# Patient Record
Sex: Female | Born: 1950
Health system: Southern US, Community
[De-identification: ages and names within clinical notes are randomized; demographics above are authoritative.]

## PROBLEM LIST (undated history)

## (undated) DIAGNOSIS — M1990 Unspecified osteoarthritis, unspecified site: Secondary | ICD-10-CM

## (undated) DIAGNOSIS — I10 Essential (primary) hypertension: Secondary | ICD-10-CM

## (undated) DIAGNOSIS — T7840XA Allergy, unspecified, initial encounter: Secondary | ICD-10-CM

## (undated) DIAGNOSIS — B029 Zoster without complications: Secondary | ICD-10-CM

## (undated) DIAGNOSIS — Z923 Personal history of irradiation: Secondary | ICD-10-CM

## (undated) DIAGNOSIS — Z86010 Personal history of colonic polyps: Secondary | ICD-10-CM

## (undated) DIAGNOSIS — M7120 Synovial cyst of popliteal space [Baker], unspecified knee: Secondary | ICD-10-CM

## (undated) DIAGNOSIS — E739 Lactose intolerance, unspecified: Secondary | ICD-10-CM

## (undated) DIAGNOSIS — C50919 Malignant neoplasm of unspecified site of unspecified female breast: Secondary | ICD-10-CM

## (undated) HISTORY — PX: ABDOMINAL HYSTERECTOMY: SHX81

## (undated) HISTORY — DX: Allergy, unspecified, initial encounter: T78.40XA

## (undated) HISTORY — DX: Zoster without complications: B02.9

## (undated) HISTORY — DX: Essential (primary) hypertension: I10

## (undated) HISTORY — DX: Synovial cyst of popliteal space (Baker), unspecified knee: M71.20

## (undated) HISTORY — DX: Unspecified osteoarthritis, unspecified site: M19.90

## (undated) HISTORY — DX: Personal history of colonic polyps: Z86.010

## (undated) HISTORY — DX: Lactose intolerance, unspecified: E73.9

## (undated) HISTORY — PX: OTHER SURGICAL HISTORY: SHX169

## (undated) HISTORY — DX: Malignant neoplasm of unspecified site of unspecified female breast: C50.919

---

## 1998-05-17 ENCOUNTER — Emergency Department (HOSPITAL_COMMUNITY): Admission: EM | Admit: 1998-05-17 | Discharge: 1998-05-17 | Payer: Self-pay | Admitting: Emergency Medicine

## 1999-02-18 ENCOUNTER — Ambulatory Visit (HOSPITAL_COMMUNITY): Admission: RE | Admit: 1999-02-18 | Discharge: 1999-02-18 | Payer: Self-pay | Admitting: Obstetrics

## 1999-02-18 ENCOUNTER — Encounter: Payer: Self-pay | Admitting: Internal Medicine

## 2000-02-23 ENCOUNTER — Encounter: Payer: Self-pay | Admitting: Internal Medicine

## 2000-02-23 ENCOUNTER — Ambulatory Visit (HOSPITAL_COMMUNITY): Admission: RE | Admit: 2000-02-23 | Discharge: 2000-02-23 | Payer: Self-pay | Admitting: Internal Medicine

## 2001-02-26 ENCOUNTER — Encounter: Payer: Self-pay | Admitting: Internal Medicine

## 2001-02-26 ENCOUNTER — Ambulatory Visit (HOSPITAL_COMMUNITY): Admission: RE | Admit: 2001-02-26 | Discharge: 2001-02-26 | Payer: Self-pay | Admitting: Internal Medicine

## 2002-02-27 ENCOUNTER — Encounter: Payer: Self-pay | Admitting: Internal Medicine

## 2002-02-27 ENCOUNTER — Ambulatory Visit (HOSPITAL_COMMUNITY): Admission: RE | Admit: 2002-02-27 | Discharge: 2002-02-27 | Payer: Self-pay | Admitting: Internal Medicine

## 2003-03-03 ENCOUNTER — Encounter: Payer: Self-pay | Admitting: Internal Medicine

## 2003-03-03 ENCOUNTER — Ambulatory Visit (HOSPITAL_COMMUNITY): Admission: RE | Admit: 2003-03-03 | Discharge: 2003-03-03 | Payer: Self-pay | Admitting: Geriatric Medicine

## 2004-03-08 ENCOUNTER — Ambulatory Visit (HOSPITAL_COMMUNITY): Admission: RE | Admit: 2004-03-08 | Discharge: 2004-03-08 | Payer: Self-pay | Admitting: Internal Medicine

## 2004-11-10 ENCOUNTER — Ambulatory Visit: Payer: Self-pay | Admitting: Internal Medicine

## 2005-03-31 ENCOUNTER — Ambulatory Visit (HOSPITAL_COMMUNITY): Admission: RE | Admit: 2005-03-31 | Discharge: 2005-03-31 | Payer: Self-pay | Admitting: Internal Medicine

## 2005-10-20 ENCOUNTER — Ambulatory Visit: Payer: Self-pay | Admitting: Internal Medicine

## 2005-10-26 ENCOUNTER — Ambulatory Visit: Payer: Self-pay | Admitting: Internal Medicine

## 2005-11-11 ENCOUNTER — Emergency Department (HOSPITAL_COMMUNITY): Admission: EM | Admit: 2005-11-11 | Discharge: 2005-11-11 | Payer: Self-pay | Admitting: Family Medicine

## 2005-11-21 ENCOUNTER — Ambulatory Visit: Payer: Self-pay | Admitting: Internal Medicine

## 2005-11-28 ENCOUNTER — Ambulatory Visit: Payer: Self-pay | Admitting: Internal Medicine

## 2005-12-11 ENCOUNTER — Encounter (INDEPENDENT_AMBULATORY_CARE_PROVIDER_SITE_OTHER): Payer: Self-pay | Admitting: Specialist

## 2005-12-11 ENCOUNTER — Ambulatory Visit: Payer: Self-pay | Admitting: Internal Medicine

## 2005-12-11 HISTORY — PX: COLONOSCOPY: SHX174

## 2006-04-02 ENCOUNTER — Ambulatory Visit (HOSPITAL_COMMUNITY): Admission: RE | Admit: 2006-04-02 | Discharge: 2006-04-02 | Payer: Self-pay | Admitting: Internal Medicine

## 2006-08-10 ENCOUNTER — Ambulatory Visit: Payer: Self-pay | Admitting: Internal Medicine

## 2006-10-12 ENCOUNTER — Ambulatory Visit: Payer: Self-pay | Admitting: Internal Medicine

## 2007-04-04 ENCOUNTER — Ambulatory Visit (HOSPITAL_COMMUNITY): Admission: RE | Admit: 2007-04-04 | Discharge: 2007-04-04 | Payer: Self-pay | Admitting: Internal Medicine

## 2007-06-06 ENCOUNTER — Ambulatory Visit: Payer: Self-pay | Admitting: Internal Medicine

## 2007-06-07 ENCOUNTER — Telehealth: Payer: Self-pay | Admitting: Internal Medicine

## 2007-06-07 LAB — CONVERTED CEMR LAB
BUN: 9 mg/dL (ref 6–23)
Creatinine, Ser: 0.8 mg/dL (ref 0.4–1.2)
Potassium: 3.6 meq/L (ref 3.5–5.1)

## 2007-08-19 ENCOUNTER — Emergency Department (HOSPITAL_COMMUNITY): Admission: EM | Admit: 2007-08-19 | Discharge: 2007-08-19 | Payer: Self-pay | Admitting: Family Medicine

## 2008-01-24 ENCOUNTER — Ambulatory Visit: Payer: Self-pay | Admitting: Internal Medicine

## 2008-01-24 DIAGNOSIS — I10 Essential (primary) hypertension: Secondary | ICD-10-CM | POA: Insufficient documentation

## 2008-03-26 ENCOUNTER — Telehealth (INDEPENDENT_AMBULATORY_CARE_PROVIDER_SITE_OTHER): Payer: Self-pay | Admitting: *Deleted

## 2008-04-06 ENCOUNTER — Ambulatory Visit (HOSPITAL_COMMUNITY): Admission: RE | Admit: 2008-04-06 | Discharge: 2008-04-06 | Payer: Self-pay | Admitting: Internal Medicine

## 2008-04-09 ENCOUNTER — Ambulatory Visit: Payer: Self-pay | Admitting: Internal Medicine

## 2008-04-09 LAB — CONVERTED CEMR LAB: OCCULT 1: NEGATIVE

## 2008-05-01 ENCOUNTER — Telehealth (INDEPENDENT_AMBULATORY_CARE_PROVIDER_SITE_OTHER): Payer: Self-pay | Admitting: *Deleted

## 2008-05-04 ENCOUNTER — Telehealth (INDEPENDENT_AMBULATORY_CARE_PROVIDER_SITE_OTHER): Payer: Self-pay | Admitting: *Deleted

## 2008-06-18 ENCOUNTER — Emergency Department (HOSPITAL_COMMUNITY): Admission: EM | Admit: 2008-06-18 | Discharge: 2008-06-18 | Payer: Self-pay | Admitting: Emergency Medicine

## 2008-10-24 DIAGNOSIS — B029 Zoster without complications: Secondary | ICD-10-CM

## 2008-10-24 HISTORY — DX: Zoster without complications: B02.9

## 2008-10-28 ENCOUNTER — Ambulatory Visit: Payer: Self-pay | Admitting: Internal Medicine

## 2008-10-28 DIAGNOSIS — Z8619 Personal history of other infectious and parasitic diseases: Secondary | ICD-10-CM | POA: Insufficient documentation

## 2008-10-29 ENCOUNTER — Telehealth (INDEPENDENT_AMBULATORY_CARE_PROVIDER_SITE_OTHER): Payer: Self-pay | Admitting: *Deleted

## 2008-11-03 ENCOUNTER — Telehealth (INDEPENDENT_AMBULATORY_CARE_PROVIDER_SITE_OTHER): Payer: Self-pay | Admitting: *Deleted

## 2008-11-05 ENCOUNTER — Telehealth (INDEPENDENT_AMBULATORY_CARE_PROVIDER_SITE_OTHER): Payer: Self-pay | Admitting: *Deleted

## 2008-11-10 ENCOUNTER — Telehealth (INDEPENDENT_AMBULATORY_CARE_PROVIDER_SITE_OTHER): Payer: Self-pay | Admitting: *Deleted

## 2009-04-07 ENCOUNTER — Ambulatory Visit (HOSPITAL_COMMUNITY): Admission: RE | Admit: 2009-04-07 | Discharge: 2009-04-07 | Payer: Self-pay | Admitting: Internal Medicine

## 2009-04-14 ENCOUNTER — Encounter: Admission: RE | Admit: 2009-04-14 | Discharge: 2009-04-14 | Payer: Self-pay | Admitting: Internal Medicine

## 2009-10-27 ENCOUNTER — Encounter (INDEPENDENT_AMBULATORY_CARE_PROVIDER_SITE_OTHER): Payer: Self-pay | Admitting: *Deleted

## 2009-11-18 ENCOUNTER — Ambulatory Visit: Payer: Self-pay | Admitting: Internal Medicine

## 2009-11-18 DIAGNOSIS — J309 Allergic rhinitis, unspecified: Secondary | ICD-10-CM | POA: Insufficient documentation

## 2009-11-18 DIAGNOSIS — E739 Lactose intolerance, unspecified: Secondary | ICD-10-CM | POA: Insufficient documentation

## 2010-04-07 ENCOUNTER — Ambulatory Visit: Payer: Self-pay | Admitting: Internal Medicine

## 2010-04-07 DIAGNOSIS — K219 Gastro-esophageal reflux disease without esophagitis: Secondary | ICD-10-CM | POA: Insufficient documentation

## 2010-04-07 DIAGNOSIS — R143 Flatulence: Secondary | ICD-10-CM

## 2010-04-07 DIAGNOSIS — R141 Gas pain: Secondary | ICD-10-CM | POA: Insufficient documentation

## 2010-04-07 DIAGNOSIS — R142 Eructation: Secondary | ICD-10-CM

## 2010-04-22 ENCOUNTER — Ambulatory Visit (HOSPITAL_COMMUNITY): Admission: RE | Admit: 2010-04-22 | Discharge: 2010-04-22 | Payer: Self-pay | Admitting: Internal Medicine

## 2010-04-28 ENCOUNTER — Encounter: Payer: Self-pay | Admitting: Internal Medicine

## 2010-05-05 ENCOUNTER — Encounter: Admission: RE | Admit: 2010-05-05 | Discharge: 2010-05-05 | Payer: Self-pay | Admitting: Internal Medicine

## 2010-07-16 ENCOUNTER — Encounter: Payer: Self-pay | Admitting: Internal Medicine

## 2010-07-17 ENCOUNTER — Encounter: Payer: Self-pay | Admitting: Internal Medicine

## 2010-07-26 NOTE — Assessment & Plan Note (Signed)
Summary: bloated/gas/cbs   Vital Signs:  Patient profile:   60 year old female Weight:      181.8 pounds BMI:     29.23 Temp:     98.1 degrees F oral Pulse rate:   64 / minute Resp:     15 per minute BP sitting:   124 / 80  (left arm) Cuff size:   large  Vitals Entered By: Shonna Chock CMA (April 07, 2010 2:02 PM) CC: Bloating and Gas, Abdominal pain, Heartburn   CC:  Bloating and Gas, Abdominal pain, and Heartburn.  History of Present Illness:      This is a 60 year old woman who presents with bloating  2 weeks ago followed by  residual " gas  & indigestion"  X 2 weeks intermittently.  The patient denies nausea, vomiting, diarrhea, constipation, melena, anorexia, and hematemesis.  The location of the bloating  is in lower quadrants with back radiation. It will also radiate into  epigastrium & SS area. Weight loss has ben purposeful with CVE & W Ws.  The patient denies the following symptoms: fever, dysuria, chest pain, dark urine, and vaginal bleeding.  The bloating is worse with food intake late @ night.  The pain is  no better with antacids or Zantac OTC. PMH of TAH; ovaries remain. Last Gyn appt 2-3 years ago.  The patient denies sour taste in mouth or frank ERD . Gas symptoms  worse with salads & dairy .  Current Medications (verified): 1)  Cartia Xt 120 Mg  Cp24 (Diltiazem Hcl Coated Beads) .Marland Kitchen.. 1 Q D  Allergies: 1)  ! * Septra Ds  Review of Systems ENT:  Denies difficulty swallowing and hoarseness.  Physical Exam  General:  in no acute distress; alert,appropriate and cooperative throughout examination Eyes:  No corneal or conjunctival inflammation noted. No icterus Mouth:  Oral mucosa and oropharynx without lesions or exudates.  Mild pharyngeal erythema.   Lungs:  Normal respiratory effort, chest expands symmetrically. Lungs are clear to auscultation, no crackles or wheezes. Heart:  Normal rate and regular rhythm. S1 and S2 normal without gallop, murmur, click, rub .S 4  with slurring Abdomen:  Bowel sounds positive,abdomen soft and non-tender without masses, organomegaly or hernias noted. Skin:  Intact without suspicious lesions or rashes Cervical Nodes:  No lymphadenopathy noted Axillary Nodes:  No palpable lymphadenopathy Psych:  memory intact for recent and remote, normally interactive, and good eye contact.     Impression & Recommendations:  Problem # 1:  GERD (ICD-530.81)  as "gas"  Her updated medication list for this problem includes:    Omeprazole 20 Mg Cpdr (Omeprazole) .Marland Kitchen... 1 once daily 30 min pre b'fast    Hyoscyamine Sulfate 0.125 Mg Subl (Hyoscyamine sulfate) .Marland Kitchen... 1 under tongue every 6 hrs as needed for gas  Problem # 2:  ABDOMINAL BLOATING (ICD-787.3) resolved  Complete Medication List: 1)  Cartia Xt 120 Mg Cp24 (Diltiazem hcl coated beads) .Marland Kitchen.. 1 q d 2)  Omeprazole 20 Mg Cpdr (Omeprazole) .Marland Kitchen.. 1 once daily 30 min pre b'fast 3)  Hyoscyamine Sulfate 0.125 Mg Subl (Hyoscyamine sulfate) .Marland Kitchen.. 1 under tongue every 6 hrs as needed for gas  Patient Instructions: 1)  Avoid foods high in acid (tomatoes, citrus juices, spicy foods). Avoid eating within two hours of lying down or before exercising. Do not over eat; try smaller more frequent meals. Elevate head of bed twelve inches when sleeping.Please see a Gynecologist to monitor ovarian health. Prescriptions: HYOSCYAMINE SULFATE 0.125  MG SUBL (HYOSCYAMINE SULFATE) 1 under tongue every 6 hrs as needed for gas  #30 x 0   Entered and Authorized by:   Marga Melnick MD   Signed by:   Marga Melnick MD on 04/07/2010   Method used:   Faxed to ...       [facility]* (retail)       [address]       [address] Shipping/mailing       [place], [place]  [address]       Ph: [phone]       Fax: [phone]   RxID:   [id number] OMEPRAZOLE 20 MG CPDR (OMEPRAZOLE) 1 once daily 30 min pre b'fast  #30 x 1   Entered and Authorized by:   Marga Melnick MD   Signed by:    Marga Melnick MD on 04/07/2010   Method used:   Faxed to ...       [facility]* (retail)       [address]       [address] Shipping/mailing       [place], [place]  [address]       Ph: [phone]       Fax: [phone]   RxID:   [id number]

## 2010-07-26 NOTE — Letter (Signed)
Summary: Primary Care Appointment Letter  [facility] [place]  [address] [place], [place] [address]   Phone: [phone]  Fax: [phone]    10/27/2009 MRN: [id number]  Angie Garcia [address] [place], [place]  [address]  Dear [name],   Your Primary Care Physician Marga Melnick MD has indicated that:    __X_____it is time to schedule an appointment.    _______you missed your appointment on______ and need to call and          reschedule.    _______you need to have lab work done.    _______you need to schedule an appointment discuss lab or test results.    _______you need to call to reschedule your appointment that is                       scheduled on _________.     Please call our office as soon as possible. Our phone number is [phone]. Please press option 1. Our office is open 8a-12noon and 1p-5p, Monday through Friday.     Thank you,    [facility]

## 2010-07-26 NOTE — Assessment & Plan Note (Signed)
Summary: med refill/cbs   Vital Signs:  Patient profile:   60 year old female Height:      66.25 inches Weight:      187.8 pounds BMI:     30.19 Temp:     97.4 degrees F oral Pulse rate:   60 / minute Resp:     14 per minute BP sitting:   126 / 80  (right arm) Cuff size:   large  Vitals Entered By: Shonna Chock (Nov 18, 2009 11:15 AM)  CC: CPX: Not fasting today, General Medical Evaluation Comments REVIEWED MED LIST, PATIENT AGREED DOSE AND INSTRUCTION CORRECT    CC:  CPX: Not fasting today and General Medical Evaluation.  History of Present Illness: Angie Garcia is here for a physical ; she is asymptomatic.  Preventive Screening-Counseling & Management  Caffeine-Diet-Exercise     Does Patient Exercise: yes  Allergies: 1)  ! * Septra Ds  Past History:  Past Medical History: Hypertension Shingles , PMH of , LUE 10/2008; Lactose intolerance  Past Surgical History: Pilonidal cystectomy Hysterectomy for fibroids gravida one para one Colonoscopy negative 2005  Family History: Father: diabetes, mini CVA'S  Mother:htn, breast cancer Siblings: sister: cva @ 73, diabetes maternal uncle: cancer unknown primary  Social History: Former Smoker,quit age 41 Occupation:File Room Assembler/ Scheduler Married Alcohol use-no Regular exercise-yes: treadmill & walking Does Patient Exercise:  yes  Review of Systems  The patient denies anorexia, fever, weight loss, weight gain, vision loss, decreased hearing, hoarseness, chest pain, syncope, dyspnea on exertion, peripheral edema, prolonged cough, headaches, hemoptysis, abdominal pain, melena, hematochezia, severe indigestion/heartburn, hematuria, incontinence, suspicious skin lesions, depression, unusual weight change, abnormal bleeding, enlarged lymph nodes, and angioedema.         Bloating only with lactose intake. Extrinsic rhinitis ; Allegra as needed with benefit.  Physical Exam  General:  well-nourished;  alert,appropriate and cooperative throughout examination Head:  Normocephalic and atraumatic without obvious abnormalities.  Eyes:  No corneal or conjunctival inflammation noted.Perrla. Funduscopic exam benign, without hemorrhages, exudates or papilledema.  Ears:  External ear exam shows no significant lesions or deformities.  Otoscopic examination reveals clear canals, tympanic membranes are intact bilaterally without bulging, retraction, inflammation or discharge. Hearing is grossly normal bilaterally. Nose:  External nasal examination shows no deformity or inflammation. Nasal mucosa are pink and moist without lesions or exudates. Mouth:  Oral mucosa and oropharynx without lesions or exudates.  Upper plate Neck:  No deformities, masses, or tenderness noted. Lungs:  Normal respiratory effort, chest expands symmetrically. Lungs are clear to auscultation, no crackles or wheezes. Heart:  regular rhythm, no gallop, no rub, no JVD, no HJR, bradycardia, and grade 1 /6 systolic murmur.   Abdomen:  Bowel sounds positive,abdomen soft and non-tender without masses, organomegaly or hernias noted. Msk:  No deformity or scoliosis noted of thoracic or lumbar spine.   Pulses:  R and L carotid,radial,dorsalis pedis and posterior tibial pulses are full and equal bilaterally Extremities:  No clubbing, cyanosis, edema, or deformity noted with normal full range of motion of all joints.   Crepitus of knees , L > R Neurologic:  alert & oriented X3 and DTRs symmetrical  but 0-1/2+ @ knees Skin:  Intact without suspicious lesions or rashes Cervical Nodes:  No lymphadenopathy noted Axillary Nodes:  No palpable lymphadenopathy Psych:  memory intact for recent and remote, normally interactive, and good eye contact.     Impression & Recommendations:  Problem # 1:  ROUTINE GENERAL MEDICAL EXAM@HEALTH  CARE FACL (  ICD-V70.0)  Orders: EKG w/ Interpretation ([address])  Problem # 2:  HYPERTENSION  (ICD-401.9)  Controlled Her updated medication list for this problem includes:    Cartia Xt 120 Mg Cp24 (Diltiazem hcl coated beads) .Marland Kitchen... 1 q d  Orders: EKG w/ Interpretation ([address])  Problem # 3:  RHINITIS (ICD-477.9)  Her updated medication list for this problem includes:    Fluticasone Propionate 50 Mcg/act Susp (Fluticasone propionate) .Marland Kitchen... 1 spray two times a day as needed allergies  Problem # 4:  LACTOSE INTOLERANCE (ICD-271.3)  Complete Medication List: 1)  Cartia Xt 120 Mg Cp24 (Diltiazem hcl coated beads) .Marland Kitchen.. 1 q d 2)  Fluticasone Propionate 50 Mcg/act Susp (Fluticasone propionate) .Marland Kitchen.. 1 spray two times a day as needed allergies  Patient Instructions: 1)  Use a Neti pot once daily as needed for nasal congestion.Consider fasting labs; see Diagnoses for Codes: 2)  BMP prior to visit, ICD-9: 3)  Hepatic Panel prior to visit, ICD-9: 4)  Lipid Panel prior to visit, ICD-9: 5)  TSH prior to visit, ICD-9: 6)  CBC w/ Diff prior to visit, ICD-9: Prescriptions: FLUTICASONE PROPIONATE 50 MCG/ACT SUSP (FLUTICASONE PROPIONATE) 1 spray two times a day as needed allergies  #1 x 11   Entered and Authorized by:   Marga Melnick MD   Signed by:   Marga Melnick MD on 11/18/2009   Method used:   Faxed to ...       [facility]* (retail)       [address]       [address] Shipping/mailing       [place], [place]  [address]       Ph: [phone]       Fax: [phone]   RxID:   [id number] CARTIA XT 120 MG  CP24 (DILTIAZEM HCL COATED BEADS) 1 q d  #90 x 3   Entered and Authorized by:   Marga Melnick MD   Signed by:   Marga Melnick MD on 11/18/2009   Method used:   Faxed to ...       [facility]* (retail)       [address]       [address] Shipping/mailing       [place], [place]  [address]       Ph: [phone]       Fax: [phone]   RxID:   [id number]

## 2010-11-29 ENCOUNTER — Other Ambulatory Visit: Payer: Self-pay | Admitting: Internal Medicine

## 2010-12-20 ENCOUNTER — Other Ambulatory Visit (HOSPITAL_BASED_OUTPATIENT_CLINIC_OR_DEPARTMENT_OTHER): Payer: Self-pay | Admitting: Urology

## 2011-03-21 ENCOUNTER — Other Ambulatory Visit: Payer: Self-pay | Admitting: Internal Medicine

## 2011-03-21 DIAGNOSIS — Z1231 Encounter for screening mammogram for malignant neoplasm of breast: Secondary | ICD-10-CM

## 2011-03-29 ENCOUNTER — Encounter: Payer: Self-pay | Admitting: *Deleted

## 2011-03-29 ENCOUNTER — Encounter: Payer: Self-pay | Admitting: Internal Medicine

## 2011-03-29 ENCOUNTER — Ambulatory Visit (INDEPENDENT_AMBULATORY_CARE_PROVIDER_SITE_OTHER): Payer: 59 | Admitting: Internal Medicine

## 2011-03-29 VITALS — BP 138/98 | HR 67 | Temp 97.7°F | Wt 176.0 lb

## 2011-03-29 DIAGNOSIS — I10 Essential (primary) hypertension: Secondary | ICD-10-CM

## 2011-03-29 MED ORDER — LISINOPRIL 20 MG PO TABS: 20.0000 mg | ORAL_TABLET | Freq: Every day | ORAL | Status: DC

## 2011-03-29 NOTE — Progress Notes (Signed)
  Subjective:    Patient ID: Angie Garcia, female    DOB: [birth year], 60 y.o.   MRN: [id number]  HPI  HYPERTENSION: exacerbation in past 6 days  In context of decongestant for URI Disease Monitoring  Blood pressure range: 142/80-171/91  Chest pain: no   Dyspnea: no   Claudication: no   Medication compliance: yes  Medication Side Effects  Lightheadedness: no   Urinary frequency: no   Edema: no      Preventitive Healthcare:  Exercise: yes, 30 min daily   Diet Pattern: no plan   Salt Restriction: yes      Review of Systems   At this time she denies frontal headache, nasal purulence, facial pain, fever, chills or sweats.     Objective:   Physical Exam  She appears healthy and well-nourished  The nares are patent with no exudate. She has no significant oral pharyngeal erythema.  Chest is clear with no rhonchi or rales.  She exhibits a slow S4 with no murmurs or gallops.  She has no clubbing, edema or cyanosis.  There is no lymphadenopathy the head, neck or axilla.       Assessment & Plan:  #1 hypertension, possible exacerbation by decongestants taken for an upper respiratory infection. She is presently on a low dose calcium channel blocker  #2 recent upper respiratory tract symptoms without evidence of rhinosinusitis Plan: See orders and recommendations

## 2011-03-29 NOTE — Patient Instructions (Signed)
Your BP goal = AVERAGE < 135/85. Avoid ingestion of  excess salt/sodium.Cook with pepper & other spices . Use the salt substitute "No Salt"(unless your potassium has been elevated) OR the Mrs Sharilyn Sites products to season food @ the table. Avoid foods which taste salty or "vinegary" as their sodium contentet will be high. This AVERAGE should be calculated from @ least 5-7 BP readings taken @ different times of day on different days of week. You should not respond to isolated BP readings , but rather the AVERAGE for that week

## 2011-03-31 ENCOUNTER — Other Ambulatory Visit: Payer: Self-pay | Admitting: Internal Medicine

## 2011-03-31 DIAGNOSIS — Z1231 Encounter for screening mammogram for malignant neoplasm of breast: Secondary | ICD-10-CM

## 2011-04-28 ENCOUNTER — Ambulatory Visit
Admission: RE | Admit: 2011-04-28 | Discharge: 2011-04-28 | Disposition: A | Discharge status: Home / Self Care | Payer: 59 | Source: Ambulatory Visit | Attending: Internal Medicine | Admitting: Internal Medicine

## 2011-04-28 ENCOUNTER — Ambulatory Visit: Payer: 59

## 2011-04-28 ENCOUNTER — Ambulatory Visit (HOSPITAL_COMMUNITY): Payer: 59

## 2011-04-28 DIAGNOSIS — Z1231 Encounter for screening mammogram for malignant neoplasm of breast: Secondary | ICD-10-CM

## 2011-05-22 ENCOUNTER — Emergency Department (INDEPENDENT_AMBULATORY_CARE_PROVIDER_SITE_OTHER)
Admission: EM | Admit: 2011-05-22 | Discharge: 2011-05-22 | Disposition: A | Discharge status: Home / Self Care | Payer: 59 | Source: Home / Self Care

## 2011-05-22 ENCOUNTER — Encounter (HOSPITAL_COMMUNITY): Payer: Self-pay

## 2011-05-22 DIAGNOSIS — N390 Urinary tract infection, site not specified: Secondary | ICD-10-CM

## 2011-05-22 LAB — POCT I-STAT, CHEM 8
Glucose, Bld: 126 mg/dL — ABNORMAL HIGH (ref 70–99)
HCT: 43 % (ref 36.0–46.0)
Hemoglobin: 14.6 g/dL (ref 12.0–15.0)
Potassium: 4 mEq/L (ref 3.5–5.1)
TCO2: 25 mmol/L (ref 0–100)

## 2011-05-22 LAB — POCT URINALYSIS DIP (DEVICE)
Ketones, ur: NEGATIVE mg/dL
Leukocytes, UA: NEGATIVE
Nitrite: POSITIVE — AB
Protein, ur: NEGATIVE mg/dL
Urobilinogen, UA: 1 mg/dL (ref 0.0–1.0)

## 2011-05-22 MED ORDER — ONDANSETRON HCL 4 MG PO TABS: 4.0000 mg | ORAL_TABLET | Freq: Four times a day (QID) | ORAL | Status: AC

## 2011-05-22 MED ORDER — CIPROFLOXACIN HCL 500 MG PO TABS: 500.0000 mg | ORAL_TABLET | Freq: Two times a day (BID) | ORAL | Status: AC

## 2011-05-22 NOTE — ED Notes (Signed)
Today suddenly pt. started having stomach pains along with nausea and vomitting.  Tried to take maalox and she did not vomitt this back up.  However the pain is still there.  Tried to drink ginger ale and she vomitted that as well.

## 2011-05-22 NOTE — Discharge Instructions (Signed)
Is possible you have a urinary infection although very mild finding on your urine test. Take the prescribed medications as instructed. Keep well hydrated. Can take miralax 1 cap full mixed with 8oz of Gatorade if constipation. Go to the ED if recurrent worsening abdominal pain or not keeping fluids down.    Urinary Tract Infection Infections of the urinary tract can start in several places. A bladder infection (cystitis), a kidney infection (pyelonephritis), and a prostate infection (prostatitis) are different types of urinary tract infections (UTIs). They usually get better if treated with medicines (antibiotics) that kill germs. Take all the medicine until it is gone. You or your child may feel better in a few days, but TAKE ALL MEDICINE or the infection may not respond and may become more difficult to treat. HOME CARE INSTRUCTIONS   Drink enough water and fluids to keep the urine clear or pale yellow. Cranberry juice is especially recommended, in addition to large amounts of water.   Avoid caffeine, tea, and carbonated beverages. They tend to irritate the bladder.   Alcohol may irritate the prostate.   Only take over-the-counter or prescription medicines for pain, discomfort, or fever as directed by your caregiver.  To prevent further infections:  Empty the bladder often. Avoid holding urine for long periods of time.   After a bowel movement, women should cleanse from front to back. Use each tissue only once.   Empty the bladder before and after sexual intercourse.  FINDING OUT THE RESULTS OF YOUR TEST Not all test results are available during your visit. If your or your child's test results are not back during the visit, make an appointment with your caregiver to find out the results. Do not assume everything is normal if you have not heard from your caregiver or the medical facility. It is important for you to follow up on all test results. SEEK MEDICAL CARE IF:   There is back  pain.   Your baby is older than 3 months with a rectal temperature of 100.5 F (38.1 C) or higher for more than 1 day.   Your or your child's problems (symptoms) are no better in 3 days. Return sooner if you or your child is getting worse.  SEEK IMMEDIATE MEDICAL CARE IF:   There is severe back pain or lower abdominal pain.   You or your child develops chills.   You have a fever.   Your baby is older than 3 months with a rectal temperature of 102 F (38.9 C) or higher.   Your baby is 3 months old or younger with a rectal temperature of 100.4 F (38 C) or higher.   There is nausea or vomiting.   There is continued burning or discomfort with urination.  MAKE SURE YOU:   Understand these instructions.   Will watch your condition.   Will get help right away if you are not doing well or get worse.  Document Released: 03/22/2005 Document Revised: 02/22/2011 Document Reviewed: 10/25/2006 [facility] Patient Information 2012 [place], [place].

## 2011-05-22 NOTE — ED Provider Notes (Signed)
History     CSN: [id number] Arrival date & time: 05/22/2011  3:41 PM   First MD Initiated Contact with Patient 05/22/11 1615      Chief Complaint  Patient presents with  . Abdominal Pain    Today suddenly pt. started having stomach pains along with nausea and vomitting.  Tried to take maalox and she did not vomitt this back up.  However the pain is still there.      (Consider location/radiation/quality/duration/timing/severity/associated sxs/prior treatment) Patient is a 60 y.o. female presenting with abdominal pain. The history is provided by the patient.  Abdominal Pain The primary symptoms of the illness include abdominal pain, nausea and vomiting. The primary symptoms of the illness do not include fever, fatigue, shortness of breath, diarrhea, hematemesis, hematochezia or dysuria. The current episode started 6 to 12 hours ago. The onset of the illness was sudden. The problem has been gradually improving.  The vomiting began today. Vomiting occurs 2 to 5 times per day. The emesis contains stomach contents.  The illness is associated with eating (eat a lot of food around thanksgiving). The patient has not had a change in bowel habit (last bowel movement 3 days ago not unusual for patient to go every 3-5 days.). Additional symptoms associated with the illness include heartburn. Symptoms associated with the illness do not include chills, anorexia, diaphoresis, constipation, hematuria, frequency or back pain.    Past Medical History  Diagnosis Date  . Hypertension   . Shingles 10-2008  . Lactose intolerance     Past Surgical History  Procedure Date  . Abdominal hysterectomy   . G1 p1    . Pilonidal cystectomy     Family History  Problem Relation Age of Onset  . Hypertension Mother   . Cancer Mother     BREAST  . Diabetes Father   . Stroke Father     MINI STROKES  . Stroke Sister   . Diabetes Sister   . Cancer Maternal Uncle     UNKNOWN TYPE    History  Substance Use  Topics  . Smoking status: Former Games developer  . Smokeless tobacco: Never Used   Comment: QUIT @ 21  . Alcohol Use: No    OB History    Grav Para Term Preterm Abortions TAB SAB Ect Mult Living                  Review of Systems  Constitutional: Positive for appetite change. Negative for fever, chills, diaphoresis and fatigue.  HENT: Negative for congestion and sore throat.   Respiratory: Negative for cough and shortness of breath.   Gastrointestinal: Positive for heartburn, nausea, vomiting and abdominal pain. Negative for diarrhea, constipation, hematochezia, anorexia and hematemesis.  Genitourinary: Negative for dysuria, frequency and hematuria.  Musculoskeletal: Negative for back pain.  Neurological: Negative for light-headedness and headaches.    Allergies  Sulfamethoxazole w/trimethoprim  Home Medications   Current Outpatient Rx  Name Route Sig Dispense Refill  . VITAMIN C 100 MG PO TABS Oral Take 100 mg by mouth daily.      Marland Kitchen DILTIAZEM HCL COATED BEADS 120 MG PO CP24  TAKE 1 CAPSULE BY MOUTH ONCE DAILY 90 capsule 1  . LISINOPRIL 20 MG PO TABS Oral Take 1 tablet (20 mg total) by mouth daily. Report any swelling the lips tongue immediately 30 tablet 5  . ONE-DAILY MULTI VITAMINS PO TABS Oral Take 1 tablet by mouth daily.      Marland Kitchen CIPROFLOXACIN HCL 500 MG  PO TABS Oral Take 1 tablet (500 mg total) by mouth every 12 (twelve) hours. 10 tablet 0  . ONDANSETRON HCL 4 MG PO TABS Oral Take 1 tablet (4 mg total) by mouth every 6 (six) hours. 12 tablet 0    BP 148/74  Pulse 71  Temp(Src) 98.5 F (36.9 C) (Oral)  Resp 16  SpO2 98%  Physical Exam  Nursing note and vitals reviewed. Constitutional: She is oriented to person, place, and time. She appears well-developed and well-nourished. No distress.  HENT:  Head: Normocephalic.  Right Ear: External ear normal.  Left Ear: External ear normal.  Nose: Nose normal.  Mouth/Throat: Oropharynx is clear and moist.  Eyes: Conjunctivae and  EOM are normal. Pupils are equal, round, and reactive to light. No scleral icterus.  Cardiovascular: Normal rate, regular rhythm and normal heart sounds.   Pulmonary/Chest: Breath sounds normal. No respiratory distress. She has no wheezes. She has no rales.  Abdominal: Soft. Bowel sounds are normal. She exhibits no distension and no mass. There is no rebound and no guarding.       Mild diffused tenderness  Genitourinary:       No CVT  Neurological: She is alert and oriented to person, place, and time.  Skin: Skin is warm. No rash noted.    ED Course  Procedures (including critical care time)  Labs Reviewed  POCT I-STAT, CHEM 8 - Abnormal; Notable for the following:    Glucose, Bld 126 (*)    All other components within normal limits  POCT URINALYSIS DIP (DEVICE) - Abnormal; Notable for the following:    pH 8.5 (*)    Nitrite POSITIVE (*)    All other components within normal limits  LIPASE, BLOOD  LAB REPORT - SCANNED   No results found.   1. UTI (lower urinary tract infection)       MDM  Lower non complicated UTI vs Gastroenteric related disease. Decided to cover with Cipro, pt. clinically well on exam. No active emesis.        Sharin Grave, MD 05/25/11 1146

## 2011-06-05 ENCOUNTER — Other Ambulatory Visit: Payer: Self-pay | Admitting: Internal Medicine

## 2011-06-16 ENCOUNTER — Ambulatory Visit (INDEPENDENT_AMBULATORY_CARE_PROVIDER_SITE_OTHER): Payer: 59 | Admitting: Internal Medicine

## 2011-06-16 ENCOUNTER — Encounter: Payer: Self-pay | Admitting: Internal Medicine

## 2011-06-16 VITALS — BP 134/82 | HR 74 | Temp 98.2°F | Wt 175.6 lb

## 2011-06-16 DIAGNOSIS — K625 Hemorrhage of anus and rectum: Secondary | ICD-10-CM

## 2011-06-16 LAB — CBC WITH DIFFERENTIAL/PLATELET
Basophils Absolute: 0 10*3/uL (ref 0.0–0.1)
Eosinophils Relative: 1.4 % (ref 0.0–5.0)
Lymphocytes Relative: 29.3 % (ref 12.0–46.0)
Monocytes Relative: 6.5 % (ref 3.0–12.0)
Neutrophils Relative %: 62.2 % (ref 43.0–77.0)
Platelets: 356 10*3/uL (ref 150.0–400.0)
WBC: 6.4 10*3/uL (ref 4.5–10.5)

## 2011-06-16 NOTE — Progress Notes (Signed)
  Subjective:    Patient ID: Angie Garcia, female    DOB: 04/02/1951, 60 y.o.   MRN: [id number]  HPI Rectal Bleeding: Onset: 12/17 after walking on treadmill   Severity: scant amount on undergarments & tissue ; no associated pain Duration: single episode but "specks " after treadmill 12/20  Better with: no treatment  Symptoms Nausea/Vomiting: no  Diarrhea: no  Constipation: no  Melena: no  Hematemesis: no  Anorexia: no  Fever/Chills: no  Dysuria: no, but UTI 3 weeks ago, treated @ UC  Wt loss: no  Dyspepsia/ dysphagia: no EtOH use: no  NSAIDs/ASA: no  Vaginal bleeding/ hemoptysis/epistaxis/ abnormal bruising/ clotting: no  PMH/FH : GI disease Past Surgeries: last colonoscopy  Was in ? 2004      Review of Systems     Objective:   Physical Exam  General appearance is one of good health and nourishment w/o distress.  Eyes: No conjunctival inflammation or scleral icterus is present.  Oral exam: upper plate & lower partial; lips and gums are healthy appearing.There is no oropharyngeal erythema or exudate noted.   Heart:  Normal rate and regular rhythm. S1 and S2 normal without gallop,  click, rub .Grade 1/6 systolic murmur  Lungs:Chest clear to auscultation; no wheezes, rhonchi,rales ,or rubs present.No increased work of breathing.   Abdomen: bowel sounds normal, soft and non-tender without masses, organomegaly or hernias noted.  No guarding or rebound   Skin:Warm & dry.  Intact without suspicious lesions or rashes ; no jaundice or tenting  Lymphatic: No lymphadenopathy is noted about the head, neck, axilla  areas.  Rectal: There was some erythematous tissue at approximately 5 o'clock. No hemorrhoids were palpable and no internal rectal mass was noted. Stool was negative for fecal occult blood.             Assessment & Plan:   #1 rectal bleeding, acute and self-limited. History and exam suggest possible fissure in ano. Her colonoscopy was questionable in 2004.  This would be due in 2014; I will recommend a GI evaluation to consider at least a flexible sigmoidoscopy. Hemoccult cards will be checked along with CBC and differential. There is no clinical evidence of any other significant process

## 2011-06-16 NOTE — Patient Instructions (Signed)
Please complete stool cards. 

## 2011-06-19 ENCOUNTER — Encounter: Payer: Self-pay | Admitting: Internal Medicine

## 2011-07-04 ENCOUNTER — Ambulatory Visit (INDEPENDENT_AMBULATORY_CARE_PROVIDER_SITE_OTHER): Payer: 59 | Admitting: Internal Medicine

## 2011-07-04 ENCOUNTER — Encounter: Payer: Self-pay | Admitting: Internal Medicine

## 2011-07-04 VITALS — BP 132/74 | HR 60 | Ht 65.0 in | Wt 178.0 lb

## 2011-07-04 DIAGNOSIS — K648 Other hemorrhoids: Secondary | ICD-10-CM

## 2011-07-04 MED ORDER — HYDROCORTISONE ACETATE 25 MG RE SUPP: 25.0000 mg | Freq: Every day | RECTAL | Status: AC

## 2011-07-04 NOTE — Progress Notes (Signed)
Subjective:    Patient ID: Angie Garcia, female    DOB: May 20, 1951, 61 y.o.   MRN: [id number]  HPI This pleasant lady is known to me from prior colonoscopy, which was essentially normal, in 2007. More recently, she has had problems with bright red blood per rectum. This started after walking on a treadmill and she noticed something moist in her anal area and she wiped and saw fresh red blood. This has occurred 2 or 3 times or perhaps 4 times total in the last month or so. It is associated with some intermittent anal itching. She saw Dr. Alwyn Ren who had a waiter and saw a little bit of erythema in the perianal area and sent her here for evaluation. She has not had any change in bowel habits, there is no diarrhea or constipation. She does not have significant straining to stool. There is no bleeding during or after defecation, either. She has never had problems like this before, and seeing the blood has scared her. All other GI review of systems is negative. Allergies  Allergen Reactions  . Sulfamethoxazole W/Trimethoprim     rash   Outpatient Prescriptions Prior to Visit  Medication Sig Dispense Refill  . diltiazem (CARDIZEM CD) 120 MG 24 hr capsule TAKE 1 CAPSULE BY MOUTH DAILY  90 capsule  2  . lisinopril (PRINIVIL,ZESTRIL) 20 MG tablet Take 1 tablet (20 mg total) by mouth daily. Report any swelling the lips tongue immediately  30 tablet  5  . Multiple Vitamin (MULTIVITAMIN) tablet Take 1 tablet by mouth daily.         Past Medical History  Diagnosis Date  . Hypertension   . Shingles 10-2008  . Lactose intolerance    Past Surgical History  Procedure Date  . Abdominal hysterectomy   . Pilonidal cystectomy   . Colonoscopy 12/11/2005    lymphoid aggregate removed (suspected polyp was not), melanosis coli   History   Social History  . Marital Status: Married    Spouse Name: N/A    Number of Children: 1  . Years of Education: N/A   Occupational History  . SCHEDULER/FILE ROOM  [employer]   Social History Main Topics  . Smoking status: Former Smoker    Quit date: 06/27/1995  . Smokeless tobacco: Never Used   Comment: QUIT @ 21  . Alcohol Use: No  . Drug Use: No  . Sexually Active:    Other Topics Concern  . None   Social History Narrative   REGULAR EXERCISE   Family History  Problem Relation Age of Onset  . Hypertension Mother   . Breast cancer Mother   . Diabetes Father   . Stroke Father     MINI STROKES  . Stroke Sister   . Diabetes Sister   . Cancer Maternal Uncle     UNKNOWN TYPE  . Colon cancer Neg Hx        Review of Systems Positive for intermittent headaches she calls tension headaches. All other view of systems negative.    Objective:   Physical Exam General:  Well-developed, well-nourished and in no acute distress Eyes:  anicteric. Abdomen:  soft, non-tender, no hepatosplenomegaly, hernia, or mass and BS+.  Rectal: With female staff present, anoderm looks normal. Digital rectal exam is normal. Anoscopy: There are very small slightly inflamed hemorrhoids in the anal canal. Neuro:  A&O x 3.  Psych:  appropriate mood and  Affect.   Data Reviewed: Lab Results  Component Value Date  WBC 6.4 06/16/2011   HGB 12.6 06/16/2011   HCT 38.0 06/16/2011   MCV 86.0 06/16/2011   PLT 356.0 06/16/2011               Assessment & Plan:   1. Hemorrhoids, internal, with bleeding    These are seen an anoscopy today, the clinical history and scenario are entirely compatible with this. I will treat her with hemorrhoidal suppositories, hydrocortisone 25 mg nightly for 7 nights and then she'll 5 more to use as needed. I have instructed her that this is the most likely cause of her problems. She had a colonoscopy in 2007 that did not show any significant problems. I don't think she needs endoscopic evaluation now but it difficult or of the bleeding changes or she has persistent problems she is instructed to return to me. I  have provided hemorrhoid information and explanation to her as well.

## 2011-07-04 NOTE — Patient Instructions (Signed)
Please read the hemorrhoid pamphlet provided. The prescription for hydrocortisone suppositories was sent to your pharmacy. Stop walking on a treadmill for a couple of weeks.  If this treatment fails to stop the bleeding, return to see me 2 consider additional investigation.  Iva Boop, MD, [facility]

## 2011-07-18 ENCOUNTER — Ambulatory Visit: Payer: 59 | Admitting: Internal Medicine

## 2011-08-16 ENCOUNTER — Other Ambulatory Visit: Payer: Self-pay | Admitting: Internal Medicine

## 2011-11-14 ENCOUNTER — Other Ambulatory Visit: Payer: Self-pay | Admitting: Internal Medicine

## 2011-11-14 DIAGNOSIS — Z0000 Encounter for general adult medical examination without abnormal findings: Secondary | ICD-10-CM

## 2011-11-29 ENCOUNTER — Other Ambulatory Visit: Payer: Self-pay | Admitting: Internal Medicine

## 2011-11-29 DIAGNOSIS — Z0000 Encounter for general adult medical examination without abnormal findings: Secondary | ICD-10-CM

## 2012-02-22 ENCOUNTER — Other Ambulatory Visit: Payer: Self-pay | Admitting: Internal Medicine

## 2012-04-01 ENCOUNTER — Other Ambulatory Visit: Payer: Self-pay | Admitting: Internal Medicine

## 2012-04-01 DIAGNOSIS — Z1231 Encounter for screening mammogram for malignant neoplasm of breast: Secondary | ICD-10-CM

## 2012-04-25 ENCOUNTER — Encounter (HOSPITAL_COMMUNITY): Payer: Self-pay | Admitting: Emergency Medicine

## 2012-04-25 ENCOUNTER — Emergency Department (HOSPITAL_COMMUNITY)
Admission: EM | Admit: 2012-04-25 | Discharge: 2012-04-25 | Disposition: A | Discharge status: Home / Self Care | Payer: 59 | Source: Home / Self Care | Attending: Emergency Medicine | Admitting: Emergency Medicine

## 2012-04-25 DIAGNOSIS — J40 Bronchitis, not specified as acute or chronic: Secondary | ICD-10-CM

## 2012-04-25 DIAGNOSIS — N72 Inflammatory disease of cervix uteri: Secondary | ICD-10-CM

## 2012-04-25 MED ORDER — AZITHROMYCIN 250 MG PO TABS: 250.0000 mg | ORAL_TABLET | Freq: Every day | ORAL | Status: DC

## 2012-04-25 MED ORDER — PREDNISONE 20 MG PO TABS: 40.0000 mg | ORAL_TABLET | Freq: Every day | ORAL | Status: AC

## 2012-04-25 NOTE — ED Provider Notes (Signed)
History     CSN: [id number]  Arrival date & time 04/25/12  1016   First MD Initiated Contact with Patient 04/25/12 1032      Chief Complaint  Patient presents with  . URI    (Consider location/radiation/quality/duration/timing/severity/associated sxs/prior treatment) HPI Comments: He started with sinus congestion over the weekend, and congestion of my sinuses has improved but he has moved down to my chest to my throat I keep hearing a rattling sound in my throat, and isthmectomy cough constantly and the last few days had been seen a yellow and brown phlegm when I cough. My chest feels congested and occupied. Been trying to take some over-the-counter medicines like Coricidin but it's not helping. No shortness of breath, wheezing. I think that perhaps Monday or Tuesday I felt a fever (tactile), denies any arthralgias or myalgias but appetite has decreased.  Patient is a 61 y.o. female presenting with URI. The history is provided by the patient.  URI The primary symptoms include fever, sore throat and cough. Primary symptoms do not include fatigue, headaches, swollen glands, wheezing, abdominal pain, nausea, vomiting, myalgias or rash. The current episode started 6 to 7 days ago. The problem has been gradually worsening.  The sore throat is not accompanied by stridor.  Symptoms associated with the illness include chills, sinus pressure, congestion and rhinorrhea.    Past Medical History  Diagnosis Date  . Hypertension   . Shingles 10-2008  . Lactose intolerance     Past Surgical History  Procedure Date  . Abdominal hysterectomy   . Pilonidal cystectomy   . Colonoscopy 12/11/2005    lymphoid aggregate removed (suspected polyp was not), melanosis coli    Family History  Problem Relation Age of Onset  . Hypertension Mother   . Breast cancer Mother   . Diabetes Father   . Stroke Father     MINI STROKES  . Stroke Sister   . Diabetes Sister   . Cancer Maternal Uncle     UNKNOWN  TYPE  . Colon cancer Neg Hx     History  Substance Use Topics  . Smoking status: Former Smoker    Quit date: 06/27/1995  . Smokeless tobacco: Never Used   Comment: QUIT @ 21  . Alcohol Use: No    OB History    Grav Para Term Preterm Abortions TAB SAB Ect Mult Living                  Review of Systems  Constitutional: Positive for fever, chills and activity change. Negative for fatigue and unexpected weight change.  HENT: Positive for congestion, sore throat, rhinorrhea and sinus pressure. Negative for neck stiffness.   Respiratory: Positive for cough. Negative for chest tightness, shortness of breath, wheezing and stridor.   Gastrointestinal: Negative for nausea, vomiting and abdominal pain.  Musculoskeletal: Negative for myalgias.  Skin: Negative for rash.  Neurological: Negative for headaches.    Allergies  Sulfamethoxazole w-trimethoprim  Home Medications   Current Outpatient Rx  Name Route Sig Dispense Refill  . CORICIDIN D COLD/FLU/SINUS PO Oral Take by mouth.    Marland Kitchen OVER THE COUNTER MEDICATION  Over the counter cold medicine    . AZITHROMYCIN 250 MG PO TABS Oral Take 1 tablet (250 mg total) by mouth daily. Take first 2 tablets together, then 1 every day until finished. 6 tablet 0  . DILTIAZEM HCL ER COATED BEADS 120 MG PO CP24  TAKE 1 CAPSULE BY MOUTH DAILY 90 capsule 0  .  HYDROCORTISONE ACETATE 25 MG RE SUPP Rectal Place 1 suppository (25 mg total) rectally daily. Nightly x 7 nights and and then as needed 12 suppository 0  . LISINOPRIL 20 MG PO TABS  TAKE 1 TABLET BY MOUTH DAILY (REPORT ANY SWELLING OF THE LIPS OR TONGUE IMMEDIATELY) 30 tablet 3  . ONE-DAILY MULTI VITAMINS PO TABS Oral Take 1 tablet by mouth daily.      . MULTIVITAMIN PO Oral Take 1 tablet by mouth daily.      Marland Kitchen PREDNISONE 20 MG PO TABS Oral Take 2 tablets (40 mg total) by mouth daily. 2 tablets daily for 5 days 10 tablet 0    BP 139/80  Pulse 74  Temp 99.1 F (37.3 C) (Oral)  Resp 18  SpO2  97%  Physical Exam  Nursing note and vitals reviewed. Constitutional: Vital signs are normal. She appears well-developed and well-nourished.  Non-toxic appearance. She does not have a sickly appearance. She does not appear ill. No distress.  HENT:  Head: Normocephalic.  Right Ear: Tympanic membrane normal.  Left Ear: Tympanic membrane normal.  Nose: Nose normal.  Mouth/Throat: Uvula is midline, oropharynx is clear and moist and mucous membranes are normal. No oropharyngeal exudate.  Eyes: Conjunctivae normal are normal.  Neck: Neck supple. No JVD present.  Cardiovascular: Normal rate.  Exam reveals no gallop and no friction rub.   No murmur heard. Pulmonary/Chest: Effort normal and breath sounds normal. No respiratory distress. She has no decreased breath sounds. She has no wheezes. She has no rhonchi. She has no rales.  Musculoskeletal: Normal range of motion.  Lymphadenopathy:    She has no cervical adenopathy.  Neurological: She is alert.  Skin: No erythema.    ED Course  Procedures (including critical care time)  Labs Reviewed - No data to display No results found.   1. Bronchitis   2. Trachelitis       MDM  Patient with respiratory symptoms and a raspy cough to suggest tracheitis. Have started patient on a prednisone short course of 40 mg daily for 5 days along with azithromycin. Instructed patient to return if no significant improvement is noted after 3 days. Or if worsening symptoms. She agrees with treatment plan and followup care as necessary. Patient is in no respiratory distress afebrile oxygenating well.        Jimmie Molly, MD 04/25/12 [phone]

## 2012-04-25 NOTE — ED Notes (Signed)
Productive cough-yellow and brown, "rattling" in throat, no known fever, general aches.  Onset of symptoms was Monday.

## 2012-05-03 ENCOUNTER — Ambulatory Visit
Admission: RE | Admit: 2012-05-03 | Discharge: 2012-05-03 | Disposition: A | Discharge status: Home / Self Care | Payer: 59 | Source: Ambulatory Visit | Attending: Internal Medicine | Admitting: Internal Medicine

## 2012-05-03 DIAGNOSIS — Z1231 Encounter for screening mammogram for malignant neoplasm of breast: Secondary | ICD-10-CM

## 2012-05-16 ENCOUNTER — Encounter (HOSPITAL_COMMUNITY): Payer: Self-pay | Admitting: *Deleted

## 2012-05-16 ENCOUNTER — Emergency Department (HOSPITAL_COMMUNITY)
Admission: EM | Admit: 2012-05-16 | Discharge: 2012-05-16 | Disposition: A | Discharge status: Home / Self Care | Payer: 59 | Source: Home / Self Care | Attending: Emergency Medicine | Admitting: Emergency Medicine

## 2012-05-16 DIAGNOSIS — J329 Chronic sinusitis, unspecified: Secondary | ICD-10-CM

## 2012-05-16 MED ORDER — FLUTICASONE PROPIONATE 50 MCG/ACT NA SUSP: 2.0000 | Freq: Every day | NASAL | Status: DC

## 2012-05-16 MED ORDER — AMOXICILLIN-POT CLAVULANATE 875-125 MG PO TABS: 1.0000 | ORAL_TABLET | Freq: Two times a day (BID) | ORAL | Status: DC

## 2012-05-16 NOTE — ED Notes (Signed)
Pt  Seen  Several  Weeks  Ago   Was  rx   steriods  And     Anti biotics    -  She   Reports    That  She  Continues to  Have  Phlegm in  Her  Throat    With a  Non  Productive  Cough /  Sinus  Drainage        She ambulated top  Room  With a  Slow  Steady  Gait  She  Is  Sitting  Upright on  Exam table  She  Is  Speaking in  Complete  sentances

## 2012-05-16 NOTE — ED Provider Notes (Signed)
Chief Complaint  Patient presents with  . 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 She was given a Zithromax and 5 days of prednisone 40 mg per day. Her symptoms have by and large improved, however she continues to have postnasal drip and occasionally has to cough or spit up and is clear to yellow in color. She has had some sneezing but denies any nasal congestion, rhinorrhea, headache, sinus pressure, or ear congestion. She has had no sore throat or hoarseness. She has a slight cough which she attributes to the postnasal drip. She denies any wheezing or shortness of breath. She has had no reflux symptoms. She denies any history of allergies or asthma. She has a medication allergy to sulfa. She takes a blood pressure pill.  Review of Systems:  Other than noted above, the patient denies any of the following symptoms. Systemic:  No fever, chills, sweats, fatigue, myalgias, headache, or anorexia. Eye:  No redness, pain or drainage. ENT:  No earache, ear congestion, nasal congestion, sneezing, rhinorrhea, sinus pressure, sinus pain, post nasal drip, or sore throat. Lungs:  No cough, sputum production, wheezing, shortness of breath, or chest pain. GI:  No abdominal pain, nausea, vomiting, or diarrhea.  PMFSH:  Past medical history, family history, social history, meds, and allergies were reviewed.  Physical Exam:   Vital signs:  BP 118/71  Pulse 71  Temp 98.4 F (36.9 C) (Oral)  Resp 18  SpO2 99% General:  Alert, in no distress. Eye:  No conjunctival injection or drainage. Lids were normal. ENT:  TMs and canals were normal, without erythema or inflammation.  Nasal mucosa was clear and uncongested, without drainage.  Mucous membranes were moist.  Pharynx was clear, without exudate or drainage.  There were no oral ulcerations or lesions. Neck:  Supple, no adenopathy, tenderness  or mass. Lungs:  No respiratory distress.  Lungs were clear to auscultation, without wheezes, rales or rhonchi.  Breath sounds were clear and equal bilaterally.  Heart:  Regular rhythm, without gallops, murmers or rubs. Skin:  Clear, warm, and dry, without rash or lesions.  Assessment:  The encounter diagnosis was Sinusitis.  Plan:   1.  The following meds were prescribed:   New Prescriptions   AMOXICILLIN-CLAVULANATE (AUGMENTIN) 875-125 MG PER TABLET    Take 1 tablet by mouth 2 (two) times daily.   FLUTICASONE (FLONASE) 50 MCG/ACT NASAL SPRAY    Place 2 sprays into the nose daily.   2.  The patient was instructed in symptomatic care and handouts were given. 3.  The patient was told to return if becoming worse in any way, if no better in 3 or 4 days, and given some red flag symptoms that would indicate earlier return.   Reuben Likes, MD 05/16/12 [phone]

## 2012-05-27 ENCOUNTER — Other Ambulatory Visit: Payer: Self-pay | Admitting: Internal Medicine

## 2012-05-27 NOTE — Telephone Encounter (Signed)
Patient needs to scheduled a CPX

## 2012-07-15 ENCOUNTER — Other Ambulatory Visit: Payer: Self-pay | Admitting: Internal Medicine

## 2012-07-15 MED ORDER — LISINOPRIL 20 MG PO TABS: ORAL_TABLET | ORAL | Status: DC

## 2012-07-15 NOTE — Telephone Encounter (Signed)
RX sent, letter mailed informing patient CPX due

## 2012-07-15 NOTE — Telephone Encounter (Signed)
LINSINOPRIL 20 MG tablet Last fill 12.2.13 Qty:30 Take 1 tablet by mouth daily (report any swelling on the lips or tongue immediately)

## 2012-08-12 ENCOUNTER — Telehealth: Payer: Self-pay | Admitting: Internal Medicine

## 2012-08-12 NOTE — Telephone Encounter (Signed)
OK X1 but additional refills require office visit to update medical history.Last OV 12/12

## 2012-08-12 NOTE — Telephone Encounter (Signed)
Last OV 06-16-11, last filled lisinopril 07-15-12 #30, last filled diltiazem #90 05-27-12

## 2012-08-12 NOTE — Telephone Encounter (Signed)
Refill: Diltiazem 24 hr er 120 mg. Take 1 capsule by mouth once daily. **Need to schedule an appointment**. Qty 90. Last fill 05-27-12  Refill: Lisinopril 20 mg tablet. Take 1 tablet by mouth once daily (report any swelling of the lips or tongue immediately). Qty 30, Last fill 07-15-12

## 2012-08-13 NOTE — Telephone Encounter (Signed)
Done. Pt has cpe scheduled for 09/06/12.

## 2012-08-13 NOTE — Telephone Encounter (Signed)
Fill meds through  March; will assess need for change @ 3/14 appointment

## 2012-08-14 MED ORDER — LISINOPRIL 20 MG PO TABS: ORAL_TABLET | ORAL | Status: DC

## 2012-08-14 MED ORDER — DILTIAZEM HCL ER COATED BEADS 120 MG PO CP24: ORAL_CAPSULE | ORAL | Status: DC

## 2012-08-14 NOTE — Telephone Encounter (Signed)
Rx sent 

## 2012-08-23 ENCOUNTER — Encounter: Payer: 59 | Admitting: Internal Medicine

## 2012-08-23 ENCOUNTER — Other Ambulatory Visit: Payer: Self-pay | Admitting: Internal Medicine

## 2012-09-06 ENCOUNTER — Encounter: Payer: Self-pay | Admitting: Internal Medicine

## 2012-09-06 ENCOUNTER — Ambulatory Visit (INDEPENDENT_AMBULATORY_CARE_PROVIDER_SITE_OTHER): Payer: 59 | Admitting: Internal Medicine

## 2012-09-06 VITALS — BP 124/78 | HR 65 | Temp 98.2°F | Resp 12 | Ht 65.08 in | Wt 179.0 lb

## 2012-09-06 DIAGNOSIS — B029 Zoster without complications: Secondary | ICD-10-CM

## 2012-09-06 DIAGNOSIS — J309 Allergic rhinitis, unspecified: Secondary | ICD-10-CM

## 2012-09-06 DIAGNOSIS — I10 Essential (primary) hypertension: Secondary | ICD-10-CM

## 2012-09-06 DIAGNOSIS — Z0000 Encounter for general adult medical examination without abnormal findings: Secondary | ICD-10-CM

## 2012-09-06 MED ORDER — DILTIAZEM HCL ER COATED BEADS 120 MG PO CP24: ORAL_CAPSULE | ORAL | Status: DC

## 2012-09-06 MED ORDER — LISINOPRIL 20 MG PO TABS: ORAL_TABLET | ORAL | Status: DC

## 2012-09-06 NOTE — Progress Notes (Signed)
  Subjective:    Patient ID: Angie Garcia, female    DOB: 1950-07-04, 62 y.o.   MRN: [id number]  HPI  Angie Garcia is here for a physical; she denies acute issues .     Review of Systems  CHRONIC HYPERTENSION follow-up:  Home blood pressure  not monitored  Patient is compliant with medications  No adverse effects noted from medication  Exercise program as treadmill 5  times per week for 30 minutes  No specific dietary program ; modified low-salt diet    No chest pain, palpitations, dyspnea, claudication,edema or paroxysmal nocturnal dyspnea described  No significant lightheadedness, headache, epistaxis, or syncope           Objective:   Physical Exam Gen.: Healthy and well-nourished in appearance. Alert, appropriate and cooperative throughout exam. Appears younger than stated age  Head: Normocephalic without obvious abnormalities  Eyes: No corneal or conjunctival inflammation noted. Pupils equal round reactive to light and accommodation.  Extraocular motion intact. Vision grossly normal with lenses Ears: External  ear exam reveals no significant lesions or deformities. Canals clear .TMs normal. Hearing is grossly normal bilaterally. Nose: External nasal exam reveals no deformity or inflammation. Nasal mucosa are pink and moist. No lesions or exudates noted. Mouth: Oral mucosa and oropharynx reveal no lesions or exudates. Upper plate & lower partial. Neck: No deformities, masses, or tenderness noted. Range of motion & Thyroid normal. Lungs: Normal respiratory effort; chest expands symmetrically. Lungs are clear to auscultation without rales, wheezes, or increased work of breathing. Heart: Normal rate and rhythm. Normal S1 and S2. No gallop, click, or rub. Grade 1/2-1 over 6 systolic murmur Abdomen: Bowel sounds normal; abdomen soft and nontender. No masses, organomegaly or hernias noted. Genitalia: As per Gyn                                  Musculoskeletal/extremities: No  deformity or scoliosis noted of  the thoracic or lumbar spine.  No clubbing, cyanosis, edema, or significant extremity  deformity noted. Range of motion normal .Tone & strength  Normal. Joints normal . Nail health good.Crepitus of knees Able to lie down & sit up w/o help. Negative SLR bilaterally Vascular: Carotid, radial artery, dorsalis pedis and  posterior tibial pulses are full and equal. No bruits present. Neurologic: Alert and oriented x3. Deep tendon reflexes symmetrical but 0-1/2+ @ knees        Skin: Intact without suspicious lesions or rashes. Lymph: No cervical, axillary lymphadenopathy present. Psych: Mood and affect are normal. Normally interactive                                                                                       Assessment & Plan:  #1 comprehensive physical exam; no acute findings  Plan: see Orders  & Recommendations

## 2012-09-06 NOTE — Patient Instructions (Addendum)
Preventive Health Care: Eat a low-fat diet with lots of fruits and vegetables, up to 7-9 servings per day. This would eliminate need for vitamin supplements for most individuals. Consume less than 30 grams of sugar per day from foods & drinks with High Fructose Corn Syrup as #2,3 or #4 on label. The legal document "Health Care Power of Attorney & Living Will " verifies decisions concerning your health care. Minimal Blood Pressure Goal= AVERAGE < 140/90;  Ideal is an AVERAGE < 135/85. This AVERAGE should be calculated from @ least 5-7 BP readings taken @ different times of day on different days of week. You should not respond to isolated BP readings , but rather the AVERAGE for that week .Please bring your  blood pressure cuff to office visits to verify that it is reliable.It  can also be checked against the blood pressure device at the pharmacy. Finger or wrist cuffs are not dependable; an arm cuff is. Please  schedule fasting Labs : BMET,Lipids, hepatic panel, CBC & dif, TSH.PLEASE BRING THESE INSTRUCTIONS TO FOLLOW UP  LAB APPOINTMENT.This will guarantee correct labs are drawn, eliminating need for repeat blood sampling ( needle sticks ! ). Diagnoses /Codes: V70.0.  If you activate the  My Chart system; lab & Xray results will be released directly  to you as soon as I review & address these through the computer. If you choose not to sign up for My Chart within 36 hours of labs being drawn; results will be reviewed & interpretation added before being copied & mailed, causing a delay in getting the results to you.If you do not receive that report within 7-10 days ,please call. Additionally you can use this system to gain direct  access to your records  if  out of town or @ an office of a  physician who is not in  the My Chart network.  This improves continuity of care & places you in control of your medical record.

## 2012-09-09 ENCOUNTER — Other Ambulatory Visit (INDEPENDENT_AMBULATORY_CARE_PROVIDER_SITE_OTHER): Payer: 59

## 2012-09-09 DIAGNOSIS — Z0000 Encounter for general adult medical examination without abnormal findings: Secondary | ICD-10-CM

## 2012-09-09 LAB — CBC WITH DIFFERENTIAL/PLATELET
Basophils Absolute: 0.1 10*3/uL (ref 0.0–0.1)
HCT: 41.6 % (ref 36.0–46.0)
Lymphs Abs: 1.5 10*3/uL (ref 0.7–4.0)
Monocytes Absolute: 0.4 10*3/uL (ref 0.1–1.0)
Monocytes Relative: 7.5 % (ref 3.0–12.0)
Platelets: 341 10*3/uL (ref 150.0–400.0)
RDW: 12.4 % (ref 11.5–14.6)

## 2012-09-09 LAB — BASIC METABOLIC PANEL
CO2: 25 mEq/L (ref 19–32)
Glucose, Bld: 97 mg/dL (ref 70–99)
Potassium: 3.7 mEq/L (ref 3.5–5.1)
Sodium: 138 mEq/L (ref 135–145)

## 2012-09-09 LAB — HEPATIC FUNCTION PANEL
ALT: 15 U/L (ref 0–35)
AST: 19 U/L (ref 0–37)
Albumin: 4 g/dL (ref 3.5–5.2)
Alkaline Phosphatase: 71 U/L (ref 39–117)

## 2012-09-09 LAB — LIPID PANEL
HDL: 75.9 mg/dL (ref >39.00)
Triglycerides: 70 mg/dL (ref 0.0–149.0)
VLDL: 14 mg/dL (ref 0.0–40.0)

## 2012-09-09 LAB — TSH: TSH: 0.47 u[IU]/mL (ref 0.35–5.50)

## 2012-11-19 ENCOUNTER — Other Ambulatory Visit: Payer: Self-pay | Admitting: Internal Medicine

## 2012-11-20 ENCOUNTER — Telehealth: Payer: Self-pay | Admitting: Internal Medicine

## 2012-11-20 NOTE — Telephone Encounter (Signed)
Pt is calling for a refill of Diltiazem and Lisinopril.  Pt states she called the pharmacy yesterday regarding refill but they have not heard back.  Pt uses [facility].  OFFICE PLEASE FOLLOW UP WITH PT

## 2012-11-20 NOTE — Telephone Encounter (Signed)
Duplicate request, rx was received yesterday(completed today). Discussed with patient, 48 hour turn around time with refill request, patient verbalized understanding.

## 2013-03-31 ENCOUNTER — Other Ambulatory Visit: Payer: Self-pay

## 2013-03-31 DIAGNOSIS — Z1231 Encounter for screening mammogram for malignant neoplasm of breast: Secondary | ICD-10-CM

## 2013-04-01 ENCOUNTER — Encounter: Payer: Self-pay | Admitting: Internal Medicine

## 2013-04-01 ENCOUNTER — Ambulatory Visit (INDEPENDENT_AMBULATORY_CARE_PROVIDER_SITE_OTHER): Payer: 59 | Admitting: Internal Medicine

## 2013-04-01 VITALS — BP 135/78 | HR 70 | Temp 98.1°F | Resp 13 | Wt 184.2 lb

## 2013-04-01 DIAGNOSIS — M1710 Unilateral primary osteoarthritis, unspecified knee: Secondary | ICD-10-CM | POA: Insufficient documentation

## 2013-04-01 DIAGNOSIS — M25461 Effusion, right knee: Secondary | ICD-10-CM

## 2013-04-01 DIAGNOSIS — IMO0002 Reserved for concepts with insufficient information to code with codable children: Secondary | ICD-10-CM

## 2013-04-01 DIAGNOSIS — M25469 Effusion, unspecified knee: Secondary | ICD-10-CM

## 2013-04-01 DIAGNOSIS — M1711 Unilateral primary osteoarthritis, right knee: Secondary | ICD-10-CM

## 2013-04-01 NOTE — Patient Instructions (Addendum)
Supplements which may be of benefit in treating degenerative joint disease included ginger, tart cherry, and cumerin. This is the recommendation of a Rheumatologist for whom I have great respect. Use an anti-inflammatory cream such as Aspercreme or Zostrix cream twice a day to the affected area as needed. In lieu of this warm moist compresses or  hot water bottle can be used. Do not apply ice .

## 2013-04-01 NOTE — Progress Notes (Signed)
  Subjective:    Patient ID: Angie Garcia, female    DOB: [birth year], 62 y.o.   MRN: [id number]  HPI  Symptoms began in May of this year as some instability in the right knee. She describes it as the knee "giving away" when she applies pressure such as walking.  There was no specific trigger or injury.  She's been applying a knee support with benefit. There's been occasional aching but pain is not a significant component of her symptoms. There is a "popping" at the right knee.  She does not take any medication for this; she simply decreases the level of exercise.  Mother had gout    Review of Systems  There's been no redness or swelling over the knee. No PMH of podagra.  She denies any constitutional symptoms of fever, chills, sweats, or unexplained weight loss  There is no associated numbness or tingling in the leg.  She's had no incontinence of urine or stool.     Objective:   Physical Exam Gen.: Healthy and well-nourished in appearance. Alert, appropriate and cooperative throughout exam.Appears younger than stated age                                   Musculoskeletal/extremities: No deformity or scoliosis noted of  the thoracic or lumbar spine.   No clubbing, cyanosis, edema, or significant extremity  deformity noted. Range of motion normal .Tone & strength  Normal. Joints normal . Nail health good. Able to lie down & sit up w/o help. Negative SLR bilaterally.  There is crepitus in both knees. With flexion there is an audible and palpable "pop" above the right patella. Some effusion is noted on the right. There is no definite instability with lateral rotation of the knee. Vascular:  dorsalis pedis and  posterior tibial pulses are full and equal. No bruits present. Neurologic: Alert and oriented x3. Deep tendon reflexes symmetrical and normal.  Gait normal  including heel & toe walking .        Skin: Intact without suspicious lesions or rashes. Psych: Mood and affect are  normal. Normally interactive                                                                                        Assessment & Plan:  #1 degenerative joint disease of the knee with soft tissue instability  Plan: Trial of glucosamine sulfate  Not possible due to sulfa allergy. Topical agents pre Sports Medicine consult.

## 2013-04-04 ENCOUNTER — Encounter: Payer: Self-pay | Admitting: Internal Medicine

## 2013-04-04 ENCOUNTER — Ambulatory Visit (INDEPENDENT_AMBULATORY_CARE_PROVIDER_SITE_OTHER): Payer: 59 | Admitting: Internal Medicine

## 2013-04-04 ENCOUNTER — Ambulatory Visit: Payer: 59 | Admitting: Family Medicine

## 2013-04-04 VITALS — BP 118/76 | HR 67 | Temp 98.2°F | Wt 186.0 lb

## 2013-04-04 DIAGNOSIS — K3189 Other diseases of stomach and duodenum: Secondary | ICD-10-CM

## 2013-04-04 DIAGNOSIS — R1013 Epigastric pain: Secondary | ICD-10-CM

## 2013-04-04 DIAGNOSIS — D72829 Elevated white blood cell count, unspecified: Secondary | ICD-10-CM

## 2013-04-04 NOTE — Patient Instructions (Signed)
Take OTC Prilosec 20 mg one tablet every morning x 2 weeks  Drink plenty of fluids Call if not improving in few days, call anytime if the symptoms get much worse.

## 2013-04-04 NOTE — Progress Notes (Signed)
  Subjective:    Patient ID: Angie Garcia, female    DOB: [birth year], 62 y.o.   MRN: [id number]  HPI Acute visit Symptoms started 4 days ago, feeling upper mid abdominal discomfort, "gassy" denies feeling bloated. She wonders if it is related with the flu shot she had the day prior to the symptoms onset. Also has noted a strong smell in the urine  Past Medical History  Diagnosis Date  . Hypertension   . Shingles 10/2008    RUE  . Lactose intolerance    Past Surgical History  Procedure Laterality Date  . Abdominal hysterectomy      Dr Roberto Scales for fibroids  . Pilonidal cystectomy    . Colonoscopy  12/11/2005    lymphoid aggregate removed (suspected polyp was not), melanosis coli   History   Social History  . Marital Status: Married    Spouse Name: N/A    Number of Children: 1  . Years of Education: N/A   Occupational History  . 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 Smoking status: Former Smoker    Quit date: 06/27/1995  . Smokeless tobacco: Never Used     Comment: QUIT @ 21  . Alcohol Use: No  . Drug Use: No  . Sexual Activity:    Other Topics Concern  . Not on file   Social History Narrative   REGULAR EXERCISE     Review of Systems Appetite remains normal. No  nausea, vomiting, diarrhea or blood in the stools. BMs normal. Mild dysuria, no gross hematuria. No recent NSAIDs Except for one Aleve 3 days ago.     Objective:   Physical Exam BP 118/76  Pulse 67  Temp(Src) 98.2 F (36.8 C)  Wt 186 lb (84.369 kg)  BMI 30.88 kg/m2  SpO2 100% General -- alert, well-developed, NAD.  Lungs -- normal respiratory effort, no intercostal retractions, no accessory muscle use, and normal breath sounds.  Heart-- normal rate, regular rhythm, no murmur.  Abdomen-- Not distended, good bowel sounds,soft, non-tender.  No CVA tenderness Extremities-- no pretibial edema bilaterally  Neurologic--  alert & oriented X3. Speech normal,  gait normal, strength normal in all extremities.  DTRs symmetric  Psych-- Cognition and judgment appear intact. Cooperative with normal attention span and concentration. No anxious appearing , no depressed appearing.     Assessment & Plan:  Upper abdominal discomfort, dyspepsia. Exam is benign, etiology unclear. Udip + leukocytes Plan: PPIs for few days Continue avoiding NSAIDs Urine culture and treat if appropriate Call if not improving, u/s?

## 2013-04-06 LAB — URINE CULTURE

## 2013-04-07 ENCOUNTER — Encounter: Payer: Self-pay | Admitting: *Deleted

## 2013-04-07 NOTE — Progress Notes (Signed)
Patient notified

## 2013-04-11 ENCOUNTER — Ambulatory Visit (INDEPENDENT_AMBULATORY_CARE_PROVIDER_SITE_OTHER): Payer: 59 | Admitting: *Deleted

## 2013-04-11 DIAGNOSIS — Z23 Encounter for immunization: Secondary | ICD-10-CM

## 2013-04-11 DIAGNOSIS — Z2911 Encounter for prophylactic immunotherapy for respiratory syncytial virus (RSV): Secondary | ICD-10-CM

## 2013-04-29 ENCOUNTER — Ambulatory Visit: Payer: 59 | Admitting: Family Medicine

## 2013-05-05 ENCOUNTER — Ambulatory Visit
Admission: RE | Admit: 2013-05-05 | Discharge: 2013-05-05 | Disposition: A | Discharge status: Home / Self Care | Payer: 59 | Source: Ambulatory Visit

## 2013-05-05 DIAGNOSIS — Z1231 Encounter for screening mammogram for malignant neoplasm of breast: Secondary | ICD-10-CM

## 2013-05-06 ENCOUNTER — Other Ambulatory Visit: Payer: Self-pay | Admitting: Internal Medicine

## 2013-05-06 DIAGNOSIS — R928 Other abnormal and inconclusive findings on diagnostic imaging of breast: Secondary | ICD-10-CM

## 2013-05-07 ENCOUNTER — Ambulatory Visit
Admission: RE | Admit: 2013-05-07 | Discharge: 2013-05-07 | Disposition: A | Discharge status: Home / Self Care | Payer: 59 | Source: Ambulatory Visit | Attending: Internal Medicine | Admitting: Internal Medicine

## 2013-05-07 DIAGNOSIS — R928 Other abnormal and inconclusive findings on diagnostic imaging of breast: Secondary | ICD-10-CM

## 2013-07-22 ENCOUNTER — Telehealth: Payer: Self-pay | Admitting: *Deleted

## 2013-07-22 NOTE — Telephone Encounter (Signed)
Fibrocystic breast changes can be aggravated by caffeine intake  ( coffee, tea, cola, chocolate ). Avoid thse to excess  & consider vitamin E 400 international units daily for 4-6 weeks prior to mammograms

## 2013-07-22 NOTE — Telephone Encounter (Signed)
Please advise 

## 2013-07-22 NOTE — Telephone Encounter (Signed)
Patient called and states that every time she has a mammogram test done it comes back that she has dense breast. Then she has to go get an ultra sound done. Patient wanted to see if it could just be noted in her chart that she will need an ultra sound done every time.

## 2013-07-23 NOTE — Telephone Encounter (Signed)
Returned call to patient to inform her of Dr. Clayborn Heron recommendations, unable to leave message. JG//CMA

## 2013-08-06 ENCOUNTER — Telehealth: Payer: Self-pay | Admitting: Internal Medicine

## 2013-08-06 NOTE — Telephone Encounter (Signed)
She will have to verify whether this is an option for her present insurance plan and through the imaging center. It does not matter to me but any limitations or restrictions must be addressed through her insurance company.

## 2013-08-06 NOTE — Telephone Encounter (Signed)
Pt called wanting to know if you could put order in for breast US . Her mammograms keep coming back abnormal because of dense breast and she keep getting call back to do U/S.

## 2013-08-21 ENCOUNTER — Other Ambulatory Visit: Payer: Self-pay | Admitting: Internal Medicine

## 2013-08-26 NOTE — Telephone Encounter (Signed)
Pt called to follow on these med request. Please advise, pt is out of this med.

## 2013-08-26 NOTE — Telephone Encounter (Signed)
I am sorry; what med is requested? Thanks, [employer]

## 2013-08-28 ENCOUNTER — Telehealth: Payer: Self-pay | Admitting: *Deleted

## 2013-09-17 NOTE — Telephone Encounter (Signed)
Spoke with the pt on (08-28-13), and she was requesting a breast US.  She stated that when goes for her mammogram in 6 mos they will have her come back for the US,so she was trying to by pass the mammogram and have the [place].  She stated that if the doctor will put it as a preventative care the ins will cover it.  Pt said her last mammogram was in Nov,which showed density dense breast.   So she's to go back for the 6 mos check.  Pt stated she will just call the breast center first.//AB/CMA

## 2013-09-30 ENCOUNTER — Other Ambulatory Visit: Payer: Self-pay | Admitting: Internal Medicine

## 2013-09-30 DIAGNOSIS — N63 Unspecified lump in unspecified breast: Secondary | ICD-10-CM

## 2013-10-24 ENCOUNTER — Ambulatory Visit
Admission: RE | Admit: 2013-10-24 | Discharge: 2013-10-24 | Disposition: A | Discharge status: Home / Self Care | Payer: 59 | Source: Ambulatory Visit | Attending: Internal Medicine | Admitting: Internal Medicine

## 2013-10-24 DIAGNOSIS — N63 Unspecified lump in unspecified breast: Secondary | ICD-10-CM

## 2014-02-11 ENCOUNTER — Emergency Department (HOSPITAL_COMMUNITY)
Admission: EM | Admit: 2014-02-11 | Discharge: 2014-02-11 | Disposition: A | Discharge status: Home / Self Care | Payer: 59 | Source: Home / Self Care | Attending: Emergency Medicine | Admitting: Emergency Medicine

## 2014-02-11 ENCOUNTER — Encounter (HOSPITAL_COMMUNITY): Payer: Self-pay | Admitting: Emergency Medicine

## 2014-02-11 DIAGNOSIS — L237 Allergic contact dermatitis due to plants, except food: Secondary | ICD-10-CM

## 2014-02-11 DIAGNOSIS — L255 Unspecified contact dermatitis due to plants, except food: Secondary | ICD-10-CM

## 2014-02-11 MED ORDER — TRIAMCINOLONE ACETONIDE 0.1 % EX CREA: 1.0000 "application " | TOPICAL_CREAM | Freq: Two times a day (BID) | CUTANEOUS | Status: DC | PRN

## 2014-02-11 NOTE — Discharge Instructions (Signed)
It looks like poison ivy.  It does not look like shingles.  Use benadryl cream as needed. Use the triamcinolone cream twice a day as needed.  It should improve over the next week. Follow up with your regular doctor as needed.

## 2014-02-11 NOTE — ED Provider Notes (Signed)
CSN: [id number]     Arrival date & time 02/11/14  1701 History   First MD Initiated Contact with Patient 02/11/14 1751     Chief Complaint  Patient presents with  . Rash   (Consider location/radiation/quality/duration/timing/severity/associated sxs/prior Treatment) HPI She is a 63 year old woman here today for evaluation of rash. She states the rash started about one week ago. He was initially just a line on her left hand, but then developed on her right upper inner arm as well. It is described as itchy. There is no pain. It is not spreading. She's been applying cortisone cream with some improvement. No fevers or chills. She states she and her husband were up in the [place] about 7-10 days ago. She does have a history of shingles along the lateral aspect of her right arm, and she is concerned that this might be shingles.  Past Medical History  Diagnosis Date  . Hypertension   . Shingles 10/2008    RUE  . Lactose intolerance    Past Surgical History  Procedure Laterality Date  . Abdominal hysterectomy      Dr Connye Burkitt for fibroids  . Pilonidal cystectomy    . Colonoscopy  12/11/2005    lymphoid aggregate removed (suspected polyp was not), melanosis coli   Family History  Problem Relation Age of Onset  . Hypertension Mother   . Breast cancer Mother   . Diabetes Father   . Stroke Father     MINI STROKES  . Stroke Sister 74  . Diabetes Sister   . Cancer Maternal Uncle     UNKNOWN TYPE  . Colon cancer Neg Hx    History  Substance Use Topics  . Smoking status: Former Smoker    Quit date: 06/27/1995  . Smokeless tobacco: Never Used     Comment: QUIT @ 21  . Alcohol Use: No   OB History   Grav Para Term Preterm Abortions TAB SAB Ect Mult Living                 Review of Systems  Constitutional: Negative.   Skin: Positive for rash.  Neurological: Negative for numbness.    Allergies  Sulfamethoxazole-trimethoprim  Home Medications   Prior to Admission  medications   Medication Sig Start Date End Date Taking? Authorizing Provider  AMBULATORY NON FORMULARY MEDICATION Medication Name: Vit D 500 iu 1 by mouth daily    Historical Provider, MD  diltiazem (CARDIZEM CD) 120 MG 24 hr capsule TAKE 1 CAPSULE BY MOUTH DAILY    Hendricks Limes, MD  fluticasone [facility]) 50 MCG/ACT nasal spray Place 2 sprays into the nose daily. 05/16/12   Harden Mo, MD  lisinopril (PRINIVIL,ZESTRIL) 20 MG tablet TAKE 1 TABLET BY MOUTH DAILY (REPORT ANY SWELLING OF THE LIPS OR TONGUE IMMEDIATELY).    Hendricks Limes, MD  omeprazole (PRILOSEC OTC) 20 MG tablet Take 20 mg by mouth daily.    Historical Provider, MD  triamcinolone cream (KENALOG) 0.1 % Apply 1 application topically 2 (two) times daily as needed. 02/11/14   Melony Overly, MD   BP 128/70  Pulse 60  Temp(Src) 97 F (36.1 C) (Oral)  SpO2 94% Physical Exam  Constitutional: She is oriented to person, place, and time. She appears well-developed and well-nourished. No distress.  Neurological: She is alert and oriented to person, place, and time.  Skin: Skin is warm and dry. Rash noted.  Linear erythematous vesiculo-papular rash across dorsal left hand; erythematous macular-papular rash  on right inner upper arm    ED Course  Procedures (including critical care time) Labs Review Labs Reviewed - No data to display  Imaging Review No results found.   MDM   1. Poison ivy    History and exam consistent with poison ivy. Distribution is not consistent with shingles. Will treat with topical triamcinolone cream. Also recommended topical Benadryl as needed for itching. Followup with PCP if no improvement after one week.    Melony Overly, MD 02/11/14 [phone]

## 2014-02-11 NOTE — ED Notes (Signed)
Patient complains of rash to the top of her hands And arms that started about  Four days ago Complains of being very itchy Has done some gardening recently and it appeared shortly afterwards

## 2014-03-23 ENCOUNTER — Other Ambulatory Visit: Payer: Self-pay

## 2014-03-23 DIAGNOSIS — Z1231 Encounter for screening mammogram for malignant neoplasm of breast: Secondary | ICD-10-CM

## 2014-05-15 ENCOUNTER — Ambulatory Visit
Admission: RE | Admit: 2014-05-15 | Discharge: 2014-05-15 | Disposition: A | Discharge status: Home / Self Care | Payer: 59 | Source: Ambulatory Visit

## 2014-05-15 DIAGNOSIS — Z1231 Encounter for screening mammogram for malignant neoplasm of breast: Secondary | ICD-10-CM

## 2014-05-20 ENCOUNTER — Other Ambulatory Visit: Payer: Self-pay

## 2014-05-20 MED ORDER — DILTIAZEM HCL ER COATED BEADS 120 MG PO CP24: 120.0000 mg | ORAL_CAPSULE | Freq: Every day | ORAL | Status: DC

## 2014-05-20 MED ORDER — LISINOPRIL 20 MG PO TABS: ORAL_TABLET | ORAL | Status: DC

## 2014-06-02 ENCOUNTER — Ambulatory Visit (INDEPENDENT_AMBULATORY_CARE_PROVIDER_SITE_OTHER): Payer: 59 | Admitting: Internal Medicine

## 2014-06-02 ENCOUNTER — Encounter: Payer: Self-pay | Admitting: Internal Medicine

## 2014-06-02 ENCOUNTER — Other Ambulatory Visit (INDEPENDENT_AMBULATORY_CARE_PROVIDER_SITE_OTHER): Payer: 59

## 2014-06-02 VITALS — BP 134/78 | HR 63 | Temp 98.3°F | Resp 12 | Ht 66.0 in | Wt 184.5 lb

## 2014-06-02 DIAGNOSIS — Z0189 Encounter for other specified special examinations: Secondary | ICD-10-CM

## 2014-06-02 DIAGNOSIS — E7800 Pure hypercholesterolemia, unspecified: Secondary | ICD-10-CM | POA: Insufficient documentation

## 2014-06-02 DIAGNOSIS — Z0000 Encounter for general adult medical examination without abnormal findings: Secondary | ICD-10-CM

## 2014-06-02 DIAGNOSIS — K219 Gastro-esophageal reflux disease without esophagitis: Secondary | ICD-10-CM

## 2014-06-02 DIAGNOSIS — E785 Hyperlipidemia, unspecified: Secondary | ICD-10-CM

## 2014-06-02 DIAGNOSIS — R946 Abnormal results of thyroid function studies: Secondary | ICD-10-CM

## 2014-06-02 LAB — LIPID PANEL
Cholesterol: 230 mg/dL — ABNORMAL HIGH (ref 0–200)
HDL: 81.3 mg/dL (ref >39.00)
LDL Cholesterol: 139 mg/dL — ABNORMAL HIGH (ref 0–99)
NonHDL: 148.7
Total CHOL/HDL Ratio: 3
Triglycerides: 50 mg/dL (ref 0.0–149.0)
VLDL: 10 mg/dL (ref 0.0–40.0)

## 2014-06-02 LAB — BASIC METABOLIC PANEL
BUN: 16 mg/dL (ref 6–23)
CHLORIDE: 106 meq/L (ref 96–112)
CO2: 26 meq/L (ref 19–32)
Calcium: 10.9 mg/dL — ABNORMAL HIGH (ref 8.4–10.5)
Creatinine, Ser: 0.7 mg/dL (ref 0.4–1.2)
GFR: 102.45 mL/min (ref >60.00)
Glucose, Bld: 87 mg/dL (ref 70–99)
POTASSIUM: 4.2 meq/L (ref 3.5–5.1)
Sodium: 139 mEq/L (ref 135–145)

## 2014-06-02 LAB — CBC WITH DIFFERENTIAL/PLATELET
Basophils Absolute: 0 10*3/uL (ref 0.0–0.1)
Basophils Relative: 0.6 % (ref 0.0–3.0)
EOS PCT: 2.3 % (ref 0.0–5.0)
Eosinophils Absolute: 0.2 10*3/uL (ref 0.0–0.7)
HEMATOCRIT: 40.9 % (ref 36.0–46.0)
Hemoglobin: 13 g/dL (ref 12.0–15.0)
LYMPHS ABS: 2.5 10*3/uL (ref 0.7–4.0)
Lymphocytes Relative: 35 % (ref 12.0–46.0)
MCHC: 31.7 g/dL (ref 30.0–36.0)
MCV: 85.9 fl (ref 78.0–100.0)
Monocytes Absolute: 0.5 10*3/uL (ref 0.1–1.0)
Monocytes Relative: 6.7 % (ref 3.0–12.0)
Neutro Abs: 4 10*3/uL (ref 1.4–7.7)
Neutrophils Relative %: 55.4 % (ref 43.0–77.0)
PLATELETS: 352 10*3/uL (ref 150.0–400.0)
RBC: 4.76 Mil/uL (ref 3.87–5.11)
RDW: 13.4 % (ref 11.5–15.5)
WBC: 7.2 10*3/uL (ref 4.0–10.5)

## 2014-06-02 LAB — HEPATIC FUNCTION PANEL
ALT: 17 U/L (ref 0–35)
AST: 22 U/L (ref 0–37)
Albumin: 3.9 g/dL (ref 3.5–5.2)
Alkaline Phosphatase: 71 U/L (ref 39–117)
Bilirubin, Direct: 0 mg/dL (ref 0.0–0.3)
TOTAL PROTEIN: 6.9 g/dL (ref 6.0–8.3)
Total Bilirubin: 0.4 mg/dL (ref 0.2–1.2)

## 2014-06-02 LAB — TSH: TSH: 0.33 u[IU]/mL — ABNORMAL LOW (ref 0.35–4.50)

## 2014-06-02 NOTE — Progress Notes (Signed)
   Subjective:    Patient ID: Angie Garcia, female    DOB: 07/25/1950, 63 y.o.   MRN: [id number]  HPI  She is here for a physical;acute issues include recent dyspepsia flare.   Her dyspepsia flared over the holidays related diet change. She used over-the-counter medications with resolution. She has no other active GI symptoms.   She has been compliant with her diltiazem and lisinopril without adverse effects. She is not monitoring blood pressure at home.  She is on a heart healthy, low-salt diet.  She uses a treadmill 30 minutes 5 days a week without cardio pulmonary symptoms. She must wear a brace on the knee due to instability    Review of Systems Unexplained weight loss, abdominal pain, dysphagia, melena, rectal bleeding, or persistently small caliber stools are denied.  Chest pain, palpitations, tachycardia, exertional dyspnea, paroxysmal nocturnal dyspnea, claudication or edema are absent.       Objective:   Physical Exam Gen.: Healthy and well-nourished in appearance. Alert, appropriate and cooperative throughout exam. Appears younger than stated age  Head: Normocephalic without obvious abnormalities  Eyes: No corneal or conjunctival inflammation noted. Pupils equal round reactive to light and accommodation. Extraocular motion intact.  Ears: External  ear exam reveals no significant lesions or deformities.Wax bilaterally. Hearing is grossly normal bilaterally. Nose: External nasal exam reveals no deformity or inflammation. Nasal mucosa are pink and moist. No lesions or exudates noted.   Mouth: Oral mucosa and oropharynx reveal no lesions or exudates. Upper plate & lower partial. Neck: No deformities, masses, or tenderness noted. Range of motion & Thyroid normal Lungs: Normal respiratory effort; chest expands symmetrically. Lungs are clear to auscultation without rales, wheezes, or increased work of breathing. Heart: Normal rate and rhythm. Normal S1 and S2. No gallop, click, or  rub. Grade 1/2 over 6 systolic murmur. Abdomen: Bowel sounds normal; abdomen soft and nontender. No masses, organomegaly or hernias noted. Genitalia: as per Gyn                                  Musculoskeletal/extremities: No deformity or scoliosis noted of  the thoracic or lumbar spine.  No clubbing, cyanosis, edema, or significant extremity  deformity noted.  Range of motion normal . Tone & strength normal. Hand joints normal Fingernail health good. Crepitus of knees  Able to lie down & sit up w/o help.  Negative SLR bilaterally Vascular: Carotid, radial artery, dorsalis pedis and  posterior tibial pulses are full and equal. No bruits present. Neurologic: Alert and oriented x3. Deep tendon reflexes symmetrical and normal.  Gait normal      Skin: Intact without suspicious lesions or rashes. Lymph: No cervical, axillary lymphadenopathy present. Psych: Mood and affect are normal. Normally interactive                                                                                        Assessment & Plan:  #1 comprehensive physical exam; no acute findings  Plan: see Orders  & Recommendations

## 2014-06-02 NOTE — Progress Notes (Signed)
Pre visit review using our clinic review tool, if applicable. No additional management support is needed unless otherwise documented below in the visit note. 

## 2014-06-02 NOTE — Patient Instructions (Signed)
Your next office appointment will be determined based upon review of your pending labs . Those instructions will be transmitted to you through My Chart   Reflux of gastric acid may be asymptomatic as this may occur mainly during sleep.The triggers for reflux  include stress; the "aspirin family" ; alcohol; peppermint; and caffeine (coffee, tea, cola, and chocolate). The aspirin family would include aspirin and the nonsteroidal agents such as ibuprofen &  Naproxen. Tylenol would not cause reflux. If having symptoms ; food & drink should be avoided for @ least 2 hours before going to bed.

## 2014-06-03 ENCOUNTER — Other Ambulatory Visit: Payer: Self-pay | Admitting: Internal Medicine

## 2014-06-03 DIAGNOSIS — R946 Abnormal results of thyroid function studies: Secondary | ICD-10-CM | POA: Insufficient documentation

## 2015-02-19 ENCOUNTER — Other Ambulatory Visit: Payer: Self-pay | Admitting: Internal Medicine

## 2015-04-14 ENCOUNTER — Other Ambulatory Visit: Payer: Self-pay

## 2015-04-14 DIAGNOSIS — Z1231 Encounter for screening mammogram for malignant neoplasm of breast: Secondary | ICD-10-CM

## 2015-05-18 ENCOUNTER — Ambulatory Visit
Admission: RE | Admit: 2015-05-18 | Discharge: 2015-05-18 | Disposition: A | Discharge status: Home / Self Care | Payer: 59 | Source: Ambulatory Visit

## 2015-05-18 DIAGNOSIS — Z1231 Encounter for screening mammogram for malignant neoplasm of breast: Secondary | ICD-10-CM

## 2015-07-27 ENCOUNTER — Encounter: Payer: 59 | Admitting: Internal Medicine

## 2015-08-16 ENCOUNTER — Ambulatory Visit (INDEPENDENT_AMBULATORY_CARE_PROVIDER_SITE_OTHER): Payer: 59 | Admitting: Internal Medicine

## 2015-08-16 ENCOUNTER — Other Ambulatory Visit (INDEPENDENT_AMBULATORY_CARE_PROVIDER_SITE_OTHER): Payer: 59

## 2015-08-16 ENCOUNTER — Encounter: Payer: Self-pay | Admitting: Internal Medicine

## 2015-08-16 ENCOUNTER — Encounter: Payer: Self-pay | Admitting: Emergency Medicine

## 2015-08-16 VITALS — BP 112/74 | HR 58 | Temp 98.3°F | Resp 16 | Wt 179.0 lb

## 2015-08-16 DIAGNOSIS — Z0000 Encounter for general adult medical examination without abnormal findings: Secondary | ICD-10-CM

## 2015-08-16 DIAGNOSIS — I10 Essential (primary) hypertension: Secondary | ICD-10-CM

## 2015-08-16 DIAGNOSIS — K219 Gastro-esophageal reflux disease without esophagitis: Secondary | ICD-10-CM | POA: Diagnosis not present

## 2015-08-16 DIAGNOSIS — R7303 Prediabetes: Secondary | ICD-10-CM

## 2015-08-16 LAB — COMPREHENSIVE METABOLIC PANEL
ALK PHOS: 77 U/L (ref 39–117)
ALT: 13 U/L (ref 0–35)
AST: 17 U/L (ref 0–37)
Albumin: 4.1 g/dL (ref 3.5–5.2)
BUN: 12 mg/dL (ref 6–23)
CO2: 26 mEq/L (ref 19–32)
CREATININE: 0.78 mg/dL (ref 0.40–1.20)
Calcium: 10.8 mg/dL — ABNORMAL HIGH (ref 8.4–10.5)
Chloride: 108 mEq/L (ref 96–112)
GFR: 96.03 mL/min (ref >60.00)
GLUCOSE: 96 mg/dL (ref 70–99)
Potassium: 4.2 mEq/L (ref 3.5–5.1)
Sodium: 140 mEq/L (ref 135–145)
Total Bilirubin: 0.3 mg/dL (ref 0.2–1.2)
Total Protein: 7 g/dL (ref 6.0–8.3)

## 2015-08-16 LAB — LIPID PANEL
Cholesterol: 237 mg/dL — ABNORMAL HIGH (ref 0–200)
HDL: 70.3 mg/dL (ref >39.00)
LDL Cholesterol: 148 mg/dL — ABNORMAL HIGH (ref 0–99)
NONHDL: 167.08
Total CHOL/HDL Ratio: 3
Triglycerides: 95 mg/dL (ref 0.0–149.0)
VLDL: 19 mg/dL (ref 0.0–40.0)

## 2015-08-16 LAB — HEMOGLOBIN A1C: HEMOGLOBIN A1C: 5.9 % (ref 4.6–6.5)

## 2015-08-16 LAB — T4, FREE: Free T4: 0.84 ng/dL (ref 0.60–1.60)

## 2015-08-16 LAB — CBC WITH DIFFERENTIAL/PLATELET
BASOS ABS: 0.1 10*3/uL (ref 0.0–0.1)
Basophils Relative: 0.8 % (ref 0.0–3.0)
Eosinophils Absolute: 0.1 10*3/uL (ref 0.0–0.7)
Eosinophils Relative: 1.5 % (ref 0.0–5.0)
HCT: 40.9 % (ref 36.0–46.0)
Hemoglobin: 13.3 g/dL (ref 12.0–15.0)
LYMPHS ABS: 2.2 10*3/uL (ref 0.7–4.0)
Lymphocytes Relative: 33 % (ref 12.0–46.0)
MCHC: 32.6 g/dL (ref 30.0–36.0)
MCV: 83.5 fl (ref 78.0–100.0)
Monocytes Absolute: 0.5 10*3/uL (ref 0.1–1.0)
Monocytes Relative: 6.9 % (ref 3.0–12.0)
NEUTROS PCT: 57.8 % (ref 43.0–77.0)
Neutro Abs: 3.8 10*3/uL (ref 1.4–7.7)
Platelets: 366 10*3/uL (ref 150.0–400.0)
RBC: 4.9 Mil/uL (ref 3.87–5.11)
RDW: 13.4 % (ref 11.5–15.5)
WBC: 6.7 10*3/uL (ref 4.0–10.5)

## 2015-08-16 LAB — TSH: TSH: 0.4 u[IU]/mL (ref 0.35–4.50)

## 2015-08-16 MED ORDER — LISINOPRIL 20 MG PO TABS: ORAL_TABLET | ORAL | Status: DC

## 2015-08-16 MED ORDER — DILTIAZEM HCL ER COATED BEADS 120 MG PO CP24: ORAL_CAPSULE | ORAL | Status: DC

## 2015-08-16 MED ORDER — THERA VITAL M PO TABS: 1.0000 | ORAL_TABLET | Freq: Every day | ORAL | Status: DC

## 2015-08-16 MED FILL — CARTIA XT 120 MG CAPSULE: 120 | 90 days supply | Qty: 90 | Fill #0

## 2015-08-16 MED FILL — LISINOPRIL 20 MG TABLET: 20 | 90 days supply | Qty: 90 | Fill #0

## 2015-08-16 NOTE — Patient Instructions (Signed)
  We have reviewed your prior records including labs and tests today.  Test(s) ordered today. Your results will be released to [place] (or called to you) after review, usually within 72hours after test completion. If any changes need to be made, you will be notified at that same time.  All other Health Maintenance issues reviewed.   All recommended immunizations and age-appropriate screenings are up-to-date.  No immunizations administered today.   Medications reviewed and updated.  No changes recommended at this time.  Your prescription(s) have been submitted to your pharmacy. Please take as directed and contact our office if you believe you are having problem(s) with the medication(s).   Please followup annually

## 2015-08-16 NOTE — Progress Notes (Signed)
Subjective:    Patient ID: Angie Garcia, female    DOB: 05-15-1951, 65 y.o.   MRN: [id number]  HPI She is here to establish with a new pcp.  She is here for physical exam.  On occasion when she eats something she gets gassy and her stomach hurts.  She is lactose intolerant.  She does get occasion GERD.  She odes not take any medication regularly for her GERD - she only takes medication on occasion.    She exercises regularly and denies chest pain, sob and palpitations.  She denies a history of gallbladder disease.   Medications and allergies reviewed with patient and updated if appropriate.  Patient Active Problem List   Diagnosis Date Noted  . Hypercalcemia 06/03/2014  . Abnormal thyroid function test 06/03/2014  . Hyperlipidemia 06/02/2014  . DJD (degenerative joint disease) of knee 04/01/2013  . Hemorrhoids, internal, with bleeding 07/04/2011  . GERD 04/07/2010  . LACTOSE INTOLERANCE 11/18/2009  . RHINITIS 11/18/2009  . History of herpes zoster 10/28/2008  . HYPERTENSION 01/24/2008    Current Outpatient Prescriptions on File Prior to Visit  Medication Sig Dispense Refill  . fluticasone (FLONASE) 50 MCG/ACT nasal spray Place 2 sprays into the nose daily. 16 g 1  . omeprazole (PRILOSEC OTC) 20 MG tablet Take 20 mg by mouth daily.    Marland Kitchen triamcinolone cream (KENALOG) 0.1 % Apply 1 application topically 2 (two) times daily as needed. 30 g 0   No current facility-administered medications on file prior to visit.    Past Medical History  Diagnosis Date  . Hypertension   . Shingles 10/2008    RUE  . Lactose intolerance     Past Surgical History  Procedure Laterality Date  . Abdominal hysterectomy      Dr Connye Burkitt for fibroids  . Pilonidal cystectomy    . Colonoscopy  12/11/2005    lymphoid aggregate removed (suspected polyp was not), melanosis coli    Social History   Social History  . Marital Status: Married    Spouse Name: N/A  . Number of Children: 1  . Years  of Education: N/A   Occupational History  . [employer]   Social History Main Topics  . Smoking status: Former Smoker    Quit date: 06/27/1995  . Smokeless tobacco: Never Used     Comment: smoked [age], up to < 1 ppd  . Alcohol Use: No  . Drug Use: No  . Sexual Activity: Not on file   Other Topics Concern  . Not on file   Social History Narrative   REGULAR EXERCISE    Family History  Problem Relation Age of Onset  . Hypertension Mother   . Breast cancer Mother   . Diabetes Father   . Stroke Father     MINI STROKES  . Stroke Sister 63  . Diabetes Sister   . Cancer Maternal Uncle     UNKNOWN TYPE  . Colon cancer Neg Hx     Review of Systems  Constitutional: Negative for fever, chills, appetite change and fatigue.  HENT: Negative for hearing loss.   Eyes: Negative for visual disturbance.  Respiratory: Negative for cough, shortness of breath and wheezing.   Cardiovascular: Negative for chest pain, palpitations and leg swelling.  Gastrointestinal: Negative for nausea, abdominal pain, diarrhea, constipation and blood in stool.       Occ GERD  Genitourinary: Negative for dysuria and hematuria.  Musculoskeletal: Positive for arthralgias (left  knee arthritis).  Skin: Negative for color change and rash.  Neurological: Negative for dizziness, light-headedness and headaches.  Psychiatric/Behavioral: Negative for dysphoric mood. The patient is not nervous/anxious.        Objective:   Filed Vitals:   08/16/15 0928  BP: 112/74  Pulse: 58  Temp: 98.3 F (36.8 C)  Resp: 16   Filed Weights   08/16/15 0928  Weight: 179 lb (81.194 kg)   Body mass index is 28.91 kg/(m^2).   Physical Exam Constitutional: She appears well-developed and well-nourished. No distress.  HENT:  Head: Normocephalic and atraumatic.  Right Ear: External ear normal. Normal ear canal and TM Left Ear: External ear normal.  Normal ear canal and TM Mouth/Throat:  Oropharynx is clear and moist.  Normal bilateral ear canals and tympanic membranes  Eyes: Conjunctivae and EOM are normal.  Neck: Neck supple. No tracheal deviation present. No thyromegaly present.  No carotid bruit  Cardiovascular: Normal rate, regular rhythm and normal heart sounds.   No murmur heard.  No edema. Pulmonary/Chest: Effort normal and breath sounds normal. No respiratory distress. She has no wheezes. She has no rales.  Breast: deferred to Gyn Abdominal: Soft. She exhibits no distension. There is no tenderness.  Lymphadenopathy: She has no cervical adenopathy.  Skin: Skin is warm and dry. She is not diaphoretic.  Psychiatric: She has a normal mood and affect. Her behavior is normal.         Assessment & Plan:   Physical exam: Screening blood work ordered Immunizations up to date Colonoscopy  Up to date Mammogram up to date Gyn - does not see - s/p hysterectomy Dexa  Deferred - will consider next year Eye exams - up to date  EKG - reviewed last EKG 2014 - normal, no need to repeat today Exercise - exercising regularly Weight - working on weight loss Skin - no concerns, no changes Substance abuse - no substance abuse   See Problem List for Assessment and Plan of chronic medical problems.  Follow up annually

## 2015-08-16 NOTE — Progress Notes (Signed)
Pre visit review using our clinic review tool, if applicable. No additional management support is needed unless otherwise documented below in the visit note. 

## 2015-08-16 NOTE — Assessment & Plan Note (Signed)
Not controlled and likely the cause of her current GI symptoms Revise diet, weight loss Avoid foods that cause symptoms No eating within 3 hrs of laying down If symptoms persistent asked her to let me know so we can get an abdominal US to rule out GB disease - she did not want to do that now

## 2015-08-16 NOTE — Assessment & Plan Note (Signed)
BP controlled Continue your current medication

## 2015-08-19 ENCOUNTER — Encounter: Payer: Self-pay | Admitting: Internal Medicine

## 2015-08-19 DIAGNOSIS — R7303 Prediabetes: Secondary | ICD-10-CM | POA: Insufficient documentation

## 2015-09-27 ENCOUNTER — Ambulatory Visit (INDEPENDENT_AMBULATORY_CARE_PROVIDER_SITE_OTHER): Payer: 59 | Admitting: Family Medicine

## 2015-09-27 ENCOUNTER — Encounter: Payer: Self-pay | Admitting: Family Medicine

## 2015-09-27 ENCOUNTER — Ambulatory Visit: Payer: 59 | Admitting: Internal Medicine

## 2015-09-27 VITALS — BP 122/80 | HR 74 | Ht 65.0 in | Wt 179.0 lb

## 2015-09-27 DIAGNOSIS — M1712 Unilateral primary osteoarthritis, left knee: Secondary | ICD-10-CM

## 2015-09-27 DIAGNOSIS — M25562 Pain in left knee: Secondary | ICD-10-CM | POA: Diagnosis not present

## 2015-09-27 NOTE — Assessment & Plan Note (Signed)
If any significant symptoms or worsening pain I would consider workup with parathyroid hormone and ionized calcium levels.

## 2015-09-27 NOTE — Patient Instructions (Addendum)
Good to see you.  Ice 20 minutes 2 times daily. Usually after activity and before bed. Xrays downstairs today  We may need to look into the calcium level being elevated. Can contribute to pain. We will discuss at follow up if needed.  Vitamin D 2000 IU daily  Turmeric 500mg  1-2 times daily can help with inflammation.  pennsaid pinkie amount topically 2 times daily as needed.  See me again in 4 weeks

## 2015-09-27 NOTE — Progress Notes (Signed)
Pre visit review using our clinic review tool, if applicable. No additional management support is needed unless otherwise documented below in the visit note. 

## 2015-09-27 NOTE — Progress Notes (Signed)
[facility] [place], [place] [address] Phone: [phone] Subjective:    I'm seeing this patient by the request  of:  Binnie Rail, MD   CC: Left knee pain  [id number] Angie Garcia is a 65 y.o. female coming in with complaint of left knee pain. Patient states this started a proximal wing 3 weeks ago. States that she had decreasing range of motion. Had some fullness of the knee. Does not remember any true injury. Rated the severity of 7 out of 10 and patient was ambulating with the limp. Patient states though over the course of time is improving. No longer having instability. Still some mild soreness on the posterior medial aspect of the knee. Patient states though that can do daily activities. Only hurts her with some squatting motions. Patient has been able to feel 85-90% of her regular baseline. Patient's family history does have a sister who had knee replacement recently. No radiation down the leg or any numbness. States that when it initially happened the knees seem to swell and cause some mild swelling down the leg. Swelling down the leg seemed to worsen over the course of 3 or 4 days and then resolved.     Past Medical History  Diagnosis Date  . Hypertension   . Shingles 10/2008    RUE  . Lactose intolerance    Past Surgical History  Procedure Laterality Date  . Abdominal hysterectomy      Dr Connye Burkitt for fibroids  . Pilonidal cystectomy    . Colonoscopy  12/11/2005    lymphoid aggregate removed (suspected polyp was not), melanosis coli   Social History   Social History  . Marital Status: Married    Spouse Name: N/A  . Number of Children: 1  . Years of Education: N/A   Occupational History  . [employer]   Social History Main Topics  . Smoking status: Former Smoker    Quit date: 06/27/1995  . Smokeless tobacco: Never Used     Comment: smoked [age], up to < 1 ppd  . Alcohol Use: No  .  Drug Use: No  . Sexual Activity: Not Asked   Other Topics Concern  . None   Social History Narrative   REGULAR EXERCISE   Allergies  Allergen Reactions  . Sulfamethoxazole-Trimethoprim     Rash Because of a history of documented adverse serious drug reaction;Medi Alert bracelet  is recommended   Family History  Problem Relation Age of Onset  . Hypertension Mother   . Breast cancer Mother   . Diabetes Father   . Stroke Father     MINI STROKES  . Stroke Sister 44  . Diabetes Sister   . Cancer Maternal Uncle     UNKNOWN TYPE  . Colon cancer Neg Hx     Past medical history, social, surgical and family history all reviewed in electronic medical record.  No pertanent information unless stated regarding to the chief complaint.   Review of Systems: No headache, visual changes, nausea, vomiting, diarrhea, constipation, dizziness, abdominal pain, skin rash, fevers, chills, night sweats, weight loss, swollen lymph nodes, body aches, joint swelling, muscle aches, chest pain, shortness of breath, mood changes.   Objective Blood pressure 122/80, pulse 74, height 5\' 5"  (1.651 m), weight 179 lb (81.194 kg), SpO2 96 %.  General: No apparent distress alert and oriented x3 mood and affect normal, dressed appropriately.  HEENT: Pupils equal, extraocular movements intact  Respiratory: Patient's speak in full sentences and does not appear short of breath  Cardiovascular: No lower extremity edema, non tender, no erythema  Skin: Warm dry intact with no signs of infection or rash on extremities or on axial skeleton.  Abdomen: Soft nontender  Neuro: Cranial nerves II through XII are intact, neurovascularly intact in all extremities with 2+ DTRs and 2+ pulses.  Lymph: No lymphadenopathy of posterior or anterior cervical chain or axillae bilaterally.  Gait normal with good balance and coordination.  MSK:  Non tender with full range of motion and good stability and symmetric strength and tone of  shoulders, elbows, wrist, hip, and ankles bilaterally. Arthritic changes noted of multiple joints Knee: Left knee Mild valgus deformity noted Mild tenderness over the medial joint line in the popliteal fossa ROM full in flexion and extension and lower leg rotation. Ligaments with solid consistent endpoints including ACL, PCL, LCL, MCL. Negative Mcmurray's, Apley's, and Thessalonian tests. Non painful patellar compression. Patellar glide with mild crepitus. Patellar and quadriceps tendons unremarkable. Hamstring and quadriceps strength is normal.      Impression and Recommendations:     This case required medical decision making of moderate complexity.      Note: This dictation was prepared with Dragon dictation along with smaller phrase technology. Any transcriptional errors that result from this process are unintentional.

## 2015-09-27 NOTE — Assessment & Plan Note (Signed)
I do believe the patient does have degenerative joint disease. I do think will be moderate to severe. X-rays are pending. We discussed topical anti-inflammatories and patient given a trial. We discussed over-the-counter medications. We discussed icing regimen. I do believe that based on what patient's symptoms were patient likely had a ruptured Baker's cyst. We discussed if any significant fullness happens in the popliteal fossa to come back and we will ultrasound and likely aspirate. Discuss if worsening symptoms injections could be helpful. I do not have any significant instability noted on exam today. If worsening we can consider bracing. Patient has had hypercalcemia and if any worsening symptoms I would consider further evaluation. Patient otherwise will come back and see me again on an as-needed basis.

## 2015-09-28 ENCOUNTER — Telehealth: Payer: Self-pay | Admitting: Family Medicine

## 2015-09-28 ENCOUNTER — Encounter: Payer: Self-pay | Admitting: *Deleted

## 2015-09-28 NOTE — Telephone Encounter (Signed)
Pt came in for appt yesterday & stated that her knee pain was much better. We will write note to excuse her from work on 4.3.2017.

## 2015-09-28 NOTE — Telephone Encounter (Signed)
Patient called to advise that she forgot to get a work note for the next few days. She states that she has taken off until 09/30/2015. She is asking [place] to type up a note and she will pick it up tomorrow when she comes in for the appt with dr burns.

## 2015-09-28 NOTE — Telephone Encounter (Signed)
Spoke to pt. She will pick up letter tomorrow.

## 2015-09-29 ENCOUNTER — Encounter: Payer: Self-pay | Admitting: Internal Medicine

## 2015-09-29 ENCOUNTER — Ambulatory Visit (INDEPENDENT_AMBULATORY_CARE_PROVIDER_SITE_OTHER): Payer: 59 | Admitting: Internal Medicine

## 2015-09-29 VITALS — BP 146/90 | HR 59 | Temp 98.3°F | Resp 16 | Wt 178.0 lb

## 2015-09-29 DIAGNOSIS — R1010 Upper abdominal pain, unspecified: Secondary | ICD-10-CM | POA: Diagnosis not present

## 2015-09-29 DIAGNOSIS — K219 Gastro-esophageal reflux disease without esophagitis: Secondary | ICD-10-CM | POA: Diagnosis not present

## 2015-09-29 MED ORDER — OMEPRAZOLE 40 MG PO CPDR: 40.0000 mg | DELAYED_RELEASE_CAPSULE | Freq: Every day | ORAL | Status: DC

## 2015-09-29 MED ORDER — RANITIDINE HCL 150 MG PO TABS: 150.0000 mg | ORAL_TABLET | Freq: Every day | ORAL | Status: DC

## 2015-09-29 MED FILL — raNITIdine HCL 150 MG TABS: 150 | 30 days supply | Qty: 30 | Fill #0

## 2015-09-29 MED FILL — OMEPRAZOLE DR 40 MG CAPSULE: 40 | 30 days supply | Qty: 30 | Fill #0

## 2015-09-29 NOTE — Progress Notes (Signed)
Pre visit review using our clinic review tool, if applicable. No additional management support is needed unless otherwise documented below in the visit note. 

## 2015-09-29 NOTE — Assessment & Plan Note (Signed)
Uncontrolled She has had GERD symptoms for a while, but they have been getting worse Is not taking a PPI Start omeprazole 40 mg daily Start Zantac at bedtime Can take over-the-counter antacids for now Information regarding GERD given. Discussed foods she needs to avoid Work on weight loss Advised her that the goal is ultimately to get her off of these medications

## 2015-09-29 NOTE — Patient Instructions (Signed)
Start omeprazole  - take 30 minutes before a meal daily.  Take the zantac at bedtime.  Adjust your diet.  We will check an Ultrasound to rule out gallbladder disease.  We will call you to set this up.   Gastroesophageal Reflux Disease, Adult Normally, food travels down the esophagus and stays in the stomach to be digested. However, when a person has gastroesophageal reflux disease (GERD), food and stomach acid move back up into the esophagus. When this happens, the esophagus becomes sore and inflamed. Over time, GERD can create small holes (ulcers) in the lining of the esophagus.  CAUSES This condition is caused by a problem with the muscle between the esophagus and the stomach (lower esophageal sphincter, or LES). Normally, the LES muscle closes after food passes through the esophagus to the stomach. When the LES is weakened or abnormal, it does not close properly, and that allows food and stomach acid to go back up into the esophagus. The LES can be weakened by certain dietary substances, medicines, and medical conditions, including:  Tobacco use.  Pregnancy.  Having a hiatal hernia.  Heavy alcohol use.  Certain foods and beverages, such as coffee, chocolate, onions, and peppermint. RISK FACTORS This condition is more likely to develop in:  People who have an increased body weight.  People who have connective tissue disorders.  People who use NSAID medicines. SYMPTOMS Symptoms of this condition include:  Heartburn.  Difficult or painful swallowing.  The feeling of having a lump in the throat.  Abitter taste in the mouth.  Bad breath.  Having a large amount of saliva.  Having an upset or bloated stomach.  Belching.  Chest pain.  Shortness of breath or wheezing.  Ongoing (chronic) cough or a night-time cough.  Wearing away of tooth enamel.  Weight loss. Different conditions can cause chest pain. Make sure to see your health care provider if you experience chest  pain. DIAGNOSIS Your health care provider will take a medical history and perform a physical exam. To determine if you have mild or severe GERD, your health care provider may also monitor how you respond to treatment. You may also have other tests, including:  An endoscopy toexamine your stomach and esophagus with a small camera.  A test thatmeasures the acidity level in your esophagus.  A test thatmeasures how much pressure is on your esophagus.  A barium swallow or modified barium swallow to show the shape, size, and functioning of your esophagus. TREATMENT The goal of treatment is to help relieve your symptoms and to prevent complications. Treatment for this condition may vary depending on how severe your symptoms are. Your health care provider may recommend:  Changes to your diet.  Medicine.  Surgery. HOME CARE INSTRUCTIONS Diet  Follow a diet as recommended by your health care provider. This may involve avoiding foods and drinks such as:  Coffee and tea (with or without caffeine).  Drinks that containalcohol.  Energy drinks and sports drinks.  Carbonated drinks or sodas.  Chocolate and cocoa.  Peppermint and mint flavorings.  Garlic and onions.  Horseradish.  Spicy and acidic foods, including peppers, chili powder, curry powder, vinegar, hot sauces, and barbecue sauce.  Citrus fruit juices and citrus fruits, such as oranges, lemons, and limes.  Tomato-based foods, such as red sauce, chili, salsa, and pizza with red sauce.  Fried and fatty foods, such as donuts, french fries, potato chips, and high-fat dressings.  High-fat meats, such as hot dogs and fatty cuts of  red and white meats, such as rib eye steak, sausage, ham, and bacon.  High-fat dairy items, such as whole milk, butter, and cream cheese.  Eat small, frequent meals instead of large meals.  Avoid drinking large amounts of liquid with your meals.  Avoid eating meals during the 2-3 hours before  bedtime.  Avoid lying down right after you eat.  Do not exercise right after you eat. General Instructions  Pay attention to any changes in your symptoms.  Take over-the-counter and prescription medicines only as told by your health care provider. Do not take aspirin, ibuprofen, or other NSAIDs unless your health care provider told you to do so.  Do not use any tobacco products, including cigarettes, chewing tobacco, and e-cigarettes. If you need help quitting, ask your health care provider.  Wear loose-fitting clothing. Do not wear anything tight around your waist that causes pressure on your abdomen.  Raise (elevate) the head of your bed 6 inches (15cm).  Try to reduce your stress, such as with yoga or meditation. If you need help reducing stress, ask your health care provider.  If you are overweight, reduce your weight to an amount that is healthy for you. Ask your health care provider for guidance about a safe weight loss goal.  Keep all follow-up visits as told by your health care provider. This is important. SEEK MEDICAL CARE IF:  You have new symptoms.  You have unexplained weight loss.  You have difficulty swallowing, or it hurts to swallow.  You have wheezing or a persistent cough.  Your symptoms do not improve with treatment.  You have a hoarse voice. SEEK IMMEDIATE MEDICAL CARE IF:  You have pain in your arms, neck, jaw, teeth, or back.  You feel sweaty, dizzy, or light-headed.  You have chest pain or shortness of breath.  You vomit and your vomit looks like blood or coffee grounds.  You faint.  Your stool is bloody or black.  You cannot swallow, drink, or eat.   This information is not intended to replace advice given to you by your health care provider. Make sure you discuss any questions you have with your health care provider.   Document Released: 03/22/2005 Document Revised: 03/03/2015 Document Reviewed: 10/07/2014 Elsevier Interactive Patient  Education [employer].

## 2015-09-29 NOTE — Progress Notes (Signed)
Subjective:    Patient ID: Angie Garcia, female    DOB: 1950/06/30, 65 y.o.   MRN: [id number]  HPI She is here for an acute visit for stomach symptoms.  GERD:  She has been taking alka seltzer and otc antacids.  She is not taking the omeprazole. She feels heartburn and stomach pain across her upper abdomen. She has had these symptoms for a while, but they are getting worse. Yesterday it bothered her and she was not able to eat, so she stayed home from work.  She has the symptoms more now than in the past.  The symptoms are more at night.  She denies daily symptoms.  She does experience radiating pain to the back at times.  Her symptoms are always related to food.  She denies any relation to activity. She is able to exercise without any chest pain or heartburn symptoms.   She takes aleve as needed for aches.  She last took it one week ago.  The alka seltzer medication has something in it for pain.   Medications and allergies reviewed with patient and updated if appropriate.  Patient Active Problem List   Diagnosis Date Noted  . Prediabetes 08/19/2015  . Hypercalcemia 06/03/2014  . Abnormal thyroid function test 06/03/2014  . Hyperlipidemia 06/02/2014  . DJD (degenerative joint disease) of knee 04/01/2013  . Hemorrhoids, internal, with bleeding 07/04/2011  . GERD 04/07/2010  . LACTOSE INTOLERANCE 11/18/2009  . RHINITIS 11/18/2009  . History of herpes zoster 10/28/2008  . Essential hypertension 01/24/2008    Current Outpatient Prescriptions on File Prior to Visit  Medication Sig Dispense Refill  . diltiazem (CARDIZEM CD) 120 MG 24 hr capsule TAKE 1 CAPSULE (120 MG TOTAL) BY MOUTH DAILY. 90 capsule 3  . fluticasone (FLONASE) 50 MCG/ACT nasal spray Place 2 sprays into the nose daily. 16 g 1  . lisinopril (PRINIVIL,ZESTRIL) 20 MG tablet TAKE 1 TABLET BY MOUTH DAILY (REPORT ANY SWELLING OF THE LIPS OR TONGUE IMMEDIATELY). 90 tablet 3  . Multiple Vitamins-Minerals (MULTIVITAMIN) tablet  Take 1 tablet by mouth daily.    Marland Kitchen omeprazole (PRILOSEC OTC) 20 MG tablet Take 20 mg by mouth daily.    Marland Kitchen triamcinolone cream (KENALOG) 0.1 % Apply 1 application topically 2 (two) times daily as needed. 30 g 0   No current facility-administered medications on file prior to visit.    Past Medical History  Diagnosis Date  . Hypertension   . Shingles 10/2008    RUE  . Lactose intolerance     Past Surgical History  Procedure Laterality Date  . Abdominal hysterectomy      Dr Connye Burkitt for fibroids  . Pilonidal cystectomy    . Colonoscopy  12/11/2005    lymphoid aggregate removed (suspected polyp was not), melanosis coli    Social History   Social History  . Marital Status: Married    Spouse Name: N/A  . Number of Children: 1  . Years of Education: N/A   Occupational History  . [employer]   Social History Main Topics  . Smoking status: Former Smoker    Quit date: 06/27/1995  . Smokeless tobacco: Never Used     Comment: smoked [age], up to < 1 ppd  . Alcohol Use: No  . Drug Use: No  . Sexual Activity: Not Asked   Other Topics Concern  . None   Social History Narrative   REGULAR EXERCISE    Family History  Problem Relation  Age of Onset  . Hypertension Mother   . Breast cancer Mother   . Diabetes Father   . Stroke Father     MINI STROKES  . Stroke Sister 52  . Diabetes Sister   . Cancer Maternal Uncle     UNKNOWN TYPE  . Colon cancer Neg Hx     Review of Systems  Constitutional: Negative for fever.  Respiratory: Negative for shortness of breath.   Cardiovascular: Positive for chest pain (with GERD only).  Gastrointestinal: Positive for nausea and abdominal pain. Negative for diarrhea, constipation and blood in stool (no melena).       GERD  Neurological: Positive for headaches. Negative for light-headedness.       Objective:   Filed Vitals:   09/29/15 1015  BP: 146/90  Pulse: 59  Temp: 98.3 F (36.8 C)  Resp: 16    Filed Weights   09/29/15 1015  Weight: 178 lb (80.74 kg)   Body mass index is 29.62 kg/(m^2).   Physical Exam Constitutional: Appears well-developed and well-nourished. No distress.  Neck: Neck supple. No tracheal deviation present. No thyromegaly present.  No carotid bruit. No cervical adenopathy.   Cardiovascular: Normal rate, regular rhythm and normal heart sounds.   No murmur heard.  No edema Pulmonary/Chest: Effort normal and breath sounds normal. No respiratory distress. No wheezes.  Abdomen: soft, non-tender, non-distended, no HSM       Assessment & Plan:   Her symptoms are likely related to GERD. I doubt any cardiac issue given that she is able to exercise without symptoms and her symptoms are relieved with over-the-counter antacids  She will call if her symptoms do not improve over the next week or if they worsen    See Problem List for Assessment and Plan of chronic medical problems.

## 2015-09-29 NOTE — Assessment & Plan Note (Signed)
Likely related to GERD, possible gastritis, but need to rule out gallbladder disease Will check ultrasound of the abdomen

## 2015-10-13 ENCOUNTER — Other Ambulatory Visit: Payer: 59

## 2015-10-14 ENCOUNTER — Other Ambulatory Visit: Payer: 59

## 2015-10-21 ENCOUNTER — Ambulatory Visit
Admission: RE | Admit: 2015-10-21 | Discharge: 2015-10-21 | Disposition: A | Discharge status: Home / Self Care | Payer: 59 | Source: Ambulatory Visit | Attending: Internal Medicine | Admitting: Internal Medicine

## 2015-10-21 ENCOUNTER — Other Ambulatory Visit: Payer: Self-pay | Admitting: Internal Medicine

## 2015-10-21 DIAGNOSIS — R109 Unspecified abdominal pain: Secondary | ICD-10-CM | POA: Diagnosis not present

## 2015-10-21 DIAGNOSIS — N281 Cyst of kidney, acquired: Secondary | ICD-10-CM

## 2015-10-21 DIAGNOSIS — K219 Gastro-esophageal reflux disease without esophagitis: Secondary | ICD-10-CM

## 2015-10-21 DIAGNOSIS — R1010 Upper abdominal pain, unspecified: Secondary | ICD-10-CM

## 2015-10-22 ENCOUNTER — Telehealth: Payer: Self-pay

## 2015-10-22 DIAGNOSIS — R1010 Upper abdominal pain, unspecified: Secondary | ICD-10-CM

## 2015-10-22 NOTE — Telephone Encounter (Signed)
Orders have been reentered.  

## 2015-10-22 NOTE — Telephone Encounter (Signed)
[facility] called and stated that the orders are in wrong for this patient. It should be MRI abdomen with and without.

## 2015-10-31 ENCOUNTER — Ambulatory Visit
Admission: RE | Admit: 2015-10-31 | Discharge: 2015-10-31 | Disposition: A | Discharge status: Home / Self Care | Payer: 59 | Source: Ambulatory Visit | Attending: Internal Medicine | Admitting: Internal Medicine

## 2015-10-31 DIAGNOSIS — R1010 Upper abdominal pain, unspecified: Secondary | ICD-10-CM

## 2015-10-31 DIAGNOSIS — N281 Cyst of kidney, acquired: Secondary | ICD-10-CM | POA: Diagnosis not present

## 2015-10-31 MED ORDER — GADOBENATE DIMEGLUMINE 529 MG/ML IV SOLN
17.0000 mL | Freq: Once | INTRAVENOUS | Status: AC | PRN
  Administered 2015-10-31: 17 mL via INTRAVENOUS

## 2015-11-23 MED FILL — LISINOPRIL 20 MG TABLET: 20 | 90 days supply | Qty: 90 | Fill #1

## 2015-11-23 MED FILL — CARTIA XT 120 MG CAPSULE: 120 | 90 days supply | Qty: 90 | Fill #1

## 2016-01-18 ENCOUNTER — Encounter: Payer: Self-pay | Admitting: Internal Medicine

## 2016-02-22 MED FILL — CARTIA XT 120 MG CAPSULE SA: 120 | 90 days supply | Qty: 90 | Fill #2

## 2016-02-22 MED FILL — LISINOPRIL 20 MG TABLET: 20 | 90 days supply | Qty: 90 | Fill #2

## 2016-04-10 ENCOUNTER — Other Ambulatory Visit: Payer: Self-pay | Admitting: Internal Medicine

## 2016-04-10 DIAGNOSIS — Z1231 Encounter for screening mammogram for malignant neoplasm of breast: Secondary | ICD-10-CM

## 2016-05-19 MED FILL — CARTIA XT 120 MG CAPSULE SA: 120 | 90 days supply | Qty: 90 | Fill #3

## 2016-05-19 MED FILL — LISINOPRIL 20 MG TABLET: 20 | 90 days supply | Qty: 90 | Fill #3

## 2016-05-22 ENCOUNTER — Ambulatory Visit
Admission: RE | Admit: 2016-05-22 | Discharge: 2016-05-22 | Disposition: A | Discharge status: Home / Self Care | Payer: 59 | Source: Ambulatory Visit | Attending: Internal Medicine | Admitting: Internal Medicine

## 2016-05-22 ENCOUNTER — Other Ambulatory Visit: Payer: Self-pay | Admitting: Internal Medicine

## 2016-05-22 DIAGNOSIS — Z1231 Encounter for screening mammogram for malignant neoplasm of breast: Secondary | ICD-10-CM

## 2016-08-16 ENCOUNTER — Other Ambulatory Visit: Payer: Self-pay | Admitting: Internal Medicine

## 2016-08-16 MED FILL — LISINOPRIL 20 MG TABLET: 20 | 90 days supply | Qty: 90 | Fill #0

## 2016-08-16 MED FILL — CARTIA XT 120 MG CAPSULE SA: 120 | 90 days supply | Qty: 90 | Fill #0

## 2016-09-05 DIAGNOSIS — H5203 Hypermetropia, bilateral: Secondary | ICD-10-CM | POA: Diagnosis not present

## 2016-09-19 ENCOUNTER — Encounter (HOSPITAL_COMMUNITY): Payer: Self-pay | Admitting: Emergency Medicine

## 2016-09-19 ENCOUNTER — Ambulatory Visit (HOSPITAL_COMMUNITY)
Admission: EM | Admit: 2016-09-19 | Discharge: 2016-09-19 | Disposition: A | Discharge status: Home / Self Care | Payer: 59 | Attending: Internal Medicine | Admitting: Internal Medicine

## 2016-09-19 DIAGNOSIS — J9801 Acute bronchospasm: Secondary | ICD-10-CM | POA: Diagnosis not present

## 2016-09-19 DIAGNOSIS — J301 Allergic rhinitis due to pollen: Secondary | ICD-10-CM

## 2016-09-19 MED ORDER — ALBUTEROL SULFATE HFA 108 (90 BASE) MCG/ACT IN AERS: 2.0000 | INHALATION_SPRAY | RESPIRATORY_TRACT | 0 refills | Status: DC | PRN

## 2016-09-19 MED ORDER — PREDNISONE 50 MG PO TABS: ORAL_TABLET | ORAL | 0 refills | Status: DC

## 2016-09-19 MED FILL — VENTOLIN HFA 90 MCG INHALER: 108 (90 BAS | 25 days supply | Qty: 18 | Fill #0

## 2016-09-19 MED FILL — predniSONE 50 MG TABS: 50 | 6 days supply | Qty: 6 | Fill #0

## 2016-09-19 NOTE — Discharge Instructions (Signed)
The sinus drainage: Down the back of your throat is causing a lot of your symptoms including cough and that choking feeling especially when you are lying down. Recommend taking antihistamines such as those listed below. Continue drinking water as you have been. Since you have some wheezing you are being provided with a prescription for albuterol inhaler and prednisone. This should also help with some of your sinus inflammation. Sudafed PE 10 mg every 4 to 6 hours as needed for congestion Allegra or Zyrtec daily as needed for drainage and runny nose. For stronger antihistamine may take Chlor-Trimeton 2 to 4 mg every 4 to 6 hours, may cause drowsiness. Saline nasal spray used frequently. Ibuprofen 600 mg every 6 hours as needed for pain, discomfort or fever. Drink plenty of fluids and stay well-hydrated. Flonase or Rhinocort nasal spray daily

## 2016-09-19 NOTE — ED Triage Notes (Signed)
Patient has congestion, hacking and coughing up clear phlegm.  Feels like congestion in throat.

## 2016-09-19 NOTE — ED Provider Notes (Signed)
CSN: [id number]     Arrival date & time 09/19/16  1524 History   First MD Initiated Contact with Patient 09/19/16 1554     Chief Complaint  Patient presents with  . URI   (Consider location/radiation/quality/duration/timing/severity/associated sxs/prior Treatment) 66 year old female with a history of seasonal allergies presents with a 5 day history of sneezing, a little bit of a headache, cough, nasal congestion and primarily PND that is worse at night. She has used Netty pot to help but with modest relief. Denies fever, chills or earache.      Past Medical History:  Diagnosis Date  . Hypertension   . Lactose intolerance   . Shingles 10/2008   RUE   Past Surgical History:  Procedure Laterality Date  . ABDOMINAL HYSTERECTOMY     Dr Connye Burkitt for fibroids  . COLONOSCOPY  12/11/2005   lymphoid aggregate removed (suspected polyp was not), melanosis coli  . PILONIDAL CYSTECTOMY     Family History  Problem Relation Age of Onset  . Hypertension Mother   . Breast cancer Mother   . Diabetes Father   . Stroke Father     MINI STROKES  . Stroke Sister 50  . Diabetes Sister   . Cancer Maternal Uncle     UNKNOWN TYPE  . Colon cancer Neg Hx    Social History  Substance Use Topics  . Smoking status: Former Smoker    Quit date: 06/27/1995  . Smokeless tobacco: Never Used     Comment: smoked [age], up to < 1 ppd  . Alcohol use No   OB History    No data available     Review of Systems  Constitutional: Negative for activity change, appetite change, chills, fatigue and fever.  HENT: Positive for congestion, postnasal drip, rhinorrhea and sneezing. Negative for facial swelling.   Eyes: Negative.   Respiratory: Positive for cough and wheezing.   Cardiovascular: Negative.   Gastrointestinal: Negative.   Musculoskeletal: Negative for neck pain and neck stiffness.  Skin: Negative for pallor and rash.  Neurological: Negative.   All other systems reviewed and are negative.   Allergies   Sulfamethoxazole-trimethoprim  Home Medications   Prior to Admission medications   Medication Sig Start Date End Date Taking? Authorizing Provider  albuterol (PROVENTIL HFA;VENTOLIN HFA) 108 (90 Base) MCG/ACT inhaler Inhale 2 puffs into the lungs every 4 (four) hours as needed for wheezing or shortness of breath. 09/19/16   Janne Napoleon, NP  diltiazem (CARTIA XT) 120 MG 24 hr capsule Take 1 capsule (120 mg total) by mouth daily. --- Office visit needed for further refills 08/16/16   Binnie Rail, MD  fluticasone [facility]) 50 MCG/ACT nasal spray Place 2 sprays into the nose daily. 05/16/12   Harden Mo, MD  lisinopril (PRINIVIL,ZESTRIL) 20 MG tablet TAKE 1 TABLET BY MOUTH DAILY --- Office visit needed for further refills 08/16/16   Binnie Rail, MD  Multiple Vitamins-Minerals (MULTIVITAMIN) tablet Take 1 tablet by mouth daily. 08/16/15   Binnie Rail, MD  omeprazole (PRILOSEC OTC) 20 MG tablet Take 20 mg by mouth daily.    Historical Provider, MD  omeprazole (PRILOSEC) 40 MG capsule Take 1 capsule (40 mg total) by mouth daily. Take 30 minutes prior to a meal. 09/29/15   Binnie Rail, MD  predniSONE (DELTASONE) 50 MG tablet 1 tab po daily for 6 days. Take with food. 09/19/16   Janne Napoleon, NP  ranitidine (ZANTAC) 150 MG tablet Take 1 tablet (150 mg total)  by mouth at bedtime. 09/29/15   Binnie Rail, MD  triamcinolone cream (KENALOG) 0.1 % Apply 1 application topically 2 (two) times daily as needed. 02/11/14   Melony Overly, MD   Meds Ordered and Administered this Visit  Medications - No data to display  BP (!) 148/86 (BP Location: Left Arm)   Pulse 65   Temp 98.5 F (36.9 C) (Oral)   Resp 16   SpO2 98%  No data found.   Physical Exam  Constitutional: She is oriented to person, place, and time. She appears well-developed and well-nourished. No distress.  HENT:  Head: Normocephalic and atraumatic.  Right Ear: External ear normal.  Left Ear: External ear normal.  Bilateral TMs are normal.  Oropharynx with minor erythema, cobblestoning and moderate amount of clear PND.  Eyes: EOM are normal. Pupils are equal, round, and reactive to light.  Neck: Normal range of motion. Neck supple.  Cardiovascular: Normal rate, regular rhythm, normal heart sounds and intact distal pulses.   Pulmonary/Chest: Effort normal. No respiratory distress. She has wheezes.  Bilateral expiratory coarseness.  Musculoskeletal: Normal range of motion. She exhibits no edema.  Lymphadenopathy:    She has no cervical adenopathy.  Neurological: She is alert and oriented to person, place, and time.  Skin: Skin is warm and dry. She is not diaphoretic.  Nursing note and vitals reviewed.   Urgent Care Course     Procedures (including critical care time)  Labs Review Labs Reviewed - No data to display  Imaging Review No results found.   Visual Acuity Review  Right Eye Distance:   Left Eye Distance:   Bilateral Distance:    Right Eye Near:   Left Eye Near:    Bilateral Near:         MDM   1. Acute seasonal allergic rhinitis due to pollen   2. Cough due to bronchospasm    The sinus drainage: Down the back of your throat is causing a lot of your symptoms including cough and that choking feeling especially when you are lying down. Recommend taking antihistamines such as those listed below. Continue drinking water as you have been. Since you have some wheezing you are being provided with a prescription for albuterol inhaler and prednisone. This should also help with some of your sinus inflammation. Sudafed PE 10 mg every 4 to 6 hours as needed for congestion Allegra or Zyrtec daily as needed for drainage and runny nose. For stronger antihistamine may take Chlor-Trimeton 2 to 4 mg every 4 to 6 hours, may cause drowsiness. Saline nasal spray used frequently. Ibuprofen 600 mg every 6 hours as needed for pain, discomfort or fever. Drink plenty of fluids and stay well-hydrated. Flonase or Rhinocort  nasal spray daily Meds ordered this encounter  Medications  . albuterol (PROVENTIL HFA;VENTOLIN HFA) 108 (90 Base) MCG/ACT inhaler    Sig: Inhale 2 puffs into the lungs every 4 (four) hours as needed for wheezing or shortness of breath.    Dispense:  1 Inhaler    Refill:  0    Order Specific Question:   Supervising Provider    Answer:   Sherlene Shams [[id number]]  . predniSONE (DELTASONE) 50 MG tablet    Sig: 1 tab po daily for 6 days. Take with food.    Dispense:  6 tablet    Refill:  0    Order Specific Question:   Supervising Provider    Answer:   Sherlene Shams [[id number]]  Janne Napoleon, NP 09/19/16 [phone]

## 2016-11-22 ENCOUNTER — Other Ambulatory Visit: Payer: Self-pay | Admitting: Internal Medicine

## 2016-11-23 ENCOUNTER — Other Ambulatory Visit: Payer: Self-pay | Admitting: Internal Medicine

## 2016-11-23 MED FILL — LISINOPRIL 20 MG TAB: 20 | 90 days supply | Qty: 90 | Fill #0 | Status: TO

## 2016-11-23 MED FILL — CARTIA XT 120 MG CAPSULE SA: 120 | 90 days supply | Qty: 90 | Fill #0 | Status: TO

## 2016-11-23 NOTE — Telephone Encounter (Signed)
Patient requesting refills on blood pressure medication to get her through until her next appt.  I have scheduled patient for 6/22.  Patient states she takes two BP medications but does not know the name of either.

## 2016-11-23 NOTE — Telephone Encounter (Signed)
Pt lweft msg on triage stating she has made appt for 6/22, but needing refills on her medications. Per chart refill was already done by [facility] assistant until appt...Johny Chess

## 2016-12-11 ENCOUNTER — Ambulatory Visit: Payer: 59 | Admitting: Internal Medicine

## 2016-12-12 ENCOUNTER — Encounter: Payer: 59 | Admitting: Internal Medicine

## 2016-12-15 ENCOUNTER — Encounter: Payer: Self-pay | Admitting: Internal Medicine

## 2016-12-15 ENCOUNTER — Ambulatory Visit (INDEPENDENT_AMBULATORY_CARE_PROVIDER_SITE_OTHER): Payer: 59 | Admitting: Internal Medicine

## 2016-12-15 ENCOUNTER — Other Ambulatory Visit (INDEPENDENT_AMBULATORY_CARE_PROVIDER_SITE_OTHER): Payer: 59

## 2016-12-15 VITALS — BP 108/68 | HR 70 | Temp 98.6°F | Resp 16 | Ht 65.0 in | Wt 185.0 lb

## 2016-12-15 DIAGNOSIS — I10 Essential (primary) hypertension: Secondary | ICD-10-CM

## 2016-12-15 DIAGNOSIS — E7800 Pure hypercholesterolemia, unspecified: Secondary | ICD-10-CM

## 2016-12-15 DIAGNOSIS — R7303 Prediabetes: Secondary | ICD-10-CM

## 2016-12-15 DIAGNOSIS — K219 Gastro-esophageal reflux disease without esophagitis: Secondary | ICD-10-CM | POA: Diagnosis not present

## 2016-12-15 DIAGNOSIS — Z1211 Encounter for screening for malignant neoplasm of colon: Secondary | ICD-10-CM

## 2016-12-15 DIAGNOSIS — Z0000 Encounter for general adult medical examination without abnormal findings: Secondary | ICD-10-CM | POA: Diagnosis not present

## 2016-12-15 LAB — CBC WITH DIFFERENTIAL/PLATELET
BASOS ABS: 0.1 10*3/uL (ref 0.0–0.1)
Basophils Relative: 1.1 % (ref 0.0–3.0)
Eosinophils Absolute: 0.2 10*3/uL (ref 0.0–0.7)
Eosinophils Relative: 2.8 % (ref 0.0–5.0)
HCT: 40.4 % (ref 36.0–46.0)
Hemoglobin: 12.9 g/dL (ref 12.0–15.0)
LYMPHS ABS: 2.5 10*3/uL (ref 0.7–4.0)
Lymphocytes Relative: 42.5 % (ref 12.0–46.0)
MCHC: 32 g/dL (ref 30.0–36.0)
MCV: 85.4 fl (ref 78.0–100.0)
MONO ABS: 0.5 10*3/uL (ref 0.1–1.0)
Monocytes Relative: 8.8 % (ref 3.0–12.0)
NEUTROS PCT: 44.8 % (ref 43.0–77.0)
Neutro Abs: 2.7 10*3/uL (ref 1.4–7.7)
Platelets: 329 10*3/uL (ref 150.0–400.0)
RBC: 4.73 Mil/uL (ref 3.87–5.11)
RDW: 13.2 % (ref 11.5–15.5)
WBC: 5.9 10*3/uL (ref 4.0–10.5)

## 2016-12-15 LAB — LIPID PANEL
CHOL/HDL RATIO: 4
Cholesterol: 209 mg/dL — ABNORMAL HIGH (ref 0–200)
HDL: 59.7 mg/dL (ref >39.00)
LDL Cholesterol: 127 mg/dL — ABNORMAL HIGH (ref 0–99)
NONHDL: 149.35
Triglycerides: 113 mg/dL (ref 0.0–149.0)
VLDL: 22.6 mg/dL (ref 0.0–40.0)

## 2016-12-15 LAB — TSH: TSH: 0.69 u[IU]/mL (ref 0.35–4.50)

## 2016-12-15 LAB — COMPREHENSIVE METABOLIC PANEL
ALK PHOS: 73 U/L (ref 39–117)
ALT: 12 U/L (ref 0–35)
AST: 15 U/L (ref 0–37)
Albumin: 4 g/dL (ref 3.5–5.2)
BILIRUBIN TOTAL: 0.3 mg/dL (ref 0.2–1.2)
BUN: 13 mg/dL (ref 6–23)
CO2: 27 mEq/L (ref 19–32)
CREATININE: 0.78 mg/dL (ref 0.40–1.20)
Calcium: 11.2 mg/dL — ABNORMAL HIGH (ref 8.4–10.5)
Chloride: 107 mEq/L (ref 96–112)
GFR: 95.62 mL/min (ref >60.00)
GLUCOSE: 92 mg/dL (ref 70–99)
Potassium: 4.2 mEq/L (ref 3.5–5.1)
Sodium: 140 mEq/L (ref 135–145)
TOTAL PROTEIN: 6.5 g/dL (ref 6.0–8.3)

## 2016-12-15 LAB — HEMOGLOBIN A1C: Hgb A1c MFr Bld: 6 % (ref 4.6–6.5)

## 2016-12-15 NOTE — Patient Instructions (Addendum)
Test(s) ordered today. Your results will be released to [place] (or called to you) after review, usually within 72hours after test completion. If any changes need to be made, you will be notified at that same time.  All other Health Maintenance issues reviewed.   All recommended immunizations and age-appropriate screenings are up-to-date or discussed.  No immunizations administered today.   Medications reviewed and updated.  No changes recommended at this time.   A referral was ordered for GI for a colonoscopy  Please followup in one year for a physical   Health Maintenance, Female Adopting a healthy lifestyle and getting preventive care can go a long way to promote health and wellness. Talk with your health care provider about what schedule of regular examinations is right for you. This is a good chance for you to check in with your provider about disease prevention and staying healthy. In between checkups, there are plenty of things you can do on your own. Experts have done a lot of research about which lifestyle changes and preventive measures are most likely to keep you healthy. Ask your health care provider for more information. Weight and diet Eat a healthy diet  Be sure to include plenty of vegetables, fruits, low-fat dairy products, and lean protein.  Do not eat a lot of foods high in solid fats, added sugars, or salt.  Get regular exercise. This is one of the most important things you can do for your health. ? Most adults should exercise for at least 150 minutes each week. The exercise should increase your heart rate and make you sweat (moderate-intensity exercise). ? Most adults should also do strengthening exercises at least twice a week. This is in addition to the moderate-intensity exercise.  Maintain a healthy weight  Body mass index (BMI) is a measurement that can be used to identify possible weight problems. It estimates body fat based on height and weight. Your health  care provider can help determine your BMI and help you achieve or maintain a healthy weight.  For females 23 years of age and older: ? A BMI below 18.5 is considered underweight. ? A BMI of 18.5 to 24.9 is normal. ? A BMI of 25 to 29.9 is considered overweight. ? A BMI of 30 and above is considered obese.  Watch levels of cholesterol and blood lipids  You should start having your blood tested for lipids and cholesterol at 64 years of age, then have this test every 5 years.  You may need to have your cholesterol levels checked more often if: ? Your lipid or cholesterol levels are high. ? You are older than 64 years of age. ? You are at high risk for heart disease.  Cancer screening Lung Cancer  Lung cancer screening is recommended for adults 66-57 years old who are at high risk for lung cancer because of a history of smoking.  A yearly low-dose CT scan of the lungs is recommended for people who: ? Currently smoke. ? Have quit within the past 15 years. ? Have at least a 30-pack-year history of smoking. A pack year is smoking an average of one pack of cigarettes a day for 1 year.  Yearly screening should continue until it has been 15 years since you quit.  Yearly screening should stop if you develop a health problem that would prevent you from having lung cancer treatment.  Breast Cancer  Practice breast self-awareness. This means understanding how your breasts normally appear and feel.  It also means  doing regular breast self-exams. Let your health care provider know about any changes, no matter how small.  If you are in your 20s or 30s, you should have a clinical breast exam (CBE) by a health care provider every 1-3 years as part of a regular health exam.  If you are 30 or older, have a CBE every year. Also consider having a breast X-ray (mammogram) every year.  If you have a family history of breast cancer, talk to your health care provider about genetic screening.  If you  are at high risk for breast cancer, talk to your health care provider about having an MRI and a mammogram every year.  Breast cancer gene (BRCA) assessment is recommended for women who have family members with BRCA-related cancers. BRCA-related cancers include: ? Breast. ? Ovarian. ? Tubal. ? Peritoneal cancers.  Results of the assessment will determine the need for genetic counseling and BRCA1 and BRCA2 testing.  Cervical Cancer Your health care provider may recommend that you be screened regularly for cancer of the pelvic organs (ovaries, uterus, and vagina). This screening involves a pelvic examination, including checking for microscopic changes to the surface of your cervix (Pap test). You may be encouraged to have this screening done every 3 years, beginning at age 39.  For women ages 35-65, health care providers may recommend pelvic exams and Pap testing every 3 years, or they may recommend the Pap and pelvic exam, combined with testing for human papilloma virus (HPV), every 5 years. Some types of HPV increase your risk of cervical cancer. Testing for HPV may also be done on women of any age with unclear Pap test results.  Other health care providers may not recommend any screening for nonpregnant women who are considered low risk for pelvic cancer and who do not have symptoms. Ask your health care provider if a screening pelvic exam is right for you.  If you have had past treatment for cervical cancer or a condition that could lead to cancer, you need Pap tests and screening for cancer for at least 20 years after your treatment. If Pap tests have been discontinued, your risk factors (such as having a new sexual partner) need to be reassessed to determine if screening should resume. Some women have medical problems that increase the chance of getting cervical cancer. In these cases, your health care provider may recommend more frequent screening and Pap tests.  Colorectal Cancer  This type  of cancer can be detected and often prevented.  Routine colorectal cancer screening usually begins at 64 years of age and continues through 64 years of age.  Your health care provider may recommend screening at an earlier age if you have risk factors for colon cancer.  Your health care provider may also recommend using home test kits to check for hidden blood in the stool.  A small camera at the end of a tube can be used to examine your colon directly (sigmoidoscopy or colonoscopy). This is done to check for the earliest forms of colorectal cancer.  Routine screening usually begins at age 33.  Direct examination of the colon should be repeated every 5-10 years through 64 years of age. However, you may need to be screened more often if early forms of precancerous polyps or small growths are found.  Skin Cancer  Check your skin from head to toe regularly.  Tell your health care provider about any new moles or changes in moles, especially if there is a change in a  mole's shape or color.  Also tell your health care provider if you have a mole that is larger than the size of a pencil eraser.  Always use sunscreen. Apply sunscreen liberally and repeatedly throughout the day.  Protect yourself by wearing long sleeves, pants, a wide-brimmed hat, and sunglasses whenever you are outside.  Heart disease, diabetes, and high blood pressure  High blood pressure causes heart disease and increases the risk of stroke. High blood pressure is more likely to develop in: ? People who have blood pressure in the high end of the normal range (130-139/85-89 mm Hg). ? People who are overweight or obese. ? People who are African American.  If you are 45-81 years of age, have your blood pressure checked every 3-5 years. If you are 38 years of age or older, have your blood pressure checked every year. You should have your blood pressure measured twice-once when you are at a hospital or clinic, and once when you  are not at a hospital or clinic. Record the average of the two measurements. To check your blood pressure when you are not at a hospital or clinic, you can use: ? An automated blood pressure machine at a pharmacy. ? A home blood pressure monitor.  If you are between 32 years and 64 years old, ask your health care provider if you should take aspirin to prevent strokes.  Have regular diabetes screenings. This involves taking a blood sample to check your fasting blood sugar level. ? If you are at a normal weight and have a low risk for diabetes, have this test once every three years after 64 years of age. ? If you are overweight and have a high risk for diabetes, consider being tested at a younger age or more often. Preventing infection Hepatitis B  If you have a higher risk for hepatitis B, you should be screened for this virus. You are considered at high risk for hepatitis B if: ? You were born in a country where hepatitis B is common. Ask your health care provider which countries are considered high risk. ? Your parents were born in a high-risk country, and you have not been immunized against hepatitis B (hepatitis B vaccine). ? You have HIV or AIDS. ? You use needles to inject street drugs. ? You live with someone who has hepatitis B. ? You have had sex with someone who has hepatitis B. ? You get hemodialysis treatment. ? You take certain medicines for conditions, including cancer, organ transplantation, and autoimmune conditions.  Hepatitis C  Blood testing is recommended for: ? Everyone born from [birth year] through 1963. ? Anyone with known risk factors for hepatitis C.  Sexually transmitted infections (STIs)  You should be screened for sexually transmitted infections (STIs) including gonorrhea and chlamydia if: ? You are sexually active and are younger than 64 years of age. ? You are older than 64 years of age and your health care provider tells you that you are at risk for this type of  infection. ? Your sexual activity has changed since you were last screened and you are at an increased risk for chlamydia or gonorrhea. Ask your health care provider if you are at risk.  If you do not have HIV, but are at risk, it may be recommended that you take a prescription medicine daily to prevent HIV infection. This is called pre-exposure prophylaxis (PrEP). You are considered at risk if: ? You are sexually active and do not regularly use condoms or  know the HIV status of your partner(s). ? You take drugs by injection. ? You are sexually active with a partner who has HIV.  Talk with your health care provider about whether you are at high risk of being infected with HIV. If you choose to begin PrEP, you should first be tested for HIV. You should then be tested every 3 months for as long as you are taking PrEP. Pregnancy  If you are premenopausal and you may become pregnant, ask your health care provider about preconception counseling.  If you may become pregnant, take 400 to 800 micrograms (mcg) of folic acid every day.  If you want to prevent pregnancy, talk to your health care provider about birth control (contraception). Osteoporosis and menopause  Osteoporosis is a disease in which the bones lose minerals and strength with aging. This can result in serious bone fractures. Your risk for osteoporosis can be identified using a bone density scan.  If you are 55 years of age or older, or if you are at risk for osteoporosis and fractures, ask your health care provider if you should be screened.  Ask your health care provider whether you should take a calcium or vitamin D supplement to lower your risk for osteoporosis.  Menopause may have certain physical symptoms and risks.  Hormone replacement therapy may reduce some of these symptoms and risks. Talk to your health care provider about whether hormone replacement therapy is right for you. Follow these instructions at home:  Schedule  regular health, dental, and eye exams.  Stay current with your immunizations.  Do not use any tobacco products including cigarettes, chewing tobacco, or electronic cigarettes.  If you are pregnant, do not drink alcohol.  If you are breastfeeding, limit how much and how often you drink alcohol.  Limit alcohol intake to no more than 1 drink per day for nonpregnant women. One drink equals 12 ounces of beer, 5 ounces of wine, or 1 ounces of hard liquor.  Do not use street drugs.  Do not share needles.  Ask your health care provider for help if you need support or information about quitting drugs.  Tell your health care provider if you often feel depressed.  Tell your health care provider if you have ever been abused or do not feel safe at home. This information is not intended to replace advice given to you by your health care provider. Make sure you discuss any questions you have with your health care provider. Document Released: 12/26/2010 Document Revised: 11/18/2015 Document Reviewed: 03/16/2015 Elsevier Interactive Patient Education  [employer].

## 2016-12-15 NOTE — Assessment & Plan Note (Signed)
BP well controlled Current regimen effective and well tolerated Continue current medications at current doses  

## 2016-12-15 NOTE — Assessment & Plan Note (Signed)
GERD with diet Not on any medication

## 2016-12-15 NOTE — Assessment & Plan Note (Signed)
Check a1c Low sugar / carb diet Stressed regular exercise, weight loss  

## 2016-12-15 NOTE — Progress Notes (Signed)
Subjective:    Patient ID: Angie Garcia, female    DOB: 07-30-1950, 66 y.o.   MRN: [id number]  HPI She is here for a physical exam.   She retired one week ago.  She has been exercising.  She is eating fairly healthy - she is working on portions.   Medications and allergies reviewed with patient and updated if appropriate.  Patient Active Problem List   Diagnosis Date Noted  . Cyst of left kidney 10/21/2015  . Pain of upper abdomen 09/29/2015  . Prediabetes 08/19/2015  . Hypercalcemia 06/03/2014  . Abnormal thyroid function test 06/03/2014  . Hyperlipidemia 06/02/2014  . DJD (degenerative joint disease) of knee 04/01/2013  . Hemorrhoids, internal, with bleeding 07/04/2011  . GERD 04/07/2010  . LACTOSE INTOLERANCE 11/18/2009  . RHINITIS 11/18/2009  . History of herpes zoster 10/28/2008  . Essential hypertension 01/24/2008    Current Outpatient Prescriptions on File Prior to Visit  Medication Sig Dispense Refill  . albuterol (PROVENTIL HFA;VENTOLIN HFA) 108 (90 Base) MCG/ACT inhaler Inhale 2 puffs into the lungs every 4 (four) hours as needed for wheezing or shortness of breath. 1 Inhaler 0  . diltiazem (CARTIA XT) 120 MG 24 hr capsule Take 1 capsule (120 mg total) by mouth daily. 90 capsule 0  . fluticasone (FLONASE) 50 MCG/ACT nasal spray Place 2 sprays into the nose daily. 16 g 1  . lisinopril (PRINIVIL,ZESTRIL) 20 MG tablet Take 1 tablet (20 mg total) by mouth daily. 90 tablet 0  . Multiple Vitamins-Minerals (MULTIVITAMIN) tablet Take 1 tablet by mouth daily.    Marland Kitchen omeprazole (PRILOSEC OTC) 20 MG tablet Take 20 mg by mouth daily.    Marland Kitchen omeprazole (PRILOSEC) 40 MG capsule Take 1 capsule (40 mg total) by mouth daily. Take 30 minutes prior to a meal. 30 capsule 5  . ranitidine (ZANTAC) 150 MG tablet Take 1 tablet (150 mg total) by mouth at bedtime. 30 tablet 5  . triamcinolone cream (KENALOG) 0.1 % Apply 1 application topically 2 (two) times daily as needed. 30 g 0   No  current facility-administered medications on file prior to visit.     Past Medical History:  Diagnosis Date  . Hypertension   . Lactose intolerance   . Shingles 10/2008   RUE    Past Surgical History:  Procedure Laterality Date  . ABDOMINAL HYSTERECTOMY     Dr Connye Burkitt for fibroids  . COLONOSCOPY  12/11/2005   lymphoid aggregate removed (suspected polyp was not), melanosis coli  . PILONIDAL CYSTECTOMY      Social History   Social History  . Marital status: Married    Spouse name: N/A  . Number of children: 1  . Years of education: N/A   Occupational History  . [employer]   Social History Main Topics  . Smoking status: Former Smoker    Quit date: 06/27/1995  . Smokeless tobacco: Never Used     Comment: smoked [age], up to < 1 ppd  . Alcohol use No  . Drug use: No  . Sexual activity: Not on file   Other Topics Concern  . Not on file   Social History Narrative   REGULAR EXERCISE    Family History  Problem Relation Age of Onset  . Hypertension Mother   . Breast cancer Mother   . Diabetes Father   . Stroke Father        MINI STROKES  . Stroke Sister 56  . Diabetes  Sister   . Cancer Maternal Uncle        UNKNOWN TYPE  . Colon cancer Neg Hx     Review of Systems  Constitutional: Negative for appetite change, chills, fatigue and fever.  Eyes: Negative for visual disturbance.  Respiratory: Negative for cough, shortness of breath and wheezing.   Cardiovascular: Negative for chest pain, palpitations and leg swelling.  Gastrointestinal: Negative for abdominal pain, blood in stool, constipation, diarrhea and nausea.       No gerd  Genitourinary: Negative for dysuria and hematuria.  Musculoskeletal: Positive for back pain (occasional). Negative for myalgias.  Skin: Negative for color change and rash.  Neurological: Negative for light-headedness and headaches.  Psychiatric/Behavioral: Negative for dysphoric mood. The patient is not  nervous/anxious.        Objective:   Vitals:   12/15/16 1406  BP: 108/68  Pulse: 70  Resp: 16  Temp: 98.6 F (37 C)   Filed Weights   12/15/16 1406  Weight: 185 lb (83.9 kg)   Body mass index is 30.79 kg/m.  Wt Readings from Last 3 Encounters:  12/15/16 185 lb (83.9 kg)  09/29/15 178 lb (80.7 kg)  09/27/15 179 lb (81.2 kg)     Physical Exam Constitutional: She appears well-developed and well-nourished. No distress.  HENT:  Head: Normocephalic and atraumatic.  Right Ear: External ear normal. Normal ear canal and TM Left Ear: External ear normal.  Normal ear canal and TM Mouth/Throat: Oropharynx is clear and moist.  Eyes: Conjunctivae and EOM are normal.  Neck: Neck supple. No tracheal deviation present. No thyromegaly present.  No carotid bruit  Cardiovascular: Normal rate, regular rhythm and normal heart sounds.   No murmur heard.  No edema. Pulmonary/Chest: Effort normal and breath sounds normal. No respiratory distress. She has no wheezes. She has no rales.  Breast: deferred to Gyn Abdominal: Soft. She exhibits no distension. There is no tenderness.  Lymphadenopathy: She has no cervical adenopathy.  Skin: Skin is warm and dry. She is not diaphoretic.  Psychiatric: She has a normal mood and affect. Her behavior is normal.         Assessment & Plan:   Physical exam: Screening blood work   ordered Immunizations  Up to date - discussed shingrix (zostavax done 2014) Colonoscopy - overdue for colonoscopy - last done 2017 - referred today Mammogram    Up to date  Gyn - no longer sees Dexa   - will wait until 38 Eye exams   Up to date  EKG   Done 2014 Exercise - regular - bicycle, treadmill, weights Weight - working on weight loss Skin no concerns Substance abuse  none  See Problem List for Assessment and Plan of chronic medical problems.

## 2016-12-15 NOTE — Assessment & Plan Note (Signed)
Check cmp, intact pth

## 2016-12-15 NOTE — Assessment & Plan Note (Signed)
Check lipids 

## 2016-12-18 LAB — PTH, INTACT AND CALCIUM
CALCIUM: 11.2 mg/dL — AB (ref 8.6–10.4)
PTH: 69 pg/mL — AB (ref 14–64)

## 2016-12-21 ENCOUNTER — Telehealth: Payer: Self-pay | Admitting: Internal Medicine

## 2016-12-21 NOTE — Telephone Encounter (Signed)
Noted  

## 2016-12-21 NOTE — Telephone Encounter (Signed)
Pt returned your call regarding her lab results. I gave her MD response. She expressed understanding and did not have any questions at this time. She said that she is going to hold off on the referral for now.

## 2017-02-05 ENCOUNTER — Telehealth: Payer: Self-pay | Admitting: Internal Medicine

## 2017-02-05 NOTE — Telephone Encounter (Signed)
We can recheck the blood work - ordered.

## 2017-02-05 NOTE — Telephone Encounter (Signed)
Patient would like to know when she should follow up with Dr. Quay Burow about calcium.  States she stopped taking a medication that had calcium in it after the last labs.  Patient states Dr. Quay Burow wanted her to go to a specialist.  States she wanted to check calcium again after stopping medication to see how levels were before going to a specialist.

## 2017-02-06 NOTE — Telephone Encounter (Signed)
Spoke with pt to inform.  

## 2017-02-12 ENCOUNTER — Other Ambulatory Visit: Payer: Self-pay | Admitting: Internal Medicine

## 2017-02-12 MED FILL — LISINOPRIL 20 MG TABS: 20 | 90 days supply | Qty: 90 | Fill #0

## 2017-02-12 MED FILL — CARTIA XT 120 MG CAPSULE SA: 120 | 90 days supply | Qty: 90 | Fill #0

## 2017-02-21 ENCOUNTER — Encounter: Payer: Self-pay | Admitting: Internal Medicine

## 2017-02-28 ENCOUNTER — Encounter: Payer: Self-pay | Admitting: Internal Medicine

## 2017-04-02 ENCOUNTER — Telehealth: Payer: Self-pay | Admitting: Internal Medicine

## 2017-04-02 NOTE — Telephone Encounter (Signed)
Pt called to tell us she received a flu shot at [facility] in [place] on September the 17th.

## 2017-04-02 NOTE — Telephone Encounter (Signed)
Documented

## 2017-04-09 ENCOUNTER — Other Ambulatory Visit: Payer: Self-pay | Admitting: Internal Medicine

## 2017-04-09 DIAGNOSIS — Z1231 Encounter for screening mammogram for malignant neoplasm of breast: Secondary | ICD-10-CM

## 2017-04-19 ENCOUNTER — Ambulatory Visit (AMBULATORY_SURGERY_CENTER): Payer: Self-pay

## 2017-04-19 VITALS — Ht 65.0 in | Wt 183.4 lb

## 2017-04-19 DIAGNOSIS — Z1211 Encounter for screening for malignant neoplasm of colon: Secondary | ICD-10-CM

## 2017-04-19 NOTE — Progress Notes (Signed)
Denies allergies to eggs or soy products. Denies complication of anesthesia or sedation. Denies use of weight loss medication. Denies use of O2.   Emmi instructions declined.  

## 2017-04-20 ENCOUNTER — Encounter: Payer: Self-pay | Admitting: Internal Medicine

## 2017-05-03 ENCOUNTER — Other Ambulatory Visit: Payer: Self-pay

## 2017-05-03 ENCOUNTER — Encounter: Payer: Self-pay | Admitting: Internal Medicine

## 2017-05-03 ENCOUNTER — Ambulatory Visit (AMBULATORY_SURGERY_CENTER): Payer: 59 | Admitting: Internal Medicine

## 2017-05-03 VITALS — BP 120/76 | HR 70 | Temp 97.1°F | Resp 13 | Ht 65.0 in | Wt 183.0 lb

## 2017-05-03 DIAGNOSIS — D122 Benign neoplasm of ascending colon: Secondary | ICD-10-CM

## 2017-05-03 DIAGNOSIS — Z1212 Encounter for screening for malignant neoplasm of rectum: Secondary | ICD-10-CM | POA: Diagnosis not present

## 2017-05-03 DIAGNOSIS — Z1211 Encounter for screening for malignant neoplasm of colon: Secondary | ICD-10-CM

## 2017-05-03 MED ORDER — SODIUM CHLORIDE 0.9 % IV SOLN: 500.0000 mL | INTRAVENOUS | Status: DC

## 2017-05-03 NOTE — Progress Notes (Signed)
Pt's states no medical or surgical changes since previsit or office visit. 

## 2017-05-03 NOTE — Progress Notes (Signed)
Called to room to assist during endoscopic procedure.  Patient ID and intended procedure confirmed with present staff. Received instructions for my participation in the procedure from the performing physician.  

## 2017-05-03 NOTE — Patient Instructions (Addendum)
I found and removed one small polyp that looks benign. I will let you know pathology results and when to have another routine colonoscopy by mail and/or My Chart.  Keep enjoying retirement!  I appreciate the opportunity to care for you. Gatha Mayer, MD, [facility]   Discharge instructions given. Handout on polyps. Resume previous medications. YOU HAD AN ENDOSCOPIC PROCEDURE TODAY AT [facility]:   Refer to the procedure report that was given to you for any specific questions about what was found during the examination.  If the procedure report does not answer your questions, please call your gastroenterologist to clarify.  If you requested that your care partner not be given the details of your procedure findings, then the procedure report has been included in a sealed envelope for you to review at your convenience later.  YOU SHOULD EXPECT: Some feelings of bloating in the abdomen. Passage of more gas than usual.  Walking can help get rid of the air that was put into your GI tract during the procedure and reduce the bloating. If you had a lower endoscopy (such as a colonoscopy or flexible sigmoidoscopy) you may notice spotting of blood in your stool or on the toilet paper. If you underwent a bowel prep for your procedure, you may not have a normal bowel movement for a few days.  Please Note:  You might notice some irritation and congestion in your nose or some drainage.  This is from the oxygen used during your procedure.  There is no need for concern and it should clear up in a day or so.  SYMPTOMS TO REPORT IMMEDIATELY:   Following lower endoscopy (colonoscopy or flexible sigmoidoscopy):  Excessive amounts of blood in the stool  Significant tenderness or worsening of abdominal pains  Swelling of the abdomen that is new, acute  Fever of 100F or higher  For urgent or emergent issues, a gastroenterologist can be reached at any hour by calling [phone]  [phone].   DIET:  We do recommend a small meal at first, but then you may proceed to your regular diet.  Drink plenty of fluids but you should avoid alcoholic beverages for 24 hours.  ACTIVITY:  You should plan to take it easy for the rest of today and you should NOT DRIVE or use heavy machinery until tomorrow (because of the sedation medicines used during the test).    FOLLOW UP: Our staff will call the number listed on your records the next business day following your procedure to check on you and address any questions or concerns that you may have regarding the information given to you following your procedure. If we do not reach you, we will leave a message.  However, if you are feeling well and you are not experiencing any problems, there is no need to return our call.  We will assume that you have returned to your regular daily activities without incident.  If any biopsies were taken you will be contacted by phone or by letter within the next 1-3 weeks.  Please call us at [phone] if you have not heard about the biopsies in 3 weeks.    SIGNATURES/CONFIDENTIALITY: You and/or your care partner have signed paperwork which will be entered into your electronic medical record.  These signatures attest to the fact that that the information above on your After Visit Summary has been reviewed and is understood.  Full responsibility of the confidentiality of this discharge information lies with you and/or  your care-partner. 

## 2017-05-03 NOTE — Op Note (Signed)
[facility] Patient Name: Angie Garcia Procedure Date: 05/03/2017 9:39 AM MRN: [id number] Endoscopist: Gatha Mayer , MD Age: 65 Referring MD:  Date of Birth: 1950-11-01 Gender: Female Account #: [id number] Procedure:                Colonoscopy Indications:              Screening for colorectal malignant neoplasm, Last                            colonoscopy: 2007 Medicines:                Propofol per Anesthesia, Monitored Anesthesia Care Procedure:                Pre-Anesthesia Assessment:                           - Prior to the procedure, a History and Physical                            was performed, and patient medications and                            allergies were reviewed. The patient's tolerance of                            previous anesthesia was also reviewed. The risks                            and benefits of the procedure and the sedation                            options and risks were discussed with the patient.                            All questions were answered, and informed consent                            was obtained. Prior Anticoagulants: The patient has                            taken no previous anticoagulant or antiplatelet                            agents. ASA Grade Assessment: II - A patient with                            mild systemic disease. After reviewing the risks                            and benefits, the patient was deemed in                            satisfactory condition to undergo the procedure.  After obtaining informed consent, the colonoscope                            was passed under direct vision. Throughout the                            procedure, the patient's blood pressure, pulse, and                            oxygen saturations were monitored continuously. The                            Colonoscope was introduced through the anus and                            advanced to the the  cecum, identified by                            appendiceal orifice and ileocecal valve. The                            Colonoscope was introduced through the and advanced                            to the. The colonoscopy was performed without                            difficulty. The patient tolerated the procedure                            well. The quality of the bowel preparation was                            excellent. The bowel preparation used was Miralax.                            The ileocecal valve, appendiceal orifice, and                            rectum were photographed. Scope In: 9:48:03 AM Scope Out: 10:05:31 AM Scope Withdrawal Time: 0 hours 13 minutes 47 seconds  Total Procedure Duration: 0 hours 17 minutes 28 seconds  Findings:                 The perianal and digital rectal examinations were                            normal.                           A 5 mm polyp was found in the ascending colon. The                            polyp was sessile. The polyp was removed with a  cold snare. Resection and retrieval were complete.                            Verification of patient identification for the                            specimen was done. Estimated blood loss was minimal.                           The exam was otherwise without abnormality on                            direct and retroflexion views. Complications:            No immediate complications. Estimated Blood Loss:     Estimated blood loss was minimal. Impression:               - One 6 mm polyp in the ascending colon, removed                            with a cold snare. Resected and retrieved.                           - The examination was otherwise normal on direct                            and retroflexion views. Recommendation:           - Patient has a contact number available for                            emergencies. The signs and symptoms of potential                             delayed complications were discussed with the                            patient. Return to normal activities tomorrow.                            Written discharge instructions were provided to the                            patient.                           - Resume previous diet.                           - Continue present medications.                           - Repeat colonoscopy is recommended for                            surveillance. The colonoscopy date will be  determined after pathology results from today's                            exam become available for review. Gatha Mayer, MD 05/03/2017 10:11:27 AM This report has been signed electronically.

## 2017-05-03 NOTE — Progress Notes (Signed)
A/ox3 pleased with MAC, report to [facility]

## 2017-05-04 ENCOUNTER — Telehealth: Payer: Self-pay

## 2017-05-04 NOTE — Telephone Encounter (Signed)
  Follow up Call-  Call back number 05/03/2017  Post procedure Call Back phone  # [phone]  Permission to leave phone message Yes  Some recent data might be hidden     Patient questions:  Do you have a fever, pain , or abdominal swelling? No. Pain Score  0 *  Have you tolerated food without any problems? Yes.    Have you been able to return to your normal activities? Yes.    Do you have any questions about your discharge instructions: Diet   No. Medications  No. Follow up visit  No.  Do you have questions or concerns about your Care? No.  Actions: * If pain score is 4 or above: No action needed, pain <4.

## 2017-05-09 ENCOUNTER — Encounter: Payer: Self-pay | Admitting: Internal Medicine

## 2017-05-09 DIAGNOSIS — Z86010 Personal history of colonic polyps: Secondary | ICD-10-CM

## 2017-05-09 DIAGNOSIS — Z860101 Personal history of adenomatous and serrated colon polyps: Secondary | ICD-10-CM

## 2017-05-09 HISTORY — DX: Personal history of adenomatous and serrated colon polyps: Z86.0101

## 2017-05-09 HISTORY — DX: Personal history of colonic polyps: Z86.010

## 2017-05-09 NOTE — Progress Notes (Signed)
5 mm tub adenoma Recall 7 yrs My Chart letter

## 2017-05-21 ENCOUNTER — Other Ambulatory Visit: Payer: Self-pay | Admitting: Internal Medicine

## 2017-05-21 MED FILL — CARTIA XT 120 MG CAPSULE SA: 120 | 90 days supply | Qty: 90 | Fill #0

## 2017-05-21 MED FILL — LISINOPRIL 20 MG TABLET: 20 | 90 days supply | Qty: 90 | Fill #0

## 2017-05-24 ENCOUNTER — Ambulatory Visit (HOSPITAL_BASED_OUTPATIENT_CLINIC_OR_DEPARTMENT_OTHER)
Admission: RE | Admit: 2017-05-24 | Discharge: 2017-05-24 | Disposition: A | Discharge status: Home / Self Care | Payer: 59 | Source: Ambulatory Visit | Attending: Internal Medicine | Admitting: Internal Medicine

## 2017-05-24 DIAGNOSIS — Z1231 Encounter for screening mammogram for malignant neoplasm of breast: Secondary | ICD-10-CM | POA: Diagnosis not present

## 2017-08-15 MED FILL — LISINOPRIL 20 MG TABLET: 20 | 90 days supply | Qty: 90 | Fill #1

## 2017-08-15 MED FILL — CARTIA XT 120 MG CAPSULE SA: 120 | 90 days supply | Qty: 90 | Fill #1

## 2017-08-28 ENCOUNTER — Other Ambulatory Visit: Payer: Self-pay

## 2017-08-28 ENCOUNTER — Ambulatory Visit (HOSPITAL_COMMUNITY)
Admission: EM | Admit: 2017-08-28 | Discharge: 2017-08-28 | Disposition: A | Discharge status: Home / Self Care | Payer: 59 | Attending: Family Medicine | Admitting: Family Medicine

## 2017-08-28 ENCOUNTER — Encounter (HOSPITAL_COMMUNITY): Payer: Self-pay | Admitting: Emergency Medicine

## 2017-08-28 ENCOUNTER — Ambulatory Visit (INDEPENDENT_AMBULATORY_CARE_PROVIDER_SITE_OTHER): Payer: 59

## 2017-08-28 DIAGNOSIS — J22 Unspecified acute lower respiratory infection: Secondary | ICD-10-CM

## 2017-08-28 DIAGNOSIS — R05 Cough: Secondary | ICD-10-CM | POA: Diagnosis not present

## 2017-08-28 MED ORDER — FLUTICASONE PROPIONATE 50 MCG/ACT NA SUSP: 1.0000 | Freq: Every day | NASAL | 2 refills | Status: DC

## 2017-08-28 MED ORDER — AZITHROMYCIN 250 MG PO TABS: ORAL_TABLET | ORAL | 0 refills | Status: AC

## 2017-08-28 MED ORDER — CETIRIZINE HCL 10 MG PO TABS: 10.0000 mg | ORAL_TABLET | Freq: Every day | ORAL | 0 refills | Status: DC

## 2017-08-28 MED ORDER — PREDNISONE 20 MG PO TABS: 40.0000 mg | ORAL_TABLET | Freq: Every day | ORAL | 0 refills | Status: AC

## 2017-08-28 MED FILL — AZITHROMYCIN 250 MG TABLET: 250 | 5 days supply | Qty: 6 | Fill #0

## 2017-08-28 MED FILL — FLUTICASONE PROP 50 MCG SPR: 50 | 60 days supply | Qty: 16 | Fill #0

## 2017-08-28 MED FILL — predniSONE 20 MG TABS: 20 | 5 days supply | Qty: 10 | Fill #0

## 2017-08-28 NOTE — Discharge Instructions (Signed)
Push fluids to ensure adequate hydration and keep secretions thin.  Tylenol and/or ibuprofen as needed for pain or fevers.  Complete course of antibiotics.  Daily zyrtec and flonase to help with congestion symptoms, or may continue with sudafed as needed. 5 days of prednisone to help with cough and wheezing. Use of inhaler as needed for wheezing. If symptoms worsen or do not improve in the next week to return to be seen or to follow up with PCP.

## 2017-08-28 NOTE — ED Provider Notes (Signed)
[name]    CSN: [id number] Arrival date & time: 08/28/17  [place]     History   Chief Complaint Chief Complaint  Patient presents with  . Cough    HPI Angie Garcia is a 67 y.o. female.   Angie Garcia presents with complaints of productive cough which has been ongoing for the past 2 weeks. Denies shortness of breath ,cp, fevers, back pain, sore throat or ear pain. States she originally had headache and sneezing which has improved. Husband also with cough. Has been using her inhaler which does help. Has been taking sudafed which has also helped. History of allergies, arthritis, htn, gerd.   ROS per HPI.       Past Medical History:  Diagnosis Date  . Allergy   . Arthritis   . Baker's cyst    Left knee  . Hx of adenomatous polyp of colon 05/09/2017  . Hypertension   . Lactose intolerance   . Shingles 10/2008   RUE    Patient Active Problem List   Diagnosis Date Noted  . Hx of adenomatous polyp of colon 05/09/2017  . Cyst of left kidney 10/21/2015  . Prediabetes 08/19/2015  . Hypercalcemia 06/03/2014  . Hyperlipidemia 06/02/2014  . DJD (degenerative joint disease) of knee 04/01/2013  . Hemorrhoids, internal, with bleeding 07/04/2011  . GERD 04/07/2010  . LACTOSE INTOLERANCE 11/18/2009  . RHINITIS 11/18/2009  . History of herpes zoster 10/28/2008  . Essential hypertension 01/24/2008    Past Surgical History:  Procedure Laterality Date  . ABDOMINAL HYSTERECTOMY     Dr Connye Burkitt for fibroids  . COLONOSCOPY  12/11/2005   lymphoid aggregate removed (suspected polyp was not), melanosis coli  . PILONIDAL CYSTECTOMY      OB History    No data available       Home Medications    Prior to Admission medications   Medication Sig Start Date End Date Taking? Authorizing Provider  Multiple Vitamin (MULTIVITAMIN) capsule Take 1 capsule by mouth daily.   Yes [provider]  albuterol (PROVENTIL HFA;VENTOLIN HFA) 108 (90 Base) MCG/ACT inhaler Inhale  2 puffs into the lungs every 4 (four) hours as needed for wheezing or shortness of breath. 09/19/16   Janne Napoleon, NP  azithromycin (ZITHROMAX) 250 MG tablet Take 2 tablets (500 mg total) by mouth daily for 1 day, THEN 1 tablet (250 mg total) daily for 4 days. 08/28/17 09/02/17  Zigmund Gottron, NP  CARTIA XT 120 MG 24 hr capsule TAKE 1 CAPSULE BY MOUTH ONCE DAILY 05/21/17   Binnie Rail, MD  cetirizine (ZYRTEC) 10 MG tablet Take 1 tablet (10 mg total) by mouth daily. 08/28/17   Zigmund Gottron, NP  fluticasone (FLONASE) 50 MCG/ACT nasal spray Place 1 spray into both nostrils daily. 08/28/17   Augusto Gamble B, NP  lisinopril (PRINIVIL,ZESTRIL) 20 MG tablet TAKE 1 TABLET BY MOUTH ONCE DAILY 05/21/17   Binnie Rail, MD  OVER THE COUNTER MEDICATION Tumeric one capsule a day.    [provider]  predniSONE (DELTASONE) 20 MG tablet Take 2 tablets (40 mg total) by mouth daily with breakfast for 5 days. 08/28/17 09/02/17  Zigmund Gottron, NP    Family History Family History  Problem Relation Age of Onset  . Hypertension Mother   . Breast cancer Mother   . Diabetes Father   . Stroke Father        MINI STROKES  . Stroke Sister 92  . Diabetes Sister   .  Cancer Maternal Uncle        UNKNOWN TYPE  . Colon cancer Neg Hx   . Esophageal cancer Neg Hx   . Pancreatic cancer Neg Hx   . Rectal cancer Neg Hx   . Stomach cancer Neg Hx     Social History Social History   Tobacco Use  . Smoking status: Former Smoker    Last attempt to quit: 06/27/1995    Years since quitting: 22.1  . Smokeless tobacco: Never Used  . Tobacco comment: smoked [age], up to < 1 ppd  Substance Use Topics  . Alcohol use: No  . Drug use: No     Allergies   Sulfamethoxazole-trimethoprim   Review of Systems Review of Systems   Physical Exam Triage Vital Signs ED Triage Vitals [08/28/17 1650]  Enc Vitals Group     BP (!) 162/88     Pulse Rate (!) 57     Resp      Temp 98 F (36.7 C)     Temp Source  Oral     SpO2 95 %     Weight      Height      Head Circumference      Peak Flow      Pain Score      Pain Loc      Pain Edu?      Excl. in [place]?    No data found.  Updated Vital Signs BP (!) 162/88 (BP Location: Right Arm)   Pulse (!) 57   Temp 98 F (36.7 C) (Oral)   SpO2 95%   Visual Acuity Right Eye Distance:   Left Eye Distance:   Bilateral Distance:    Right Eye Near:   Left Eye Near:    Bilateral Near:     Physical Exam  Constitutional: She is oriented to person, place, and time. She appears well-developed and well-nourished. No distress.  HENT:  Head: Normocephalic and atraumatic.  Right Ear: Tympanic membrane, external ear and ear canal normal.  Left Ear: Tympanic membrane, external ear and ear canal normal.  Nose: Mucosal edema and rhinorrhea present. Right sinus exhibits no maxillary sinus tenderness and no frontal sinus tenderness. Left sinus exhibits no maxillary sinus tenderness and no frontal sinus tenderness.  Mouth/Throat: Uvula is midline, oropharynx is clear and moist and mucous membranes are normal. No tonsillar exudate.  Eyes: Conjunctivae and EOM are normal. Pupils are equal, round, and reactive to light.  Cardiovascular: Normal rate, regular rhythm and normal heart sounds.  Pulmonary/Chest: Effort normal and breath sounds normal.  Occasional course lung sounds noted throughout; course cough noted  Neurological: She is alert and oriented to person, place, and time.  Skin: Skin is warm and dry.     UC Treatments / Results  Labs (all labs ordered are listed, but only abnormal results are displayed) Labs Reviewed - No data to display  EKG  EKG Interpretation None       Radiology Dg Chest 2 View  Result Date: 08/28/2017 CLINICAL DATA:  Cough and wheezing for 2 weeks. Flu like symptoms. Hypertension. EXAM: CHEST - 2 VIEW COMPARISON:  None. FINDINGS: Lateral view degraded by patient arm position. Midline trachea. Borderline cardiomegaly.  Mediastinal contours otherwise within normal limits. No pleural effusion or pneumothorax. Clear lungs. IMPRESSION: No acute cardiopulmonary disease. Electronically Signed   By: Abigail Miyamoto M.D.   On: 08/28/2017 17:24    Procedures Procedures (including critical care time)  Medications Ordered in UC Medications -  No data to display   Initial Impression / Assessment and Plan / UC Course  I have reviewed the triage vital signs and the nursing notes.  Pertinent labs & imaging results that were available during my care of the patient were reviewed by me and considered in my medical decision making (see chart for details).     Chest xray without acute findings at this time. 2 weeks of productive cough and congestion, will cover with azithromycin at this time. Prednisone x5 days. Discussed supportive cares for symptoms. If symptoms worsen or do not improve in the next week to return to be seen or to follow up with PCP.  Patient verbalized understanding and agreeable to plan.    Final Clinical Impressions(s) / UC Diagnoses   Final diagnoses:  Lower respiratory tract infection    ED Discharge Orders        Ordered    fluticasone (FLONASE) 50 MCG/ACT nasal spray  Daily     08/28/17 1731    cetirizine (ZYRTEC) 10 MG tablet  Daily     08/28/17 1731    predniSONE (DELTASONE) 20 MG tablet  Daily with breakfast     08/28/17 1731    azithromycin (ZITHROMAX) 250 MG tablet     08/28/17 1731       Controlled Substance Prescriptions [place] Controlled Substance Registry consulted? Not Applicable   Zigmund Gottron, NP 08/28/17 1742

## 2017-08-28 NOTE — ED Triage Notes (Signed)
C/o productive cough, wheezing and HA onset 2 weeks

## 2017-11-15 ENCOUNTER — Other Ambulatory Visit: Payer: Self-pay | Admitting: Internal Medicine

## 2017-11-15 MED FILL — LISINOPRIL 20 MG TABLET: 20 | 90 days supply | Qty: 90 | Fill #0

## 2017-11-15 MED FILL — CARTIA XT 120 MG CAPSULE SA: 120 | 90 days supply | Qty: 90 | Fill #0

## 2017-11-20 DIAGNOSIS — H5203 Hypermetropia, bilateral: Secondary | ICD-10-CM | POA: Diagnosis not present

## 2017-12-17 NOTE — Patient Instructions (Addendum)
Angie Garcia , Thank you for taking time to come for your Medicare Wellness Visit. I appreciate your ongoing commitment to your health goals. Please review the following plan we discussed and let me know if I can assist you in the future.   These are the goals we discussed: Goals    None      This is a list of the screening recommended for you and due dates:  Health Maintenance  Topic Date Due  . DEXA scan (bone density measurement)  12/12/2017  . Pneumonia vaccines (1 of 2 - PCV13) 12/19/2018*  . Flu Shot  01/24/2018  . Mammogram  05/25/2019  . Colon Cancer Screening  05/03/2024  . Tetanus Vaccine  08/15/2025  .  Hepatitis C: One time screening is recommended by Center for Disease Control  (CDC) for  adults born from [birth year] through 1963.   Completed  . HIV Screening  Completed  *Topic was postponed. The date shown is not the original due date.    Test(s) ordered today. Your results will be released to [facility] (or called to you) after review, usually within 72hours after test completion. If any changes need to be made, you will be notified at that same time.  All other Health Maintenance issues reviewed.   All recommended immunizations and age-appropriate screenings are up-to-date or discussed.  No immunizations administered today.   Medications reviewed and updated.  No changes recommended at this time.  Your prescription(s) have been submitted to your pharmacy. Please take as directed and contact our office if you believe you are having problem(s) with the medication(s).   Please followup in one year   Health Maintenance, Female Adopting a healthy lifestyle and getting preventive care can go a long way to promote health and wellness. Talk with your health care provider about what schedule of regular examinations is right for you. This is a good chance for you to check in with your provider about disease prevention and staying healthy. In between checkups, there are plenty of  things you can do on your own. Experts have done a lot of research about which lifestyle changes and preventive measures are most likely to keep you healthy. Ask your health care provider for more information. Weight and diet Eat a healthy diet  Be sure to include plenty of vegetables, fruits, low-fat dairy products, and lean protein.  Do not eat a lot of foods high in solid fats, added sugars, or salt.  Get regular exercise. This is one of the most important things you can do for your health. ? Most adults should exercise for at least 150 minutes each week. The exercise should increase your heart rate and make you sweat (moderate-intensity exercise). ? Most adults should also do strengthening exercises at least twice a week. This is in addition to the moderate-intensity exercise.  Maintain a healthy weight  Body mass index (BMI) is a measurement that can be used to identify possible weight problems. It estimates body fat based on height and weight. Your health care provider can help determine your BMI and help you achieve or maintain a healthy weight.  For females 47 years of age and older: ? A BMI below 18.5 is considered underweight. ? A BMI of 18.5 to 24.9 is normal. ? A BMI of 25 to 29.9 is considered overweight. ? A BMI of 30 and above is considered obese.  Watch levels of cholesterol and blood lipids  You should start having your blood tested for lipids and  cholesterol at 65 years of age, then have this test every 5 years.  You may need to have your cholesterol levels checked more often if: ? Your lipid or cholesterol levels are high. ? You are older than 65 years of age. ? You are at high risk for heart disease.  Cancer screening Lung Cancer  Lung cancer screening is recommended for adults 67-61 years old who are at high risk for lung cancer because of a history of smoking.  A yearly low-dose CT scan of the lungs is recommended for people who: ? Currently smoke. ? Have  quit within the past 15 years. ? Have at least a 30-pack-year history of smoking. A pack year is smoking an average of one pack of cigarettes a day for 1 year.  Yearly screening should continue until it has been 15 years since you quit.  Yearly screening should stop if you develop a health problem that would prevent you from having lung cancer treatment.  Breast Cancer  Practice breast self-awareness. This means understanding how your breasts normally appear and feel.  It also means doing regular breast self-exams. Let your health care provider know about any changes, no matter how small.  If you are in your 20s or 30s, you should have a clinical breast exam (CBE) by a health care provider every 1-3 years as part of a regular health exam.  If you are 69 or older, have a CBE every year. Also consider having a breast X-ray (mammogram) every year.  If you have a family history of breast cancer, talk to your health care provider about genetic screening.  If you are at high risk for breast cancer, talk to your health care provider about having an MRI and a mammogram every year.  Breast cancer gene (BRCA) assessment is recommended for women who have family members with BRCA-related cancers. BRCA-related cancers include: ? Breast. ? Ovarian. ? Tubal. ? Peritoneal cancers.  Results of the assessment will determine the need for genetic counseling and BRCA1 and BRCA2 testing.  Cervical Cancer Your health care provider may recommend that you be screened regularly for cancer of the pelvic organs (ovaries, uterus, and vagina). This screening involves a pelvic examination, including checking for microscopic changes to the surface of your cervix (Pap test). You may be encouraged to have this screening done every 3 years, beginning at age 72.  For women ages 73-65, health care providers may recommend pelvic exams and Pap testing every 3 years, or they may recommend the Pap and pelvic exam, combined  with testing for human papilloma virus (HPV), every 5 years. Some types of HPV increase your risk of cervical cancer. Testing for HPV may also be done on women of any age with unclear Pap test results.  Other health care providers may not recommend any screening for nonpregnant women who are considered low risk for pelvic cancer and who do not have symptoms. Ask your health care provider if a screening pelvic exam is right for you.  If you have had past treatment for cervical cancer or a condition that could lead to cancer, you need Pap tests and screening for cancer for at least 20 years after your treatment. If Pap tests have been discontinued, your risk factors (such as having a new sexual partner) need to be reassessed to determine if screening should resume. Some women have medical problems that increase the chance of getting cervical cancer. In these cases, your health care provider may recommend more frequent screening  and Pap tests.  Colorectal Cancer  This type of cancer can be detected and often prevented.  Routine colorectal cancer screening usually begins at 65 years of age and continues through 65 years of age.  Your health care provider may recommend screening at an earlier age if you have risk factors for colon cancer.  Your health care provider may also recommend using home test kits to check for hidden blood in the stool.  A small camera at the end of a tube can be used to examine your colon directly (sigmoidoscopy or colonoscopy). This is done to check for the earliest forms of colorectal cancer.  Routine screening usually begins at age 28.  Direct examination of the colon should be repeated every 5-10 years through 65 years of age. However, you may need to be screened more often if early forms of precancerous polyps or small growths are found.  Skin Cancer  Check your skin from head to toe regularly.  Tell your health care provider about any new moles or changes in moles,  especially if there is a change in a mole's shape or color.  Also tell your health care provider if you have a mole that is larger than the size of a pencil eraser.  Always use sunscreen. Apply sunscreen liberally and repeatedly throughout the day.  Protect yourself by wearing long sleeves, pants, a wide-brimmed hat, and sunglasses whenever you are outside.  Heart disease, diabetes, and high blood pressure  High blood pressure causes heart disease and increases the risk of stroke. High blood pressure is more likely to develop in: ? People who have blood pressure in the high end of the normal range (130-139/85-89 mm Hg). ? People who are overweight or obese. ? People who are African American.  If you are 57-48 years of age, have your blood pressure checked every 3-5 years. If you are 27 years of age or older, have your blood pressure checked every year. You should have your blood pressure measured twice-once when you are at a hospital or clinic, and once when you are not at a hospital or clinic. Record the average of the two measurements. To check your blood pressure when you are not at a hospital or clinic, you can use: ? An automated blood pressure machine at a pharmacy. ? A home blood pressure monitor.  If you are between 84 years and 43 years old, ask your health care provider if you should take aspirin to prevent strokes.  Have regular diabetes screenings. This involves taking a blood sample to check your fasting blood sugar level. ? If you are at a normal weight and have a low risk for diabetes, have this test once every three years after 65 years of age. ? If you are overweight and have a high risk for diabetes, consider being tested at a younger age or more often. Preventing infection Hepatitis B  If you have a higher risk for hepatitis B, you should be screened for this virus. You are considered at high risk for hepatitis B if: ? You were born in a country where hepatitis B is  common. Ask your health care provider which countries are considered high risk. ? Your parents were born in a high-risk country, and you have not been immunized against hepatitis B (hepatitis B vaccine). ? You have HIV or AIDS. ? You use needles to inject street drugs. ? You live with someone who has hepatitis B. ? You have had sex with someone who  has hepatitis B. ? You get hemodialysis treatment. ? You take certain medicines for conditions, including cancer, organ transplantation, and autoimmune conditions.  Hepatitis C  Blood testing is recommended for: ? Everyone born from [birth year] through 1963. ? Anyone with known risk factors for hepatitis C.  Sexually transmitted infections (STIs)  You should be screened for sexually transmitted infections (STIs) including gonorrhea and chlamydia if: ? You are sexually active and are younger than 65 years of age. ? You are older than 65 years of age and your health care provider tells you that you are at risk for this type of infection. ? Your sexual activity has changed since you were last screened and you are at an increased risk for chlamydia or gonorrhea. Ask your health care provider if you are at risk.  If you do not have HIV, but are at risk, it may be recommended that you take a prescription medicine daily to prevent HIV infection. This is called pre-exposure prophylaxis (PrEP). You are considered at risk if: ? You are sexually active and do not regularly use condoms or know the HIV status of your partner(s). ? You take drugs by injection. ? You are sexually active with a partner who has HIV.  Talk with your health care provider about whether you are at high risk of being infected with HIV. If you choose to begin PrEP, you should first be tested for HIV. You should then be tested every 3 months for as long as you are taking PrEP. Pregnancy  If you are premenopausal and you may become pregnant, ask your health care provider about preconception  counseling.  If you may become pregnant, take 400 to 800 micrograms (mcg) of folic acid every day.  If you want to prevent pregnancy, talk to your health care provider about birth control (contraception). Osteoporosis and menopause  Osteoporosis is a disease in which the bones lose minerals and strength with aging. This can result in serious bone fractures. Your risk for osteoporosis can be identified using a bone density scan.  If you are 38 years of age or older, or if you are at risk for osteoporosis and fractures, ask your health care provider if you should be screened.  Ask your health care provider whether you should take a calcium or vitamin D supplement to lower your risk for osteoporosis.  Menopause may have certain physical symptoms and risks.  Hormone replacement therapy may reduce some of these symptoms and risks. Talk to your health care provider about whether hormone replacement therapy is right for you. Follow these instructions at home:  Schedule regular health, dental, and eye exams.  Stay current with your immunizations.  Do not use any tobacco products including cigarettes, chewing tobacco, or electronic cigarettes.  If you are pregnant, do not drink alcohol.  If you are breastfeeding, limit how much and how often you drink alcohol.  Limit alcohol intake to no more than 1 drink per day for nonpregnant women. One drink equals 12 ounces of beer, 5 ounces of wine, or 1 ounces of hard liquor.  Do not use street drugs.  Do not share needles.  Ask your health care provider for help if you need support or information about quitting drugs.  Tell your health care provider if you often feel depressed.  Tell your health care provider if you have ever been abused or do not feel safe at home. This information is not intended to replace advice given to you by your health care  provider. Make sure you discuss any questions you have with your health care provider. Document  Released: 12/26/2010 Document Revised: 11/18/2015 Document Reviewed: 03/16/2015 Elsevier Interactive Patient Education  [employer].

## 2017-12-17 NOTE — Progress Notes (Signed)
Subjective:    Patient ID: Angie Garcia, female    DOB: 1950/08/04, 67 y.o.   MRN: [id number]  HPI  Here for medicare wellness exam and a physical exam.   I have personally reviewed and have noted 1.The patient's medical and social history 2.Their use of alcohol, tobacco or illicit drugs 3.Their current medications and supplements 4.The patient's functional ability including ADL's, fall risks, home                 safety risk and hearing or visual impairment. 5.Diet and physical activities 6.Evidence for depression or mood disorders 7.Care team reviewed  -  Eye doctor, derm  She retired one year ago and is enjoying retirement.  Overall she feels well and has no concerns.  Are there smokers in your home (other than you)? No  Risk Factors Exercise: walking, hiking and stationary bike Dietary issues discussed:   Eating more vegetables since retiring, limits sugars, eats mostly at home  Vitamin and supplement use:  MVI, tumeric, fruit/ vegetable vitamin  Opiod use:  None   Side effects from medication:  n/a Does medications benefits outweigh risks/side effects:   na  Cardiac risk factors: advanced age, hypertension  Depression Screen  Have you felt down, depressed or hopeless? No  Have you felt little interest or pleasure in doing things?  No  Activities of Daily Living In your present state of health, do you have any difficulty performing the following activities?:  Driving? No Managing money?  No Feeding yourself? No Getting from bed to chair? No Climbing a flight of stairs? No Preparing food and eating?: No Bathing or showering? No Getting dressed: No Getting to/using the toilet? No Moving around from place to place: No In the past year have you fallen or had a near fall?: tripped over hole in sidewalk - no injuries   Are you sexually active?  yes  Do you have more than one partner?  No   Hearing  Difficulties: No Do you often ask people to speak up or repeat themselves? No Do you experience ringing or noises in your ears? No Do you have difficulty understanding soft or whispered voices? No Vision:              Any change in vision:  no             Up to date with eye exam:  yes   Memory:  Do you feel that you have a problem with memory? No  Do you often misplace items? No  Do you feel safe at home?  Yes  Cognitive Testing  Alert, Orientated? Yes  Normal Appearance? Yes  Recall of three objects?  Yes  Can perform simple calculations? Yes  Displays appropriate judgment? Yes  Can read the correct time from a watch face? Yes   Advanced Directives have been discussed with the patient? Yes     Medications and allergies reviewed with patient and updated if appropriate.  Patient Active Problem List   Diagnosis Date Noted  . Hx of adenomatous polyp of colon 05/09/2017  . Cyst of left kidney 10/21/2015  . Prediabetes 08/19/2015  . Hypercalcemia 06/03/2014  . Hyperlipidemia 06/02/2014  . DJD (degenerative joint disease) of knee 04/01/2013  . Hemorrhoids, internal, with bleeding 07/04/2011  . GERD 04/07/2010  . LACTOSE INTOLERANCE 11/18/2009  . RHINITIS 11/18/2009  . History of herpes zoster 10/28/2008  . Essential hypertension 01/24/2008    Current Outpatient Medications on File Prior  to Visit  Medication Sig Dispense Refill  . albuterol (PROVENTIL HFA;VENTOLIN HFA) 108 (90 Base) MCG/ACT inhaler Inhale 2 puffs into the lungs every 4 (four) hours as needed for wheezing or shortness of breath. 1 Inhaler 0  . CARTIA XT 120 MG 24 hr capsule TAKE 1 CAPSULE BY MOUTH ONCE DAILY 90 capsule 1  . cetirizine (ZYRTEC) 10 MG tablet Take 1 tablet (10 mg total) by mouth daily. 30 tablet 0  . fluticasone (FLONASE) 50 MCG/ACT nasal spray Place 1 spray into both nostrils daily. 16 g 2  . lisinopril (PRINIVIL,ZESTRIL) 20 MG tablet TAKE 1 TABLET BY MOUTH ONCE DAILY 90 tablet 1  . Multiple  Vitamin (MULTIVITAMIN) capsule Take 1 capsule by mouth daily.    Marland Kitchen OVER THE COUNTER MEDICATION Tumeric one capsule a day.     No current facility-administered medications on file prior to visit.     Past Medical History:  Diagnosis Date  . Allergy   . Arthritis   . Baker's cyst    Left knee  . Hx of adenomatous polyp of colon 05/09/2017  . Hypertension   . Lactose intolerance   . Shingles 10/2008   RUE    Past Surgical History:  Procedure Laterality Date  . ABDOMINAL HYSTERECTOMY     Dr Connye Burkitt for fibroids  . COLONOSCOPY  12/11/2005   lymphoid aggregate removed (suspected polyp was not), melanosis coli  . PILONIDAL CYSTECTOMY      Social History   Socioeconomic History  . Marital status: Married    Spouse name: Darnell Level  . Number of children: 1  . Years of education: Not on file  . Highest education level: Not on file  Occupational History  . Occupation: retired    Fish farm manager: [place]  . Financial resource strain: Not on file  . Food insecurity:    Worry: Not on file    Inability: Not on file  . Transportation needs:    Medical: Not on file    Non-medical: Not on file  Tobacco Use  . Smoking status: Former Smoker    Last attempt to quit: 06/27/1995    Years since quitting: 22.4  . Smokeless tobacco: Never Used  . Tobacco comment: smoked [age], up to < 1 ppd  Substance and Sexual Activity  . Alcohol use: No  . Drug use: No  . Sexual activity: Not on file  Lifestyle  . Physical activity:    Days per week: Not on file    Minutes per session: Not on file  . Stress: Not on file  Relationships  . Social connections:    Talks on phone: Not on file    Gets together: Not on file    Attends religious service: Not on file    Active member of club or organization: Not on file    Attends meetings of clubs or organizations: Not on file    Relationship status: Not on file  Other Topics Concern  . Not on file  Social History Narrative   REGULAR  EXERCISE    Family History  Problem Relation Age of Onset  . Hypertension Mother   . Breast cancer Mother   . Diabetes Father   . Stroke Father        MINI STROKES  . Stroke Sister 35  . Diabetes Sister   . Cancer Maternal Uncle        UNKNOWN TYPE  . Colon cancer Neg Hx   . Esophageal cancer Neg  Hx   . Pancreatic cancer Neg Hx   . Rectal cancer Neg Hx   . Stomach cancer Neg Hx     Review of Systems  Constitutional: Negative for chills, fatigue and fever.  HENT: Negative for hearing loss and tinnitus.   Eyes: Negative for visual disturbance.  Respiratory: Negative for cough, shortness of breath and wheezing.   Cardiovascular: Negative for chest pain, palpitations and leg swelling.  Gastrointestinal: Negative for abdominal pain, blood in stool, constipation, diarrhea and nausea.       No gerd  Genitourinary: Negative for dysuria and hematuria.  Musculoskeletal: Positive for arthralgias (knee). Negative for back pain.  Skin: Negative for color change and rash.       Has melasma  Neurological: Negative for light-headedness and headaches.  Psychiatric/Behavioral: Negative for dysphoric mood and sleep disturbance. The patient is not nervous/anxious.        Objective:   Vitals:   12/18/17 1111  BP: 104/62  Pulse: 69  Resp: 16  Temp: 98.5 F (36.9 C)  SpO2: 98%   Filed Weights   12/18/17 1111  Weight: 171 lb (77.6 kg)   Body mass index is 28.46 kg/m.  Wt Readings from Last 3 Encounters:  12/18/17 171 lb (77.6 kg)  05/03/17 183 lb (83 kg)  04/19/17 183 lb 6.4 oz (83.2 kg)     Visual Acuity Screening   Right eye Left eye Both eyes  Without correction:     With correction: 20/13 20/15 20/13       Physical Exam Constitutional: She appears well-developed and well-nourished. No distress.  HENT:  Head: Normocephalic and atraumatic.  Right Ear: External ear normal. Normal ear canal and TM Left Ear: External ear normal.  Normal ear canal and TM Mouth/Throat:  Oropharynx is clear and moist.  Eyes: Conjunctivae and EOM are normal.  Neck: Neck supple. No tracheal deviation present. No thyromegaly present.  No carotid bruit  Cardiovascular: Normal rate, regular rhythm and normal heart sounds.   No murmur heard.  No edema. Pulmonary/Chest: Effort normal and breath sounds normal. No respiratory distress. She has no wheezes. She has no rales.  Breast: deferred  Abdominal: Soft. She exhibits no distension. There is no tenderness.  Lymphadenopathy: She has no cervical adenopathy.  Skin: Skin is warm and dry. She is not diaphoretic.  Psychiatric: She has a normal mood and affect. Her behavior is normal.        Assessment & Plan:   Wellness Exam: Immunizations   Discussed shingrix,  prevnar discussed - deferred Colonoscopy   Up to date  Mammogram     Up to date  Gyn  No longer seeing gyn Dexa-never had one.  She will schedule when she schedules her mammogram later this year EKG-discussed getting an EKG with her-she deferred and clinically not necessary so we will hold off Eye exam   up-to-date Hearing loss    no hearing difficulty or tinnitus Memory concerns/difficulties   no memory concerns.  Discussed prevention of memory loss Independent of ADLs    fully independent Stressed the importance of regular exercise   Patient received copy of preventative screening tests/immunizations recommended for the next 5-10 years.    Physical exam: Screening blood work  ordered Immunizations   Discussed shingrix,  prevnar discussed - deferred Colonoscopy   Up to date  Mammogram     Up to date  Gyn  No longer seeing gyn Dexa-never had one.  She will schedule when she schedules her mammogram later  this year Eye exams   up-to-date EKG   Done  2014, given no concerning symptoms no EKG necessary Exercise   Walking and Hiking, stationary bike Weight   has lost weight over the past year and working on losing more Skin   no concerns Substance abuse    none  See Problem List for Assessment and Plan of chronic medical problems.    Follow-up annually

## 2017-12-18 ENCOUNTER — Encounter: Payer: Self-pay | Admitting: Internal Medicine

## 2017-12-18 ENCOUNTER — Other Ambulatory Visit (INDEPENDENT_AMBULATORY_CARE_PROVIDER_SITE_OTHER): Payer: Medicare HMO

## 2017-12-18 ENCOUNTER — Other Ambulatory Visit: Payer: Medicare HMO

## 2017-12-18 ENCOUNTER — Ambulatory Visit (INDEPENDENT_AMBULATORY_CARE_PROVIDER_SITE_OTHER): Payer: Medicare HMO | Admitting: Internal Medicine

## 2017-12-18 VITALS — BP 104/62 | HR 69 | Temp 98.5°F | Resp 16 | Ht 65.0 in | Wt 171.0 lb

## 2017-12-18 DIAGNOSIS — Z0000 Encounter for general adult medical examination without abnormal findings: Secondary | ICD-10-CM | POA: Diagnosis not present

## 2017-12-18 DIAGNOSIS — E782 Mixed hyperlipidemia: Secondary | ICD-10-CM

## 2017-12-18 DIAGNOSIS — I10 Essential (primary) hypertension: Secondary | ICD-10-CM

## 2017-12-18 DIAGNOSIS — R7303 Prediabetes: Secondary | ICD-10-CM | POA: Diagnosis not present

## 2017-12-18 LAB — CBC WITH DIFFERENTIAL/PLATELET
BASOS ABS: 0.1 10*3/uL (ref 0.0–0.1)
Basophils Relative: 1 % (ref 0.0–3.0)
Eosinophils Absolute: 0.1 10*3/uL (ref 0.0–0.7)
Eosinophils Relative: 1.8 % (ref 0.0–5.0)
HEMATOCRIT: 40.1 % (ref 36.0–46.0)
Hemoglobin: 13.3 g/dL (ref 12.0–15.0)
LYMPHS PCT: 36.3 % (ref 12.0–46.0)
Lymphs Abs: 2.1 10*3/uL (ref 0.7–4.0)
MCHC: 33.1 g/dL (ref 30.0–36.0)
MCV: 85.3 fl (ref 78.0–100.0)
MONOS PCT: 7.2 % (ref 3.0–12.0)
Monocytes Absolute: 0.4 10*3/uL (ref 0.1–1.0)
NEUTROS ABS: 3.1 10*3/uL (ref 1.4–7.7)
Neutrophils Relative %: 53.7 % (ref 43.0–77.0)
PLATELETS: 370 10*3/uL (ref 150.0–400.0)
RBC: 4.7 Mil/uL (ref 3.87–5.11)
RDW: 13.1 % (ref 11.5–15.5)
WBC: 5.7 10*3/uL (ref 4.0–10.5)

## 2017-12-18 LAB — COMPREHENSIVE METABOLIC PANEL
ALT: 15 U/L (ref 0–35)
AST: 17 U/L (ref 0–37)
Albumin: 4.2 g/dL (ref 3.5–5.2)
Alkaline Phosphatase: 77 U/L (ref 39–117)
BILIRUBIN TOTAL: 0.3 mg/dL (ref 0.2–1.2)
BUN: 18 mg/dL (ref 6–23)
CALCIUM: 11 mg/dL — AB (ref 8.4–10.5)
CHLORIDE: 108 meq/L (ref 96–112)
CO2: 27 meq/L (ref 19–32)
Creatinine, Ser: 0.77 mg/dL (ref 0.40–1.20)
GFR: 96.75 mL/min (ref >60.00)
Glucose, Bld: 94 mg/dL (ref 70–99)
POTASSIUM: 4.2 meq/L (ref 3.5–5.1)
Sodium: 141 mEq/L (ref 135–145)
Total Protein: 7.2 g/dL (ref 6.0–8.3)

## 2017-12-18 LAB — LIPID PANEL
Cholesterol: 247 mg/dL — ABNORMAL HIGH (ref 0–200)
HDL: 69.5 mg/dL (ref >39.00)
LDL CALC: 156 mg/dL — AB (ref 0–99)
NONHDL: 177.42
Total CHOL/HDL Ratio: 4
Triglycerides: 107 mg/dL (ref 0.0–149.0)
VLDL: 21.4 mg/dL (ref 0.0–40.0)

## 2017-12-18 LAB — VITAMIN D 25 HYDROXY (VIT D DEFICIENCY, FRACTURES): VITD: 34.89 ng/mL (ref 30.00–100.00)

## 2017-12-18 LAB — TSH: TSH: 0.86 u[IU]/mL (ref 0.35–4.50)

## 2017-12-18 MED ORDER — LISINOPRIL 20 MG PO TABS: 20.0000 mg | ORAL_TABLET | Freq: Every day | ORAL | 3 refills | Status: DC

## 2017-12-18 MED ORDER — DILTIAZEM HCL ER COATED BEADS 120 MG PO CP24: 120.0000 mg | ORAL_CAPSULE | Freq: Every day | ORAL | 3 refills | Status: DC

## 2017-12-18 NOTE — Assessment & Plan Note (Signed)
Check a1c Low sugar / carb diet Stressed regular exercise, working on further weight loss

## 2017-12-18 NOTE — Assessment & Plan Note (Signed)
History of elevated calcium, with minimally elevated PTH Check CMP, intact PTH vitamin D level

## 2017-12-18 NOTE — Assessment & Plan Note (Signed)
Check lipid panel  Not on medication-diet controlled Regular exercise and healthy diet encouraged

## 2017-12-18 NOTE — Assessment & Plan Note (Signed)
BP well controlled Current regimen effective and well tolerated Continue current medications at current doses cmp  

## 2017-12-19 LAB — HEMOGLOBIN A1C: HEMOGLOBIN A1C: 5.9 % (ref 4.6–6.5)

## 2017-12-20 ENCOUNTER — Encounter: Payer: Self-pay | Admitting: Internal Medicine

## 2017-12-20 LAB — PTH, INTACT AND CALCIUM
CALCIUM: 11 mg/dL — AB (ref 8.6–10.4)
PTH: 94 pg/mL — AB (ref 14–64)

## 2017-12-21 NOTE — Telephone Encounter (Signed)
Pt would like the referral put in for Endo.

## 2017-12-22 ENCOUNTER — Other Ambulatory Visit: Payer: Self-pay | Admitting: Internal Medicine

## 2018-02-14 MED FILL — CARTIA XT 120 MG CAPSULE SA: 120 | 30 days supply | Qty: 30 | Fill #0

## 2018-02-14 MED FILL — LISINOPRIL 20 MG TABLET: 20 | 30 days supply | Qty: 30 | Fill #0

## 2018-03-14 ENCOUNTER — Ambulatory Visit (INDEPENDENT_AMBULATORY_CARE_PROVIDER_SITE_OTHER): Payer: Medicare HMO | Admitting: Endocrinology

## 2018-03-14 ENCOUNTER — Encounter: Payer: Self-pay | Admitting: Endocrinology

## 2018-03-14 DIAGNOSIS — E042 Nontoxic multinodular goiter: Secondary | ICD-10-CM | POA: Diagnosis not present

## 2018-03-14 DIAGNOSIS — E210 Primary hyperparathyroidism: Secondary | ICD-10-CM | POA: Diagnosis not present

## 2018-03-14 NOTE — Progress Notes (Signed)
Subjective:    Patient ID: Angie Garcia, female    DOB: 16-Jul-1950, 67 y.o.   MRN: [id number]  HPI Pt is referred by Dr Quay Burow, for hypercalcemia.  Pt was noted to have moderate hypercalcemia in 2015 (it was normal in 2014).  she has never had osteoporosis, urolithiasis, thyroid probs, parathyroid probs, sarcoidosis, cancer, PUD, pancreatitis, depression, or bony fracture.  she does not take vitamin-D or A supplements.  Pt denies taking antacids, Li++, or HCTZ.  She has slight pain at the lower back, but no assoc numbness of the feet.  Past Medical History:  Diagnosis Date  . Allergy   . Arthritis   . Baker's cyst    Left knee  . Hx of adenomatous polyp of colon 05/09/2017  . Hypertension   . Lactose intolerance   . Shingles 10/2008   RUE    Past Surgical History:  Procedure Laterality Date  . ABDOMINAL HYSTERECTOMY     Dr Connye Burkitt for fibroids  . COLONOSCOPY  12/11/2005   lymphoid aggregate removed (suspected polyp was not), melanosis coli  . PILONIDAL CYSTECTOMY      Social History   Socioeconomic History  . Marital status: Married    Spouse name: Darnell Level  . Number of children: 1  . Years of education: Not on file  . Highest education level: Not on file  Occupational History  . Occupation: retired    Fish farm manager: [place]  . Financial resource strain: Not on file  . Food insecurity:    Worry: Not on file    Inability: Not on file  . Transportation needs:    Medical: Not on file    Non-medical: Not on file  Tobacco Use  . Smoking status: Former Smoker    Last attempt to quit: 06/27/1995    Years since quitting: 22.7  . Smokeless tobacco: Never Used  . Tobacco comment: smoked [age], up to < 1 ppd  Substance and Sexual Activity  . Alcohol use: No  . Drug use: No  . Sexual activity: Yes  Lifestyle  . Physical activity:    Days per week: Not on file    Minutes per session: Not on file  . Stress: Not on file  Relationships  . Social connections:      Talks on phone: Not on file    Gets together: Not on file    Attends religious service: Not on file    Active member of club or organization: Not on file    Attends meetings of clubs or organizations: Not on file    Relationship status: Not on file  . Intimate partner violence:    Fear of current or ex partner: Not on file    Emotionally abused: Not on file    Physically abused: Not on file    Forced sexual activity: Not on file  Other Topics Concern  . Not on file  Social History Narrative   REGULAR EXERCISE    Current Outpatient Medications on File Prior to Visit  Medication Sig Dispense Refill  . albuterol (PROVENTIL HFA;VENTOLIN HFA) 108 (90 Base) MCG/ACT inhaler Inhale 2 puffs into the lungs every 4 (four) hours as needed for wheezing or shortness of breath. 1 Inhaler 0  . cetirizine (ZYRTEC) 10 MG tablet Take 1 tablet (10 mg total) by mouth daily. 30 tablet 0  . diltiazem (CARTIA XT) 120 MG 24 hr capsule Take 1 capsule (120 mg total) by mouth daily. 90 capsule 3  .  fluticasone (FLONASE) 50 MCG/ACT nasal spray Place 1 spray into both nostrils daily. 16 g 2  . lisinopril (PRINIVIL,ZESTRIL) 20 MG tablet Take 1 tablet (20 mg total) by mouth daily. 90 tablet 3  . Multiple Vitamin (MULTIVITAMIN) capsule Take 1 capsule by mouth daily.    Marland Kitchen OVER THE COUNTER MEDICATION Tumeric one capsule a day.     No current facility-administered medications on file prior to visit.     Allergies  Allergen Reactions  . Sulfamethoxazole-Trimethoprim     Rash Because of a history of documented adverse serious drug reaction;Medi Alert bracelet  is recommended    Family History  Problem Relation Age of Onset  . Hypertension Mother   . Breast cancer Mother   . Diabetes Father   . Stroke Father        MINI STROKES  . Stroke Sister 58  . Diabetes Sister   . Cancer Maternal Uncle        UNKNOWN TYPE  . Colon cancer Neg Hx   . Esophageal cancer Neg Hx   . Pancreatic cancer Neg Hx   .  Rectal cancer Neg Hx   . Stomach cancer Neg Hx   . Hyperparathyroidism Neg Hx     BP 122/72   Pulse 62   Ht 5\' 5"  (1.651 m)   Wt 172 lb 3.2 oz (78.1 kg)   SpO2 98%   BMI 28.66 kg/m     Review of Systems Denies visual loss, cough, edema, diarrhea, sore throat, polyuria, hematuria, rash, depression, tremor, and back pain.  She has lost a few lbs, due to her efforts.      Objective:   Physical Exam VS: see vs page GEN: no distress HEAD: head: no deformity eyes: no periorbital swelling, no proptosis external nose and ears are normal mouth: no lesion seen NECK: left and right thyroid nodules are noted (each approx 2 cm) CHEST WALL: no deformity.  No kyphosis LUNGS: clear to auscultation CV: reg rate and rhythm, no murmur ABD: abdomen is soft, nontender.  no hepatosplenomegaly.  not distended.  no hernia MUSCULOSKELETAL: muscle bulk and strength are grossly normal.  no obvious joint swelling.  gait is normal and steady EXTEMITIES: no deformity.  no edema PULSES: no carotid bruit NEURO:  cn 2-12 grossly intact.   readily moves all 4's.  sensation is intact to touch on all 4's SKIN:  Normal texture and temperature.  No rash or suspicious lesion is visible.   NODES:  None palpable at the neck PSYCH: alert, well-oriented.  Does not appear anxious nor depressed.     Lab Results  Component Value Date   PTH 94 (H) 12/18/2017   CALCIUM 11.0 (H) 12/18/2017   CALCIUM 11.0 (H) 12/18/2017   CAION 1.27 05/22/2011   Lab Results  Component Value Date   TSH 0.86 12/18/2017   I have reviewed outside records, and summarized: Pt was noted to have hypercalcemia, and referred here.  Welcome to medicare visit was done, and other probs were stable.        Assessment & Plan:  Hypercalcemia, new, prob due to primary hyperparathyroidism.   Patient Instructions  Let's check a bone density test.  If this is fine, the parathyroid can calcium need no treatment now--we would recheck this in  the future.   Let's also check the thyroid ultrasound (not related to the parathyroid or calcium).  you will receive a phone call, about a day and time for an appointment.   It  may say you need a biopsy.  If so, we can do it here.  It is easy.   Please come back for a follow-up appointment in 6 months.

## 2018-03-14 NOTE — Patient Instructions (Addendum)
Let's check a bone density test.  If this is fine, the parathyroid can calcium need no treatment now--we would recheck this in the future.   Let's also check the thyroid ultrasound (not related to the parathyroid or calcium).  you will receive a phone call, about a day and time for an appointment.   It may say you need a biopsy.  If so, we can do it here.  It is easy.   Please come back for a follow-up appointment in 6 months.

## 2018-03-18 ENCOUNTER — Ambulatory Visit (INDEPENDENT_AMBULATORY_CARE_PROVIDER_SITE_OTHER)
Admission: RE | Admit: 2018-03-18 | Discharge: 2018-03-18 | Disposition: A | Discharge status: Home / Self Care | Payer: Medicare HMO | Source: Ambulatory Visit | Attending: Endocrinology | Admitting: Endocrinology

## 2018-03-18 DIAGNOSIS — E210 Primary hyperparathyroidism: Secondary | ICD-10-CM | POA: Diagnosis not present

## 2018-03-22 ENCOUNTER — Ambulatory Visit
Admission: RE | Admit: 2018-03-22 | Discharge: 2018-03-22 | Disposition: A | Discharge status: Home / Self Care | Payer: Medicare HMO | Source: Ambulatory Visit | Attending: Endocrinology | Admitting: Endocrinology

## 2018-03-22 DIAGNOSIS — E041 Nontoxic single thyroid nodule: Secondary | ICD-10-CM | POA: Diagnosis not present

## 2018-03-22 DIAGNOSIS — E042 Nontoxic multinodular goiter: Secondary | ICD-10-CM

## 2018-03-25 ENCOUNTER — Telehealth: Payer: Self-pay | Admitting: Endocrinology

## 2018-03-25 NOTE — Telephone Encounter (Signed)
Please advise 

## 2018-03-25 NOTE — Telephone Encounter (Signed)
Pt states that scans came back clear and Calcium was high the last time pt sow Lo[facility] Drilling. States they have not gotten answers. Please advise pt. Ph# [phone]

## 2018-03-25 NOTE — Telephone Encounter (Signed)
The calcium is slightly high, because of the overactive parathyroid.  No treatment is needed now--good.  Please come back for a follow-up appointment in 6 months

## 2018-03-26 NOTE — Telephone Encounter (Signed)
Pt informed

## 2018-04-02 DIAGNOSIS — R69 Illness, unspecified: Secondary | ICD-10-CM | POA: Diagnosis not present

## 2018-04-04 MED FILL — CARTIA XT 120 MG CP24: 120 | 30 days supply | Qty: 30 | Fill #1

## 2018-04-04 MED FILL — LISINOPRIL 20 MG TABLET: 20 | 30 days supply | Qty: 30 | Fill #1 | Status: TO

## 2018-04-08 ENCOUNTER — Telehealth: Payer: Self-pay | Admitting: Internal Medicine

## 2018-04-08 MED ORDER — LISINOPRIL 20 MG PO TABS: 20.0000 mg | ORAL_TABLET | Freq: Every day | ORAL | 1 refills | Status: DC

## 2018-04-08 MED ORDER — DILTIAZEM HCL ER COATED BEADS 120 MG PO CP24: 120.0000 mg | ORAL_CAPSULE | Freq: Every day | ORAL | 1 refills | Status: DC

## 2018-04-08 NOTE — Telephone Encounter (Signed)
Reviewed chart pt is up-to-date sent refills to [facility].../lmb  

## 2018-04-08 NOTE — Telephone Encounter (Signed)
Copied from [place] [id number]. Topic: Quick Communication - Rx Refill/Question >> Apr 08, 2018  8:09 AM Oliver Pila B wrote: Pt called b/c she is wanting to have the medications below transferred to a different pharmacy; pt states she was not receiving the 90 day supply she was asking pharmacist for a quantity of 30 due to medications price; pt is looking for the medication to transfer to [facility] [address]; Rx will be cheaper there and pt is asking for the 90 count; contact to advise   Medication: lisinopril (PRINIVIL,ZESTRIL) 20 MG tablet [[id number]]  diltiazem (CARTIA XT) 120 MG 24 hr capsule [[id number]]   Has the patient contacted their pharmacy? Yes.   (Agent: If no, request that the patient contact the pharmacy for the refill.) (Agent: If yes, when and what did the pharmacy advise?)  Preferred Pharmacy (with phone number or street name): [facility] [address]   Agent: Please be advised that RX refills may take up to 3 business days. We ask that you follow-up with your pharmacy.

## 2018-04-11 ENCOUNTER — Other Ambulatory Visit: Payer: Self-pay | Admitting: Internal Medicine

## 2018-04-11 DIAGNOSIS — Z1231 Encounter for screening mammogram for malignant neoplasm of breast: Secondary | ICD-10-CM

## 2018-05-07 ENCOUNTER — Encounter: Payer: Self-pay | Admitting: Internal Medicine

## 2018-05-07 NOTE — Telephone Encounter (Signed)
Documented flu shot.../LMB  

## 2018-05-27 ENCOUNTER — Ambulatory Visit (HOSPITAL_BASED_OUTPATIENT_CLINIC_OR_DEPARTMENT_OTHER)
Admission: RE | Admit: 2018-05-27 | Discharge: 2018-05-27 | Disposition: A | Discharge status: Home / Self Care | Payer: Medicare HMO | Source: Ambulatory Visit | Attending: Internal Medicine | Admitting: Internal Medicine

## 2018-05-27 DIAGNOSIS — Z1231 Encounter for screening mammogram for malignant neoplasm of breast: Secondary | ICD-10-CM

## 2018-06-18 ENCOUNTER — Encounter: Payer: Self-pay | Admitting: Family

## 2018-06-18 ENCOUNTER — Other Ambulatory Visit (INDEPENDENT_AMBULATORY_CARE_PROVIDER_SITE_OTHER): Payer: Medicare HMO

## 2018-06-18 ENCOUNTER — Ambulatory Visit (INDEPENDENT_AMBULATORY_CARE_PROVIDER_SITE_OTHER): Payer: Medicare HMO | Admitting: Family

## 2018-06-18 VITALS — BP 118/72 | HR 94 | Temp 97.9°F | Ht 65.0 in | Wt 170.1 lb

## 2018-06-18 DIAGNOSIS — K625 Hemorrhage of anus and rectum: Secondary | ICD-10-CM | POA: Diagnosis not present

## 2018-06-18 LAB — CBC WITH DIFFERENTIAL/PLATELET
BASOS PCT: 0.3 % (ref 0.0–3.0)
Basophils Absolute: 0 10*3/uL (ref 0.0–0.1)
EOS ABS: 0.1 10*3/uL (ref 0.0–0.7)
Eosinophils Relative: 0.6 % (ref 0.0–5.0)
HCT: 42.3 % (ref 36.0–46.0)
HEMOGLOBIN: 13.6 g/dL (ref 12.0–15.0)
Lymphocytes Relative: 13.7 % (ref 12.0–46.0)
Lymphs Abs: 1.6 10*3/uL (ref 0.7–4.0)
MCHC: 32.2 g/dL (ref 30.0–36.0)
MCV: 84.9 fl (ref 78.0–100.0)
Monocytes Absolute: 0.9 10*3/uL (ref 0.1–1.0)
Monocytes Relative: 7.7 % (ref 3.0–12.0)
Neutro Abs: 9.2 10*3/uL — ABNORMAL HIGH (ref 1.4–7.7)
Neutrophils Relative %: 77.7 % — ABNORMAL HIGH (ref 43.0–77.0)
Platelets: 339 10*3/uL (ref 150.0–400.0)
RBC: 4.98 Mil/uL (ref 3.87–5.11)
RDW: 13.1 % (ref 11.5–15.5)
WBC: 11.9 10*3/uL — AB (ref 4.0–10.5)

## 2018-06-18 LAB — COMPREHENSIVE METABOLIC PANEL
ALBUMIN: 3.9 g/dL (ref 3.5–5.2)
ALK PHOS: 67 U/L (ref 39–117)
ALT: 54 U/L — AB (ref 0–35)
AST: 32 U/L (ref 0–37)
BUN: 11 mg/dL (ref 6–23)
CO2: 27 mEq/L (ref 19–32)
CREATININE: 0.85 mg/dL (ref 0.40–1.20)
Calcium: 11.1 mg/dL — ABNORMAL HIGH (ref 8.4–10.5)
Chloride: 106 mEq/L (ref 96–112)
GFR: 86.19 mL/min (ref >60.00)
Glucose, Bld: 121 mg/dL — ABNORMAL HIGH (ref 70–99)
Potassium: 3.6 mEq/L (ref 3.5–5.1)
SODIUM: 140 meq/L (ref 135–145)
TOTAL PROTEIN: 6.8 g/dL (ref 6.0–8.3)
Total Bilirubin: 0.3 mg/dL (ref 0.2–1.2)

## 2018-06-18 MED ORDER — HYDROCORTISONE ACETATE 25 MG RE SUPP: 25.0000 mg | Freq: Two times a day (BID) | RECTAL | 0 refills | Status: DC

## 2018-06-18 NOTE — Progress Notes (Signed)
Angie Garcia is a 67 y.o. female with the following history as recorded in Epic[employer]:  Patient Active Problem List   Diagnosis Date Noted  . Hyperparathyroidism, primary ([place]) 03/14/2018  . Multinodular goiter 03/14/2018  . Hx of adenomatous polyp of colon 05/09/2017  . Cyst of left kidney 10/21/2015  . Prediabetes 08/19/2015  . Hypercalcemia 06/03/2014  . Hyperlipidemia 06/02/2014  . DJD (degenerative joint disease) of knee 04/01/2013  . Hemorrhoids, internal, with bleeding 07/04/2011  . LACTOSE INTOLERANCE 11/18/2009  . RHINITIS 11/18/2009  . History of herpes zoster 10/28/2008  . Essential hypertension 01/24/2008    Current Outpatient Medications  Medication Sig Dispense Refill  . albuterol (PROVENTIL HFA;VENTOLIN HFA) 108 (90 Base) MCG/ACT inhaler Inhale 2 puffs into the lungs every 4 (four) hours as needed for wheezing or shortness of breath. 1 Inhaler 0  . cetirizine (ZYRTEC) 10 MG tablet Take 1 tablet (10 mg total) by mouth daily. 30 tablet 0  . diltiazem (CARTIA XT) 120 MG 24 hr capsule Take 1 capsule (120 mg total) by mouth daily. 90 capsule 1  . fluticasone (FLONASE) 50 MCG/ACT nasal spray Place 1 spray into both nostrils daily. 16 g 2  . lisinopril (PRINIVIL,ZESTRIL) 20 MG tablet Take 1 tablet (20 mg total) by mouth daily. 90 tablet 1  . Multiple Vitamin (MULTIVITAMIN) capsule Take 1 capsule by mouth daily.    Marland Kitchen OVER THE COUNTER MEDICATION Tumeric one capsule a day.    . hydrocortisone (ANUSOL-HC) 25 MG suppository Place 1 suppository (25 mg total) rectally 2 (two) times daily. 12 suppository 0   No current facility-administered medications for this visit.     Allergies: Sulfamethoxazole-trimethoprim  Past Medical History:  Diagnosis Date  . Allergy   . Arthritis   . Baker's cyst    Left knee  . Hx of adenomatous polyp of colon 05/09/2017  . Hypertension   . Lactose intolerance   . Shingles 10/2008   RUE    Past Surgical History:  Procedure Laterality Date  .  ABDOMINAL HYSTERECTOMY     Dr Connye Burkitt for fibroids  . COLONOSCOPY  12/11/2005   lymphoid aggregate removed (suspected polyp was not), melanosis coli  . PILONIDAL CYSTECTOMY      Family History  Problem Relation Age of Onset  . Hypertension Mother   . Breast cancer Mother   . Diabetes Father   . Stroke Father        MINI STROKES  . Stroke Sister 32  . Diabetes Sister   . Cancer Maternal Uncle        UNKNOWN TYPE  . Colon cancer Neg Hx   . Esophageal cancer Neg Hx   . Pancreatic cancer Neg Hx   . Rectal cancer Neg Hx   . Stomach cancer Neg Hx   . Hyperparathyroidism Neg Hx     Social History   Tobacco Use  . Smoking status: Former Smoker    Last attempt to quit: 06/27/1995    Years since quitting: 22.9  . Smokeless tobacco: Never Used  . Tobacco comment: smoked 16-19, up to < 1 ppd  Substance Use Topics  . Alcohol use: No    Subjective:  Patient woke up Sunday morning with abdominal pain; used OTC laxative to help with constipation- admits she was straining to have the bowel movement; began to notice rectal bleeding on Sunday morning- describes as alternating between bright red blood and "dark blood"; symptoms seemed to improve yesterday- has not had continued abdominal pain; however,  notice rectal bleeding again- did not occur with bowel movement- just started bleeding with applying pressure to the rectal area; does have history of hemorrhoids; Normal abdominal ultraosound in 2017/ normal abdominal MRI in 2017;   Objective:  Vitals:   06/18/18 1047  BP: 118/72  Pulse: 94  Temp: 97.9 F (36.6 C)  TempSrc: Oral  SpO2: 98%  Weight: 170 lb 1.9 oz (77.2 kg)  Height: _0  (1.651 m)    General: Well developed, well nourished, in no acute distress  Skin : Warm and dry.  Head: Normocephalic and atraumatic  Eyes: Sclera and conjunctiva clear; pupils round and reactive to light; extraocular movements intact  Ears: External normal; canals clear; tympanic membranes normal   Oropharynx: Pink, supple. No suspicious lesions  Neck: Supple without thyromegaly, adenopathy  Lungs: Respirations unlabored; clear to auscultation bilaterally without wheeze, rales, rhonchi  CVS exam: normal rate and regular rhythm.  Abdomen: Soft; nontender; nondistended; normoactive bowel sounds; no masses or hepatosplenomegaly  Neurologic: Alert and oriented; speech intact; face symmetrical; moves all extremities well; CNII-XII intact without focal deficit   Assessment:  1. Rectal bleeding     Plan:  Suspect hemorrhoid due to recent constipation/ straining; do not feel imaging is warranted today/ physical exam is reassuring; STAT CBC, CMP updated today- results were reassuring; refer to GI for further evaluation; follow-up worse, no better- strict ER precautions for upcoming holiday.  No follow-ups on file.  Orders Placed This Encounter  Procedures  . CBC w/Diff    Standing Status:   Future    Number of Occurrences:   1    Standing Expiration Date:   06/18/2019  . Comp Met (CMET)    Standing Status:   Future    Number of Occurrences:   1    Standing Expiration Date:   06/18/2019  . Ambulatory referral to Gastroenterology    Referral Priority:   Routine    Referral Type:   Consultation    Referral Reason:   Specialty Services Required    Referred to Provider:   Gatha Mayer, MD    Number of Visits Requested:   1    Requested Prescriptions   Signed Prescriptions Disp Refills  . hydrocortisone (ANUSOL-HC) 25 MG suppository 12 suppository 0    Sig: Place 1 suppository (25 mg total) rectally 2 (two) times daily.

## 2018-08-27 ENCOUNTER — Encounter: Payer: Self-pay | Admitting: Family

## 2018-09-12 ENCOUNTER — Ambulatory Visit: Payer: Medicare HMO | Admitting: Endocrinology

## 2018-10-10 ENCOUNTER — Other Ambulatory Visit: Payer: Self-pay | Admitting: Internal Medicine

## 2019-01-04 ENCOUNTER — Other Ambulatory Visit: Payer: Self-pay | Admitting: Internal Medicine

## 2019-01-30 DIAGNOSIS — H1033 Unspecified acute conjunctivitis, bilateral: Secondary | ICD-10-CM | POA: Diagnosis not present

## 2019-02-06 DIAGNOSIS — H1033 Unspecified acute conjunctivitis, bilateral: Secondary | ICD-10-CM | POA: Diagnosis not present

## 2019-02-13 NOTE — Progress Notes (Addendum)
Subjective:   Angie Garcia is a 68 y.o. female who presents for Medicare Annual (Subsequent) preventive examination. I connected with patient by a telephone and verified that I am speaking with the correct person using two identifiers. Patient stated full name and DOB. Patient gave permission to continue with telephonic visit. Patient's location was at home and Nurse's location was at [place] office.   Review of Systems:   Cardiac Risk Factors include: advanced age (>28men, >84 women);hypertension;dyslipidemia Sleep patterns: feels rested on waking and sleeps 8 hours nightly.    Home Safety/Smoke Alarms: Feels safe in home. Smoke alarms in place.  Living environment; residence and Firearm Safety: 2-story house, no firearms, firearms stored safely. Lives with husband, no needs for DME, good support system Seat Belt Safety/Bike Helmet: Wears seat belt.      Objective:     Vitals: There were no vitals taken for this visit.  There is no height or weight on file to calculate BMI.  Advanced Directives 02/14/2019 04/19/2017 09/19/2016  Does Patient Have a Medical Advance Directive? No No No  Does patient want to make changes to medical advance directive? Yes (ED - Information included in AVS) - -    Tobacco Social History   Tobacco Use  Smoking Status Former Smoker  . Quit date: 06/27/1995  . Years since quitting: 23.6  Smokeless Tobacco Never Used  Tobacco Comment   smoked 16-19, up to < 1 ppd     Counseling given: Not Answered Comment: smoked 16-19, up to < 1 ppd  Past Medical History:  Diagnosis Date  . Allergy   . Arthritis   . Baker's cyst    Left knee  . Hx of adenomatous polyp of colon 05/09/2017  . Hypertension   . Lactose intolerance   . Shingles 10/2008   RUE   Past Surgical History:  Procedure Laterality Date  . ABDOMINAL HYSTERECTOMY     Dr Connye Burkitt for fibroids  . COLONOSCOPY  12/11/2005   lymphoid aggregate removed (suspected polyp was not), melanosis coli   . PILONIDAL CYSTECTOMY     spine cyst   Family History  Problem Relation Age of Onset  . Hypertension Mother   . Breast cancer Mother   . Diabetes Father   . Stroke Father        MINI STROKES  . Stroke Sister 83  . Diabetes Sister   . Cancer Maternal Uncle        UNKNOWN TYPE  . Colon cancer Neg Hx   . Esophageal cancer Neg Hx   . Pancreatic cancer Neg Hx   . Rectal cancer Neg Hx   . Stomach cancer Neg Hx   . Hyperparathyroidism Neg Hx    Social History   Socioeconomic History  . Marital status: Married    Spouse name: Darnell Level  . Number of children: 1  . Years of education: Not on file  . Highest education level: Not on file  Occupational History  . Occupation: retired    Fish farm manager: [employer]  . Financial resource strain: Not hard at all  . Food insecurity    Worry: Never true    Inability: Never true  . Transportation needs    Medical: No    Non-medical: No  Tobacco Use  . Smoking status: Former Smoker    Quit date: 06/27/1995    Years since quitting: 23.6  . Smokeless tobacco: Never Used  . Tobacco comment: smoked 16-19, up to < 1  ppd  Substance and Sexual Activity  . Alcohol use: No  . Drug use: No  . Sexual activity: Yes  Lifestyle  . Physical activity    Days per week: 5 days    Minutes per session: 30 min  . Stress: Not at all  Relationships  . Social connections    Talks on phone: More than three times a week    Gets together: More than three times a week    Attends religious service: 1 to 4 times per year    Active member of club or organization: Yes    Attends meetings of clubs or organizations: 1 to 4 times per year    Relationship status: Married  Other Topics Concern  . Not on file  Social History Narrative   REGULAR EXERCISE    Outpatient Encounter Medications as of 02/14/2019  Medication Sig  . albuterol (PROVENTIL HFA;VENTOLIN HFA) 108 (90 Base) MCG/ACT inhaler Inhale 2 puffs into the lungs every 4 (four) hours as  needed for wheezing or shortness of breath.  . cetirizine (ZYRTEC) 10 MG tablet Take 1 tablet (10 mg total) by mouth daily.  Marland Kitchen diltiazem (CARDIZEM CD) 120 MG 24 hr capsule Take 1 capsule (120 mg total) by mouth daily.  . hydrocortisone (ANUSOL-HC) 25 MG suppository Place 1 suppository (25 mg total) rectally 2 (two) times daily.  Marland Kitchen lisinopril (ZESTRIL) 20 MG tablet Take 1 tablet (20 mg total) by mouth daily. Need office visit for refills.  . Multiple Vitamin (MULTIVITAMIN) capsule Take 1 capsule by mouth daily.  . naproxen sodium (ALEVE) 220 MG tablet Take 220 mg by mouth.  Marland Kitchen OVER THE COUNTER MEDICATION Tumeric one capsule a day.  . prednisoLONE acetate (PRED FORTE) 1 % ophthalmic suspension INSTILL 1 DROP 4 TIMES A DAY INTO BOTH EYES FOR 14 DAYS THEN TAPER UNTIL OUT  . [DISCONTINUED] diltiazem (CARDIZEM CD) 120 MG 24 hr capsule TAKE ONE CAPSULE BY MOUTH ONE TIME DAILY  . [DISCONTINUED] lisinopril (ZESTRIL) 20 MG tablet Take 1 tablet (20 mg total) by mouth daily. Need office visit for refills.  . [DISCONTINUED] fluticasone (FLONASE) 50 MCG/ACT nasal spray Place 1 spray into both nostrils daily. (Patient not taking: Reported on 02/14/2019)   No facility-administered encounter medications on file as of 02/14/2019.     Activities of Daily Living In your present state of health, do you have any difficulty performing the following activities: 02/14/2019  Hearing? N  Vision? N  Difficulty concentrating or making decisions? N  Walking or climbing stairs? N  Dressing or bathing? N  Doing errands, shopping? N  Preparing Food and eating ? N  Using the Toilet? N  In the past six months, have you accidently leaked urine? N  Do you have problems with loss of bowel control? N  Managing your Medications? N  Managing your Finances? N  Housekeeping or managing your Housekeeping? N  Some recent data might be hidden    Patient Care Team: Binnie Rail, MD as PCP - General (Internal Medicine) Renato Shin, MD as Consulting Physician (Endocrinology)    Assessment:   This is a routine wellness examination for Dent. Physical assessment deferred to PCP.   Exercise Activities and Dietary recommendations Current Exercise Habits: Home exercise routine, Type of exercise: walking(stationary bike), Time (Minutes): 30, Frequency (Times/Week): 5, Weekly Exercise (Minutes/Week): 150, Intensity: Mild, Exercise limited by: orthopedic condition(s)  Diet (meal preparation, eat out, water intake, caffeinated beverages, dairy products, fruits and vegetables): in general, a "healthy"  diet  , well balanced   Reviewed heart healthy diet. Encouraged patient to increase daily water and healthy fluid intake.  Goals    . Patient Stated       Fall Risk Fall Risk  02/14/2019 12/18/2017  Falls in the past year? 0 No  Number falls in past yr: 0 -   Depression Screen PHQ 2/9 Scores 02/14/2019 12/18/2017  PHQ - 2 Score 0 0     Cognitive Function       Ad8 score reviewed for issues:  Issues making decisions: no  Less interest in hobbies / activities: no  Repeats questions, stories (family complaining): no  Trouble using ordinary gadgets (microwave, computer, phone):no  Forgets the month or year: no  Mismanaging finances: no  Remembering appts: no  Daily problems with thinking and/or memory: no Ad8 score is= 0  Immunization History  Administered Date(s) Administered  . Hepatitis B 08/03/1987, 12/27/1987  . Influenza,inj,Quad PF,6-35 Mos 04/02/2018  . Influenza-Unspecified 03/22/2015, 03/12/2017  . Td 06/26/2001  . Tdap 02/11/2015  . Zoster 04/11/2013      Screening Tests Health Maintenance  Topic Date Due  . PNA vac Low Risk Adult (1 of 2 - PCV13) 12/12/2017  . INFLUENZA VACCINE  01/25/2019  . MAMMOGRAM  05/27/2020  . COLONOSCOPY  05/03/2024  . TETANUS/TDAP  08/15/2025  . DEXA SCAN  Completed  . Hepatitis C Screening  Completed      Plan:     Reviewed health maintenance  screenings with patient today and relevant education, vaccines, and/or referrals were provided.   Continue to eat heart healthy diet (full of fruits, vegetables, whole grains, lean protein, water--limit salt, fat, and sugar intake) and increase physical activity as tolerated.  Continue doing brain stimulating activities (puzzles, reading, adult coloring books, staying active) to keep memory sharp.   I have personally reviewed and noted the following in the patient's chart:   . Medical and social history . Use of alcohol, tobacco or illicit drugs  . Current medications and supplements . Functional ability and status . Nutritional status . Physical activity . Advanced directives . List of other physicians . Screenings to include cognitive, depression, and falls . Referrals and appointments  In addition, I have reviewed and discussed with patient certain preventive protocols, quality metrics, and best practice recommendations. A written personalized care plan for preventive services as well as general preventive health recommendations were provided to patient.     Michiel Cowboy, RN  02/14/2019    Medical screening examination/treatment/procedure(s) were performed by non-physician practitioner and as supervising physician I was immediately available for consultation/collaboration. I agree with above. Binnie Rail, MD

## 2019-02-14 ENCOUNTER — Ambulatory Visit (INDEPENDENT_AMBULATORY_CARE_PROVIDER_SITE_OTHER): Payer: Medicare HMO | Admitting: *Deleted

## 2019-02-14 VITALS — BP 111/68 | HR 68 | Temp 98.7°F | Ht 65.0 in | Wt 176.0 lb

## 2019-02-14 DIAGNOSIS — Z0000 Encounter for general adult medical examination without abnormal findings: Secondary | ICD-10-CM | POA: Diagnosis not present

## 2019-02-14 MED ORDER — LISINOPRIL 20 MG PO TABS: 20.0000 mg | ORAL_TABLET | Freq: Every day | ORAL | 0 refills | Status: DC

## 2019-02-14 MED ORDER — DILTIAZEM HCL ER COATED BEADS 120 MG PO CP24: 120.0000 mg | ORAL_CAPSULE | Freq: Every day | ORAL | 1 refills | Status: DC

## 2019-02-14 NOTE — Patient Instructions (Addendum)
The [facility] provides information and referral services to aging adults 65+ in [place]. If there are waiting lists for community services, or if services are not available, [facility] connects clients with [facility] volunteers close to them who provide services such as respite care, wheelchair ramp construction, friendly visits, and transportation assistance. [facility]'s Call Center fields more than 350 calls each month.  AAIRS*- and SHIIP*-certified Call Center Specialists are ready to lend a compassionate ear and seek resources Monday through Friday, 9:00 am - 5:00 pm. The Call Center has met the needs of aging adults in all of [place] 100 counties. No religious affiliation is required; the only eligibility criterion is that clients be 65+ or older. Contact [facility] for help. *[facility] Information and Referral Systems *Seniors' Health Insurance Information Program Need help? Call [phone] today!  Continue to eat heart healthy diet (full of fruits, vegetables, whole grains, lean protein, water--limit salt, fat, and sugar intake) and increase physical activity as tolerated.  Continue doing brain stimulating activities (puzzles, reading, adult coloring books, staying active) to keep memory sharp.    Angie Garcia , Thank you for taking time to come for your Medicare Wellness Visit. I appreciate your ongoing commitment to your health goals. Please review the following plan we discussed and let me know if I can assist you in the future.   These are the goals we discussed: Goals    . Patient Stated     I want to lose weight and maintain it by continuing to eat healthy and exercise.        This is a list of the screening recommended for you and due dates:  Health Maintenance  Topic Date Due  . Pneumonia vaccines (1 of 2 - PCV13) 12/12/2017  . Flu Shot  01/25/2019  . Mammogram  05/27/2020  . Colon Cancer Screening  05/03/2024  . Tetanus Vaccine  08/15/2025  . DEXA scan (bone density  measurement)  Completed  .  Hepatitis C: One time screening is recommended by Center for Disease Control  (CDC) for  adults born from [birth year] through 1963.   Completed    Preventive Care 64 Years and Older, Female Preventive care refers to lifestyle choices and visits with your health care provider that can promote health and wellness. This includes:  A yearly physical exam. This is also called an annual well check.  Regular dental and eye exams.  Immunizations.  Screening for certain conditions.  Healthy lifestyle choices, such as diet and exercise. What can I expect for my preventive care visit? Physical exam Your health care provider will check:  Height and weight. These may be used to calculate body mass index (BMI), which is a measurement that tells if you are at a healthy weight.  Heart rate and blood pressure.  Your skin for abnormal spots. Counseling Your health care provider may ask you questions about:  Alcohol, tobacco, and drug use.  Emotional well-being.  Home and relationship well-being.  Sexual activity.  Eating habits.  History of falls.  Memory and ability to understand (cognition).  Work and work Statistician.  Pregnancy and menstrual history. What immunizations do I need?  Influenza (flu) vaccine  This is recommended every year. Tetanus, diphtheria, and pertussis (Tdap) vaccine  You may need a Td booster every 10 years. Varicella (chickenpox) vaccine  You may need this vaccine if you have not already been vaccinated. Zoster (shingles) vaccine  You may need this after age 34. Pneumococcal conjugate (PCV13) vaccine  One dose is recommended after age 40. Pneumococcal polysaccharide (PPSV23) vaccine  One dose is recommended after age 66. Measles, mumps, and rubella (MMR) vaccine  You may need at least one dose of MMR if you were born in 1957 or later. You may also need a second dose. Meningococcal conjugate (MenACWY) vaccine  You may  need this if you have certain conditions. Hepatitis A vaccine  You may need this if you have certain conditions or if you travel or work in places where you may be exposed to hepatitis A. Hepatitis B vaccine  You may need this if you have certain conditions or if you travel or work in places where you may be exposed to hepatitis B. Haemophilus influenzae type b (Hib) vaccine  You may need this if you have certain conditions. You may receive vaccines as individual doses or as more than one vaccine together in one shot (combination vaccines). Talk with your health care provider about the risks and benefits of combination vaccines. What tests do I need? Blood tests  Lipid and cholesterol levels. These may be checked every 5 years, or more frequently depending on your overall health.  Hepatitis C test.  Hepatitis B test. Screening  Lung cancer screening. You may have this screening every year starting at age 24 if you have a 30-pack-year history of smoking and currently smoke or have quit within the past 15 years.  Colorectal cancer screening. All adults should have this screening starting at age 52 and continuing until age 75. Your health care provider may recommend screening at age 35 if you are at increased risk. You will have tests every 1-10 years, depending on your results and the type of screening test.  Diabetes screening. This is done by checking your blood sugar (glucose) after you have not eaten for a while (fasting). You may have this done every 1-3 years.  Mammogram. This may be done every 1-2 years. Talk with your health care provider about how often you should have regular mammograms.  BRCA-related cancer screening. This may be done if you have a family history of breast, ovarian, tubal, or peritoneal cancers. Other tests  Sexually transmitted disease (STD) testing.  Bone density scan. This is done to screen for osteoporosis. You may have this done starting at age  68. Follow these instructions at home: Eating and drinking  Eat a diet that includes fresh fruits and vegetables, whole grains, lean protein, and low-fat dairy products. Limit your intake of foods with high amounts of sugar, saturated fats, and salt.  Take vitamin and mineral supplements as recommended by your health care provider.  Do not drink alcohol if your health care provider tells you not to drink.  If you drink alcohol: ? Limit how much you have to 0-1 drink a day. ? Be aware of how much alcohol is in your drink. In the [place] one drink equals one 12 oz bottle of beer (355 mL), one 5 oz glass of  (148 mL), or one 1 oz glass of hard liquor (44 mL). Lifestyle  Take daily care of your teeth and gums.  Stay active. Exercise for at least 30 minutes on 5 or more days each week.  Do not use any products that contain nicotine or tobacco, such as cigarettes, e-cigarettes, and chewing tobacco. If you need help quitting, ask your health care provider.  If you are sexually active, practice safe sex. Use a condom or other form of protection in order to prevent STIs (sexually  transmitted infections).  Talk with your health care provider about taking a low-dose aspirin or statin. What's next?  Go to your health care provider once a year for a well check visit.  Ask your health care provider how often you should have your eyes and teeth checked.  Stay up to date on all vaccines. This information is not intended to replace advice given to you by your health care provider. Make sure you discuss any questions you have with your health care provider. Document Released: 07/09/2015 Document Revised: 06/06/2018 Document Reviewed: 06/06/2018 Elsevier Patient Education  2020 [employer].

## 2019-03-24 NOTE — Progress Notes (Signed)
Subjective:    Patient ID: Angie Garcia, female    DOB: 03-Nov-1950, 68 y.o.   MRN: [id number]  HPI She is here for a physical exam.   She denies any changes in her health since she was here last.  She feels good and has no concerns.  She did not eat this morning and thinks that is why her BP is slightly elevated.   Medications and allergies reviewed with patient and updated if appropriate.  Patient Active Problem List   Diagnosis Date Noted  . Hyperparathyroidism, primary ([place]) 03/14/2018  . Multinodular goiter 03/14/2018  . Hx of adenomatous polyp of colon 05/09/2017  . Cyst of left kidney 10/21/2015  . Prediabetes 08/19/2015  . Hypercalcemia 06/03/2014  . Hyperlipidemia 06/02/2014  . DJD (degenerative joint disease) of knee 04/01/2013  . Hemorrhoids, internal, with bleeding 07/04/2011  . LACTOSE INTOLERANCE 11/18/2009  . RHINITIS 11/18/2009  . History of herpes zoster 10/28/2008  . Essential hypertension 01/24/2008    Current Outpatient Medications on File Prior to Visit  Medication Sig Dispense Refill  . albuterol (PROVENTIL HFA;VENTOLIN HFA) 108 (90 Base) MCG/ACT inhaler Inhale 2 puffs into the lungs every 4 (four) hours as needed for wheezing or shortness of breath. 1 Inhaler 0  . cetirizine (ZYRTEC) 10 MG tablet Take 1 tablet (10 mg total) by mouth daily. 30 tablet 0  . diltiazem (CARDIZEM CD) 120 MG 24 hr capsule Take 1 capsule (120 mg total) by mouth daily. 90 capsule 1  . lisinopril (ZESTRIL) 20 MG tablet Take 1 tablet (20 mg total) by mouth daily. Need office visit for refills. 30 tablet 0  . Multiple Vitamin (MULTIVITAMIN) capsule Take 1 capsule by mouth daily.    . naproxen sodium (ALEVE) 220 MG tablet Take 220 mg by mouth.    Marland Kitchen OVER THE COUNTER MEDICATION Tumeric one capsule a day.    . TURMERIC PO Take by mouth.     No current facility-administered medications on file prior to visit.     Past Medical History:  Diagnosis Date  . Allergy   . Arthritis    . Baker's cyst    Left knee  . Hx of adenomatous polyp of colon 05/09/2017  . Hypertension   . Lactose intolerance   . Shingles 10/2008   RUE    Past Surgical History:  Procedure Laterality Date  . ABDOMINAL HYSTERECTOMY     Dr Connye Burkitt for fibroids  . COLONOSCOPY  12/11/2005   lymphoid aggregate removed (suspected polyp was not), melanosis coli  . PILONIDAL CYSTECTOMY     spine cyst    Social History   Socioeconomic History  . Marital status: Married    Spouse name: Darnell Level  . Number of children: 1  . Years of education: Not on file  . Highest education level: Not on file  Occupational History  . Occupation: retired    Fish farm manager: [employer]  . Financial resource strain: Not hard at all  . Food insecurity    Worry: Never true    Inability: Never true  . Transportation needs    Medical: No    Non-medical: No  Tobacco Use  . Smoking status: Former Smoker    Quit date: 06/27/1995    Years since quitting: 23.7  . Smokeless tobacco: Never Used  . Tobacco comment: smoked 16-19, up to < 1 ppd  Substance and Sexual Activity  . Alcohol use: No  . Drug use: No  . Sexual activity:  Yes  Lifestyle  . Physical activity    Days per week: 5 days    Minutes per session: 30 min  . Stress: Not at all  Relationships  . Social connections    Talks on phone: More than three times a week    Gets together: More than three times a week    Attends religious service: 1 to 4 times per year    Active member of club or organization: Yes    Attends meetings of clubs or organizations: 1 to 4 times per year    Relationship status: Married  Other Topics Concern  . Not on file  Social History Narrative   REGULAR EXERCISE    Family History  Problem Relation Age of Onset  . Hypertension Mother   . Breast cancer Mother   . Diabetes Father   . Stroke Father        MINI STROKES  . Stroke Sister 11  . Diabetes Sister   . Cancer Maternal Uncle        UNKNOWN TYPE  .  Colon cancer Neg Hx   . Esophageal cancer Neg Hx   . Pancreatic cancer Neg Hx   . Rectal cancer Neg Hx   . Stomach cancer Neg Hx   . Hyperparathyroidism Neg Hx     Review of Systems  Constitutional: Negative for chills, fatigue and fever.  Eyes: Negative for visual disturbance.  Respiratory: Negative for cough, shortness of breath and wheezing.   Cardiovascular: Negative for chest pain, palpitations and leg swelling.  Gastrointestinal: Negative for abdominal pain, blood in stool, constipation, diarrhea and nausea.       No gerd  Genitourinary: Negative for dysuria and hematuria.  Musculoskeletal: Positive for arthralgias (left knee). Negative for back pain.  Skin: Negative for color change and rash.  Neurological: Positive for headaches (occasional). Negative for dizziness, light-headedness and numbness.  Psychiatric/Behavioral: Negative for dysphoric mood. The patient is not nervous/anxious.        Objective:   Vitals:   03/25/19 0808  BP: (!) 144/82  Pulse: 72  Resp: 16  Temp: 98.5 F (36.9 C)  SpO2: 99%   Filed Weights   03/25/19 0808  Weight: 178 lb (80.7 kg)   Body mass index is 29.62 kg/m.  BP Readings from Last 3 Encounters:  03/25/19 (!) 144/82  02/14/19 111/68  06/18/18 118/72    Wt Readings from Last 3 Encounters:  03/25/19 178 lb (80.7 kg)  02/14/19 176 lb (79.8 kg)  06/18/18 170 lb 1.9 oz (77.2 kg)     Physical Exam Constitutional: She appears well-developed and well-nourished. No distress.  HENT:  Head: Normocephalic and atraumatic.  Right Ear: External ear normal. Normal ear canal and TM Left Ear: External ear normal.  Normal ear canal and TM Mouth/Throat: Oropharynx is clear and moist.  Eyes: Conjunctivae and EOM are normal.  Neck: Neck supple. No tracheal deviation present.  thyromegaly present. No carotid bruit  Cardiovascular: Normal rate, regular rhythm and normal heart sounds.  No murmur heard.  No edema. Pulmonary/Chest: Effort  normal and breath sounds normal. No respiratory distress. She has no wheezes. She has no rales.  Breast: deferred   Abdominal: Soft. She exhibits no distension. There is no tenderness.  Lymphadenopathy: She has no cervical adenopathy.  Skin: Skin is warm and dry. She is not diaphoretic.  Psychiatric: She has a normal mood and affect. Her behavior is normal.        Assessment & Plan:  Physical exam: Screening blood work    ordered Immunizations  Flu vaccine deferred,  prevnar deferred, shingrix discussed  Colonoscopy  Up to date  Mammogram  Up to date  Gyn  No longer seeing Dexa  Up to date  Eye exams  Up to date  Exercise    Walks 3-4 days a week Weight  overweight Substance abuse   none  See Problem List for Assessment and Plan of chronic medical problems.   FU in one year

## 2019-03-25 ENCOUNTER — Other Ambulatory Visit (INDEPENDENT_AMBULATORY_CARE_PROVIDER_SITE_OTHER): Payer: Medicare HMO

## 2019-03-25 ENCOUNTER — Ambulatory Visit (INDEPENDENT_AMBULATORY_CARE_PROVIDER_SITE_OTHER): Payer: Medicare HMO | Admitting: Internal Medicine

## 2019-03-25 ENCOUNTER — Encounter: Payer: Self-pay | Admitting: Internal Medicine

## 2019-03-25 ENCOUNTER — Other Ambulatory Visit: Payer: Self-pay

## 2019-03-25 VITALS — BP 144/82 | HR 72 | Temp 98.5°F | Resp 16 | Ht 65.0 in | Wt 178.0 lb

## 2019-03-25 DIAGNOSIS — E210 Primary hyperparathyroidism: Secondary | ICD-10-CM | POA: Diagnosis not present

## 2019-03-25 DIAGNOSIS — R7303 Prediabetes: Secondary | ICD-10-CM

## 2019-03-25 DIAGNOSIS — E042 Nontoxic multinodular goiter: Secondary | ICD-10-CM | POA: Diagnosis not present

## 2019-03-25 DIAGNOSIS — Z0000 Encounter for general adult medical examination without abnormal findings: Secondary | ICD-10-CM

## 2019-03-25 DIAGNOSIS — I10 Essential (primary) hypertension: Secondary | ICD-10-CM

## 2019-03-25 DIAGNOSIS — E782 Mixed hyperlipidemia: Secondary | ICD-10-CM

## 2019-03-25 LAB — COMPREHENSIVE METABOLIC PANEL
ALT: 13 U/L (ref 0–35)
AST: 17 U/L (ref 0–37)
Albumin: 4.2 g/dL (ref 3.5–5.2)
Alkaline Phosphatase: 81 U/L (ref 39–117)
BUN: 13 mg/dL (ref 6–23)
CO2: 24 mEq/L (ref 19–32)
Calcium: 11.3 mg/dL — ABNORMAL HIGH (ref 8.4–10.5)
Chloride: 108 mEq/L (ref 96–112)
Creatinine, Ser: 0.78 mg/dL (ref 0.40–1.20)
GFR: 89.33 mL/min (ref >60.00)
Glucose, Bld: 97 mg/dL (ref 70–99)
Potassium: 4.1 mEq/L (ref 3.5–5.1)
Sodium: 140 mEq/L (ref 135–145)
Total Bilirubin: 0.3 mg/dL (ref 0.2–1.2)
Total Protein: 7 g/dL (ref 6.0–8.3)

## 2019-03-25 LAB — LIPID PANEL
Cholesterol: 227 mg/dL — ABNORMAL HIGH (ref 0–200)
HDL: 74 mg/dL (ref >39.00)
LDL Cholesterol: 135 mg/dL — ABNORMAL HIGH (ref 0–99)
NonHDL: 152.92
Total CHOL/HDL Ratio: 3
Triglycerides: 88 mg/dL (ref 0.0–149.0)
VLDL: 17.6 mg/dL (ref 0.0–40.0)

## 2019-03-25 LAB — CBC WITH DIFFERENTIAL/PLATELET
Basophils Absolute: 0.1 10*3/uL (ref 0.0–0.1)
Basophils Relative: 1.3 % (ref 0.0–3.0)
Eosinophils Absolute: 0.1 10*3/uL (ref 0.0–0.7)
Eosinophils Relative: 2.4 % (ref 0.0–5.0)
HCT: 39.7 % (ref 36.0–46.0)
Hemoglobin: 13.1 g/dL (ref 12.0–15.0)
Lymphocytes Relative: 30.5 % (ref 12.0–46.0)
Lymphs Abs: 1.6 10*3/uL (ref 0.7–4.0)
MCHC: 33 g/dL (ref 30.0–36.0)
MCV: 86.5 fl (ref 78.0–100.0)
Monocytes Absolute: 0.4 10*3/uL (ref 0.1–1.0)
Monocytes Relative: 8.2 % (ref 3.0–12.0)
Neutro Abs: 3 10*3/uL (ref 1.4–7.7)
Neutrophils Relative %: 57.6 % (ref 43.0–77.0)
Platelets: 348 10*3/uL (ref 150.0–400.0)
RBC: 4.59 Mil/uL (ref 3.87–5.11)
RDW: 13.4 % (ref 11.5–15.5)
WBC: 5.2 10*3/uL (ref 4.0–10.5)

## 2019-03-25 LAB — TSH: TSH: 0.74 u[IU]/mL (ref 0.35–4.50)

## 2019-03-25 LAB — HEMOGLOBIN A1C: Hgb A1c MFr Bld: 5.8 % (ref 4.6–6.5)

## 2019-03-25 MED ORDER — DILTIAZEM HCL ER COATED BEADS 120 MG PO CP24: 120.0000 mg | ORAL_CAPSULE | Freq: Every day | ORAL | 3 refills | Status: DC

## 2019-03-25 MED ORDER — LISINOPRIL 20 MG PO TABS: 20.0000 mg | ORAL_TABLET | Freq: Every day | ORAL | 3 refills | Status: DC

## 2019-03-25 NOTE — Assessment & Plan Note (Signed)
Check a1c Low sugar / carb diet Stressed regular exercise   

## 2019-03-25 NOTE — Assessment & Plan Note (Signed)
BP Readings from Last 3 Encounters:  03/25/19 (!) 144/82  02/14/19 111/68  06/18/18 118/72    BP well controlled Current regimen effective and well tolerated Continue current medications at current doses cmp

## 2019-03-25 NOTE — Assessment & Plan Note (Signed)
Saw Dr Loanne Drilling 02/2018 - no treatment needed Will monitor Check PTH, calcium Bone density normal

## 2019-03-25 NOTE — Patient Instructions (Signed)
Tests ordered today. Your results will be released to MyChart (or called to you) after review.  If any changes need to be made, you will be notified at that same time.  All other Health Maintenance issues reviewed.   All recommended immunizations and age-appropriate screenings are up-to-date or discussed.  No immunization administered today.   Medications reviewed and updated.  Changes include :   none  Your prescription(s) have been submitted to your pharmacy. Please take as directed and contact our office if you believe you are having problem(s) with the medication(s).   Please followup in 1 year    Health Maintenance, Female Adopting a healthy lifestyle and getting preventive care are important in promoting health and wellness. Ask your health care provider about:  The right schedule for you to have regular tests and exams.  Things you can do on your own to prevent diseases and keep yourself healthy. What should I know about diet, weight, and exercise? Eat a healthy diet   Eat a diet that includes plenty of vegetables, fruits, low-fat dairy products, and lean protein.  Do not eat a lot of foods that are high in solid fats, added sugars, or sodium. Maintain a healthy weight Body mass index (BMI) is used to identify weight problems. It estimates body fat based on height and weight. Your health care provider can help determine your BMI and help you achieve or maintain a healthy weight. Get regular exercise Get regular exercise. This is one of the most important things you can do for your health. Most adults should:  Exercise for at least 150 minutes each week. The exercise should increase your heart rate and make you sweat (moderate-intensity exercise).  Do strengthening exercises at least twice a week. This is in addition to the moderate-intensity exercise.  Spend less time sitting. Even light physical activity can be beneficial. Watch cholesterol and blood lipids Have your  blood tested for lipids and cholesterol at 66 years of age, then have this test every 5 years. Have your cholesterol levels checked more often if:  Your lipid or cholesterol levels are high.  You are older than 66 years of age.  You are at high risk for heart disease. What should I know about cancer screening? Depending on your health history and family history, you may need to have cancer screening at various ages. This may include screening for:  Breast cancer.  Cervical cancer.  Colorectal cancer.  Skin cancer.  Lung cancer. What should I know about heart disease, diabetes, and high blood pressure? Blood pressure and heart disease  High blood pressure causes heart disease and increases the risk of stroke. This is more likely to develop in people who have high blood pressure readings, are of African descent, or are overweight.  Have your blood pressure checked: ? Every 3-5 years if you are 18-39 years of age. ? Every year if you are 40 years old or older. Diabetes Have regular diabetes screenings. This checks your fasting blood sugar level. Have the screening done:  Once every three years after age 40 if you are at a normal weight and have a low risk for diabetes.  More often and at a younger age if you are overweight or have a high risk for diabetes. What should I know about preventing infection? Hepatitis B If you have a higher risk for hepatitis B, you should be screened for this virus. Talk with your health care provider to find out if you are at risk   for hepatitis B infection. Hepatitis C Testing is recommended for:  Everyone born from 1945 through 1965.  Anyone with known risk factors for hepatitis C. Sexually transmitted infections (STIs)  Get screened for STIs, including gonorrhea and chlamydia, if: ? You are sexually active and are younger than 66 years of age. ? You are older than 66 years of age and your health care provider tells you that you are at risk  for this type of infection. ? Your sexual activity has changed since you were last screened, and you are at increased risk for chlamydia or gonorrhea. Ask your health care provider if you are at risk.  Ask your health care provider about whether you are at high risk for HIV. Your health care provider may recommend a prescription medicine to help prevent HIV infection. If you choose to take medicine to prevent HIV, you should first get tested for HIV. You should then be tested every 3 months for as long as you are taking the medicine. Pregnancy  If you are about to stop having your period (premenopausal) and you may become pregnant, seek counseling before you get pregnant.  Take 400 to 800 micrograms (mcg) of folic acid every day if you become pregnant.  Ask for birth control (contraception) if you want to prevent pregnancy. Osteoporosis and menopause Osteoporosis is a disease in which the bones lose minerals and strength with aging. This can result in bone fractures. If you are 65 years old or older, or if you are at risk for osteoporosis and fractures, ask your health care provider if you should:  Be screened for bone loss.  Take a calcium or vitamin D supplement to lower your risk of fractures.  Be given hormone replacement therapy (HRT) to treat symptoms of menopause. Follow these instructions at home: Lifestyle  Do not use any products that contain nicotine or tobacco, such as cigarettes, e-cigarettes, and chewing tobacco. If you need help quitting, ask your health care provider.  Do not use street drugs.  Do not share needles.  Ask your health care provider for help if you need support or information about quitting drugs. Alcohol use  Do not drink alcohol if: ? Your health care provider tells you not to drink. ? You are pregnant, may be pregnant, or are planning to become pregnant.  If you drink alcohol: ? Limit how much you use to 0-1 drink a day. ? Limit intake if you are  breastfeeding.  Be aware of how much alcohol is in your drink. In the [place] one drink equals one 12 oz bottle of beer (355 mL), one 5 oz glass of wine (148 mL), or one 1 oz glass of hard liquor (44 mL). General instructions  Schedule regular health, dental, and eye exams.  Stay current with your vaccines.  Tell your health care provider if: ? You often feel depressed. ? You have ever been abused or do not feel safe at home. Summary  Adopting a healthy lifestyle and getting preventive care are important in promoting health and wellness.  Follow your health care provider's instructions about healthy diet, exercising, and getting tested or screened for diseases.  Follow your health care provider's instructions on monitoring your cholesterol and blood pressure. This information is not intended to replace advice given to you by your health care provider. Make sure you discuss any questions you have with your health care provider. Document Released: 12/26/2010 Document Revised: 06/05/2018 Document Reviewed: 06/05/2018 Elsevier Patient Education  2020 Elsevier Inc.  

## 2019-03-25 NOTE — Assessment & Plan Note (Addendum)
Thyroid US 2019 - no concerning nodules - no f/u US needed tsh

## 2019-03-25 NOTE — Assessment & Plan Note (Signed)
Check lipid panel, cmp, tsh  Lifestyle controlled Regular exercise and healthy diet encouraged  

## 2019-03-27 LAB — PTH, INTACT AND CALCIUM
Calcium: 11.2 mg/dL — ABNORMAL HIGH (ref 8.6–10.4)
PTH: 34 pg/mL (ref 14–64)

## 2019-03-29 ENCOUNTER — Encounter: Payer: Self-pay | Admitting: Internal Medicine

## 2019-04-14 ENCOUNTER — Other Ambulatory Visit (HOSPITAL_BASED_OUTPATIENT_CLINIC_OR_DEPARTMENT_OTHER): Payer: Self-pay | Admitting: Internal Medicine

## 2019-04-14 DIAGNOSIS — Z1231 Encounter for screening mammogram for malignant neoplasm of breast: Secondary | ICD-10-CM

## 2019-05-17 DIAGNOSIS — R69 Illness, unspecified: Secondary | ICD-10-CM | POA: Diagnosis not present

## 2019-05-29 ENCOUNTER — Ambulatory Visit (HOSPITAL_BASED_OUTPATIENT_CLINIC_OR_DEPARTMENT_OTHER)
Admission: RE | Admit: 2019-05-29 | Discharge: 2019-05-29 | Disposition: A | Discharge status: Home / Self Care | Payer: Medicare HMO | Source: Ambulatory Visit | Attending: Internal Medicine | Admitting: Internal Medicine

## 2019-05-29 ENCOUNTER — Other Ambulatory Visit: Payer: Self-pay

## 2019-05-29 DIAGNOSIS — Z1231 Encounter for screening mammogram for malignant neoplasm of breast: Secondary | ICD-10-CM | POA: Insufficient documentation

## 2019-05-30 ENCOUNTER — Other Ambulatory Visit: Payer: Self-pay | Admitting: Internal Medicine

## 2019-05-30 DIAGNOSIS — R928 Other abnormal and inconclusive findings on diagnostic imaging of breast: Secondary | ICD-10-CM

## 2019-06-26 DIAGNOSIS — H52223 Regular astigmatism, bilateral: Secondary | ICD-10-CM | POA: Diagnosis not present

## 2019-06-26 DIAGNOSIS — H524 Presbyopia: Secondary | ICD-10-CM | POA: Diagnosis not present

## 2019-06-26 DIAGNOSIS — H5203 Hypermetropia, bilateral: Secondary | ICD-10-CM | POA: Diagnosis not present

## 2019-06-30 ENCOUNTER — Ambulatory Visit
Admission: RE | Admit: 2019-06-30 | Discharge: 2019-06-30 | Disposition: A | Discharge status: Home / Self Care | Payer: Medicare HMO | Source: Ambulatory Visit | Attending: Internal Medicine | Admitting: Internal Medicine

## 2019-06-30 ENCOUNTER — Other Ambulatory Visit: Payer: Self-pay

## 2019-06-30 DIAGNOSIS — N6002 Solitary cyst of left breast: Secondary | ICD-10-CM | POA: Diagnosis not present

## 2019-06-30 DIAGNOSIS — R928 Other abnormal and inconclusive findings on diagnostic imaging of breast: Secondary | ICD-10-CM

## 2019-08-17 ENCOUNTER — Ambulatory Visit: Payer: Medicare HMO | Attending: Internal Medicine

## 2019-08-17 DIAGNOSIS — Z23 Encounter for immunization: Secondary | ICD-10-CM

## 2019-08-17 NOTE — Progress Notes (Signed)
   Covid-19 Vaccination Clinic  Name:  Angie Garcia    MRN: [id number] DOB: May 13, 1951  08/17/2019  [name] was observed post Covid-19 immunization for 15 minutes without incidence. She was provided with Vaccine Information Sheet and instruction to access the V-Safe system.   [name] was instructed to call 911 with any severe reactions post vaccine: Marland Kitchen Difficulty breathing  . Swelling of your face and throat  . A fast heartbeat  . A bad rash all over your body  . Dizziness and weakness    Immunizations Administered    Name Date Dose VIS Date Route   Pfizer COVID-19 Vaccine 08/17/2019  8:25 AM 0.3 mL 06/06/2019 Intramuscular   Manufacturer: [place]   Lot: [id number]   [place]: [id number]

## 2019-09-10 ENCOUNTER — Ambulatory Visit: Payer: Medicare HMO | Attending: Internal Medicine

## 2019-09-10 DIAGNOSIS — Z23 Encounter for immunization: Secondary | ICD-10-CM

## 2019-09-10 NOTE — Progress Notes (Signed)
   Covid-19 Vaccination Clinic  Name:  Angie Garcia    MRN: [id number] DOB: [birth year]  09/10/2019  Angie Garcia was observed post Covid-19 immunization for 15 minutes without incident. She was provided with Vaccine Information Sheet and instruction to access the V-Safe system.   Angie Garcia was instructed to call 911 with any severe reactions post vaccine: Marland Kitchen Difficulty breathing  . Swelling of face and throat  . A fast heartbeat  . A bad rash all over body  . Dizziness and weakness   Immunizations Administered    Name Date Dose VIS Date Route   Pfizer COVID-19 Vaccine 09/10/2019  8:19 AM 0.3 mL 06/06/2019 Intramuscular   Manufacturer: [place]   Lot: [id number]   [place]: [id number]

## 2019-09-22 DIAGNOSIS — H401111 Primary open-angle glaucoma, right eye, mild stage: Secondary | ICD-10-CM | POA: Diagnosis not present

## 2019-09-22 DIAGNOSIS — H401123 Primary open-angle glaucoma, left eye, severe stage: Secondary | ICD-10-CM | POA: Diagnosis not present

## 2019-10-20 DIAGNOSIS — H401123 Primary open-angle glaucoma, left eye, severe stage: Secondary | ICD-10-CM | POA: Diagnosis not present

## 2019-10-20 DIAGNOSIS — H401111 Primary open-angle glaucoma, right eye, mild stage: Secondary | ICD-10-CM | POA: Diagnosis not present

## 2019-10-20 DIAGNOSIS — H04123 Dry eye syndrome of bilateral lacrimal glands: Secondary | ICD-10-CM | POA: Diagnosis not present

## 2019-11-17 DIAGNOSIS — H401111 Primary open-angle glaucoma, right eye, mild stage: Secondary | ICD-10-CM | POA: Diagnosis not present

## 2019-11-17 DIAGNOSIS — H401123 Primary open-angle glaucoma, left eye, severe stage: Secondary | ICD-10-CM | POA: Diagnosis not present

## 2019-12-14 ENCOUNTER — Other Ambulatory Visit: Payer: Self-pay | Admitting: Internal Medicine

## 2019-12-24 DIAGNOSIS — H2513 Age-related nuclear cataract, bilateral: Secondary | ICD-10-CM | POA: Diagnosis not present

## 2020-01-28 DIAGNOSIS — H5203 Hypermetropia, bilateral: Secondary | ICD-10-CM | POA: Diagnosis not present

## 2020-01-28 DIAGNOSIS — H52209 Unspecified astigmatism, unspecified eye: Secondary | ICD-10-CM | POA: Diagnosis not present

## 2020-01-28 DIAGNOSIS — H524 Presbyopia: Secondary | ICD-10-CM | POA: Diagnosis not present

## 2020-02-20 ENCOUNTER — Ambulatory Visit: Payer: Medicare HMO

## 2020-02-27 DIAGNOSIS — H40013 Open angle with borderline findings, low risk, bilateral: Secondary | ICD-10-CM | POA: Diagnosis not present

## 2020-04-01 ENCOUNTER — Ambulatory Visit (INDEPENDENT_AMBULATORY_CARE_PROVIDER_SITE_OTHER): Payer: Medicare HMO

## 2020-04-01 ENCOUNTER — Encounter: Payer: Medicare HMO | Admitting: Internal Medicine

## 2020-04-01 DIAGNOSIS — Z0000 Encounter for general adult medical examination without abnormal findings: Secondary | ICD-10-CM

## 2020-04-01 NOTE — Progress Notes (Addendum)
Subjective:   Angie Garcia is a 69 y.o. female who presents for Medicare Annual (Subsequent) preventive examination.  Review of Systems    No ROS. Medicare Wellness Visit. Additional risk factors are reflected in social history. Cardiac Risk Factors include: advanced age (>60men, >34 women);dyslipidemia;family history of premature cardiovascular disease;hypertension Sleep Patterns: No sleep issues, feels rested on waking and sleeps 6-8 hours nightly. Home Safety/Smoke Alarms: Feels safe in home; uses home alarm. Smoke alarms in place. Living environment: 2-story home; Lives with spouse; no needs for DME; good support system. Seat Belt Safety/Bike Helmet: Wears seat belt.    Objective:    There were no vitals filed for this visit. There is no height or weight on file to calculate BMI.  Advanced Directives 04/01/2020 02/14/2019 04/19/2017 09/19/2016  Does Patient Have a Medical Advance Directive? No No No No  Does patient want to make changes to medical advance directive? - Yes (ED - Information included in AVS) - -  Would patient like information on creating a medical advance directive? No - Patient declined - - -    Current Medications (verified) Outpatient Encounter Medications as of 04/01/2020  Medication Sig  . albuterol (PROVENTIL HFA;VENTOLIN HFA) 108 (90 Base) MCG/ACT inhaler Inhale 2 puffs into the lungs every 4 (four) hours as needed for wheezing or shortness of breath.  . cetirizine (ZYRTEC) 10 MG tablet Take 1 tablet (10 mg total) by mouth daily.  Marland Kitchen diltiazem (CARDIZEM CD) 120 MG 24 hr capsule TAKE ONE CAPSULE BY MOUTH ONE TIME DAILY  . lisinopril (ZESTRIL) 20 MG tablet TAKE ONE TABLET BY MOUTH ONE TIME DAILY  . Multiple Vitamin (MULTIVITAMIN) capsule Take 1 capsule by mouth daily.  . naproxen sodium (ALEVE) 220 MG tablet Take 220 mg by mouth.  Marland Kitchen OVER THE COUNTER MEDICATION Tumeric one capsule a day.  . TURMERIC PO Take by mouth.   No facility-administered encounter  medications on file as of 04/01/2020.    Allergies (verified) Sulfamethoxazole-trimethoprim   History: Past Medical History:  Diagnosis Date  . Allergy   . Arthritis   . Baker's cyst    Left knee  . Hx of adenomatous polyp of colon 05/09/2017  . Hypertension   . Lactose intolerance   . Shingles 10/2008   RUE   Past Surgical History:  Procedure Laterality Date  . ABDOMINAL HYSTERECTOMY     Dr Connye Burkitt for fibroids  . COLONOSCOPY  12/11/2005   lymphoid aggregate removed (suspected polyp was not), melanosis coli  . PILONIDAL CYSTECTOMY     spine cyst   Family History  Problem Relation Age of Onset  . Hypertension Mother   . Breast cancer Mother        8s  . Diabetes Father   . Stroke Father        MINI STROKES  . Stroke Sister 57  . Diabetes Sister   . Cancer Maternal Uncle        UNKNOWN TYPE  . Colon cancer Neg Hx   . Esophageal cancer Neg Hx   . Pancreatic cancer Neg Hx   . Rectal cancer Neg Hx   . Stomach cancer Neg Hx   . Hyperparathyroidism Neg Hx    Social History   Socioeconomic History  . Marital status: Married    Spouse name: Darnell Level  . Number of children: 1  . Years of education: Not on file  . Highest education level: Not on file  Occupational History  . Occupation: retired  Employer: [employer]  Tobacco Use  . Smoking status: Former Smoker    Quit date: 06/27/1995    Years since quitting: 24.7  . Smokeless tobacco: Never Used  . Tobacco comment: smoked [age], up to < 1 ppd  Vaping Use  . Vaping Use: Never used  Substance and Sexual Activity  . Alcohol use: No  . Drug use: No  . Sexual activity: Yes  Other Topics Concern  . Not on file  Social History Narrative   REGULAR EXERCISE   Social Determinants of Health   Financial Resource Strain: Low Risk   . Difficulty of Paying Living Expenses: Not hard at all  Food Insecurity: No Food Insecurity  . Worried About Charity fundraiser in the Last Year: Never true  . Ran Out of Food in  the Last Year: Never true  Transportation Needs: No Transportation Needs  . Lack of Transportation (Medical): No  . Lack of Transportation (Non-Medical): No  Physical Activity: Sufficiently Active  . Days of Exercise per Week: 5 days  . Minutes of Exercise per Session: 30 min  Stress: No Stress Concern Present  . Feeling of Stress : Not at all  Social Connections: Socially Integrated  . Frequency of Communication with Friends and Family: More than three times a week  . Frequency of Social Gatherings with Friends and Family: More than three times a week  . Attends Religious Services: More than 4 times per year  . Active Member of Clubs or Organizations: Yes  . Attends Archivist Meetings: More than 4 times per year  . Marital Status: Married    Tobacco Counseling Counseling given: Not Answered Comment: smoked 18-19, up to < 1 ppd   Clinical Intake:  Pre-visit preparation completed: Yes  Pain : No/denies pain     Nutritional Risks: None Diabetes: No  How often do you need to have someone help you when you read instructions, pamphlets, or other written materials from your doctor or pharmacy?: 1 - Never What is the last grade level you completed in school?: HSG  Diabetic?NO  Interpreter Needed?: No  Information entered by :: Lisette Abu, LPN   Activities of Daily Living In your present state of health, do you have any difficulty performing the following activities: 04/01/2020  Hearing? N  Vision? N  Difficulty concentrating or making decisions? N  Walking or climbing stairs? N  Dressing or bathing? N  Doing errands, shopping? N  Preparing Food and eating ? N  Using the Toilet? N  In the past six months, have you accidently leaked urine? N  Do you have problems with loss of bowel control? N  Managing your Medications? N  Managing your Finances? N  Housekeeping or managing your Housekeeping? N  Some recent data might be hidden    Patient Care  Team: Binnie Rail, MD as PCP - General (Internal Medicine) Renato Shin, MD as Consulting Physician (Endocrinology) [name] Evener, [facility] as Referring Physician (Optometry)  Indicate any recent Medical Services you may have received from other than [facility] providers in the past year (date may be approximate).     Assessment:   This is a routine wellness examination for [place].  Hearing/Vision screen No exam data present  Dietary issues and exercise activities discussed: Current Exercise Habits: Home exercise routine;Structured exercise class, Type of exercise: walking;treadmill;Other - see comments (hiking club & water aerobics), Time (Minutes): 30, Frequency (Times/Week): 5, Weekly Exercise (Minutes/Week): 150, Intensity: Moderate, Exercise limited by: None  identified  Goals    . Patient Stated     I want to lose weight and maintain it by continuing to eat healthy and exercise.       Depression Screen PHQ 2/9 Scores 04/01/2020 03/25/2019 02/14/2019 12/18/2017  PHQ - 2 Score 0 0 0 0    Fall Risk Fall Risk  04/01/2020 03/25/2019 02/14/2019 12/18/2017  Falls in the past year? 0 0 0 No  Number falls in past yr: 0 0 0 -  Injury with Fall? 0 - - -  Risk for fall due to : No Fall Risks - - -  Follow up Falls evaluation completed - - -    Any stairs in or around the home? Yes  If so, are there any without handrails? No  Home free of loose throw rugs in walkways, pet beds, electrical cords, etc? Yes  Adequate lighting in your home to reduce risk of falls? Yes   ASSISTIVE DEVICES UTILIZED TO PREVENT FALLS:  Life alert? No  Use of a cane, walker or w/c? No  Grab bars in the bathroom? Yes  Shower chair or bench in shower? Yes  Elevated toilet seat or a handicapped toilet? Yes   TIMED UP AND GO:  Was the test performed? No .  Length of time to ambulate 10 feet: 0 sec.   Gait steady and fast without use of assistive device  Cognitive Function: Patient is cogitatively intact.          Immunizations Immunization History  Administered Date(s) Administered  . Fluad Quad(high Dose 65+) 05/17/2019  . Hepatitis B 08/03/1987, 12/27/1987  . Influenza,inj,Quad PF,6-35 Mos 04/02/2018  . Influenza-Unspecified 03/22/2015, 03/12/2017  . PFIZER SARS-COV-2 Vaccination 08/17/2019, 09/10/2019  . Td 06/26/2001  . Tdap 02/11/2015  . Zoster 04/11/2013    TDAP status: Up to date Flu Vaccine status: Up to date Pneumococcal vaccine status: Declined,  Education has been provided regarding the importance of this vaccine but patient still declined. Advised may receive this vaccine at local pharmacy or Health Dept. Aware to provide a copy of the vaccination record if obtained from local pharmacy or Health Dept. Verbalized acceptance and understanding.  Covid-19 vaccine status: Completed vaccines  Qualifies for Shingles Vaccine? Yes   Zostavax completed Yes   Shingrix Completed?: No.    Education has been provided regarding the importance of this vaccine. Patient has been advised to call insurance company to determine out of pocket expense if they have not yet received this vaccine. Advised may also receive vaccine at local pharmacy or Health Dept. Verbalized acceptance and understanding.  Screening Tests Health Maintenance  Topic Date Due  . PNA vac Low Risk Adult (1 of 2 - PCV13) Never done  . INFLUENZA VACCINE  01/25/2020  . DEXA SCAN  03/18/2021  . MAMMOGRAM  05/28/2021  . COLONOSCOPY  05/03/2024  . TETANUS/TDAP  08/15/2025  . COVID-19 Vaccine  Completed  . Hepatitis C Screening  Completed    Health Maintenance  Health Maintenance Due  Topic Date Due  . PNA vac Low Risk Adult (1 of 2 - PCV13) Never done  . INFLUENZA VACCINE  01/25/2020    Colorectal cancer screening: Completed 05/03/2017. Repeat every 7 years Mammogram status: Completed 06/30/2019. Repeat every year Bone Density status: Completed 03/18/2018. Results reflect: Bone density results: OSTEOPOROSIS. Repeat every 3  years.  Lung Cancer Screening: (Low Dose CT Chest recommended if Age 57-80 years, 30 pack-year currently smoking OR have quit w/in 15years.) does not qualify.  Lung Cancer Screening Referral: NO  Additional Screening:  Hepatitis C Screening: does qualify; Completed YES  Vision Screening: Recommended annual ophthalmology exams for early detection of glaucoma and other disorders of the eye. Is the patient up to date with their annual eye exam?  Yes  Who is the provider or what is the name of the office in which the patient attends annual eye exams? SUSHMITA DE-ALLEN, OD If pt is not established with a provider, would they like to be referred to a provider to establish care? No .   Dental Screening: Recommended annual dental exams for proper oral hygiene  Community Resource Referral / Chronic Care Management: CRR required this visit?  No   CCM required this visit?  No      Plan:     I have personally reviewed and noted the following in the patient's chart:   . Medical and social history . Use of alcohol, tobacco or illicit drugs  . Current medications and supplements . Functional ability and status . Nutritional status . Physical activity . Advanced directives . List of other physicians . Hospitalizations, surgeries, and ER visits in previous 12 months . Vitals . Screenings to include cognitive, depression, and falls . Referrals and appointments  In addition, I have reviewed and discussed with patient certain preventive protocols, quality metrics, and best practice recommendations. A written personalized care plan for preventive services as well as general preventive health recommendations were provided to patient.     Sheral Flow, LPN   75/06/256   Nurse Notes:  Patient is cogitatively intact. There were no vitals filed for this visit. There is no height or weight on file to calculate BMI. Patient stated that she has no issues with gait or balance; does not  use any assistive devices.

## 2020-04-01 NOTE — Patient Instructions (Addendum)
Angie Garcia , Thank you for taking time to come for your Medicare Wellness Visit. I appreciate your ongoing commitment to your health goals. Please review the following plan we discussed and let me know if I can assist you in the future.   Screening recommendations/referrals: Colonoscopy: 05/03/2017; due every 7 years Mammogram: 06/30/2019 Bone Density: 03/18/2018; due every 3 years Recommended yearly ophthalmology/optometry visit for glaucoma screening and checkup Recommended yearly dental visit for hygiene and checkup  Vaccinations: Influenza vaccine: 05/17/2019 Pneumococcal vaccine: never done Tdap vaccine: 818//2016; due every 10 years Shingles vaccine: never done   Covid-19: completed  Advanced directives: Advance directive discussed with you today. Even though you declined this today please call our office should you change your mind and we can give you the proper paperwork for you to fill out.  Conditions/risks identified: Yes; Reviewed health maintenance screenings with patient today and relevant education, vaccines, and/or referrals were provided. Please continue to do your personal lifestyle choices by: daily care of teeth and gums, regular physical activity (goal should be 5 days a week for 30 minutes), eat a healthy diet, avoid tobacco and drug use, limiting any alcohol intake, taking a low-dose aspirin (if not allergic or have been advised by your provider otherwise) and taking vitamins and minerals as recommended by your provider. Continue doing brain stimulating activities (puzzles, reading, adult coloring books, staying active) to keep memory sharp. Continue to eat heart healthy diet (full of fruits, vegetables, whole grains, lean protein, water--limit salt, fat, and sugar intake) and increase physical activity as tolerated.  Next appointment: Please schedule your next Medicare Wellness Visit with your Nurse Health Advisor in 1 year by calling [phone].   Preventive Care 67 Years  and Older, Female Preventive care refers to lifestyle choices and visits with your health care provider that can promote health and wellness. What does preventive care include?  A yearly physical exam. This is also called an annual well check.  Dental exams once or twice a year.  Routine eye exams. Ask your health care provider how often you should have your eyes checked.  Personal lifestyle choices, including:  Daily care of your teeth and gums.  Regular physical activity.  Eating a healthy diet.  Avoiding tobacco and drug use.  Limiting alcohol use.  Practicing safe sex.  Taking low-dose aspirin every day.  Taking vitamin and mineral supplements as recommended by your health care provider. What happens during an annual well check? The services and screenings done by your health care provider during your annual well check will depend on your age, overall health, lifestyle risk factors, and family history of disease. Counseling  Your health care provider may ask you questions about your:  Alcohol use.  Tobacco use.  Drug use.  Emotional well-being.  Home and relationship well-being.  Sexual activity.  Eating habits.  History of falls.  Memory and ability to understand (cognition).  Work and work Statistician.  Reproductive health. Screening  You may have the following tests or measurements:  Height, weight, and BMI.  Blood pressure.  Lipid and cholesterol levels. These may be checked every 5 years, or more frequently if you are over 64 years old.  Skin check.  Lung cancer screening. You may have this screening every year starting at age 38 if you have a 30-pack-year history of smoking and currently smoke or have quit within the past 15 years.  Fecal occult blood test (FOBT) of the stool. You may have this test every year starting  at age 47.  Flexible sigmoidoscopy or colonoscopy. You may have a sigmoidoscopy every 5 years or a colonoscopy every 10  years starting at age 32.  Hepatitis C blood test.  Hepatitis B blood test.  Sexually transmitted disease (STD) testing.  Diabetes screening. This is done by checking your blood sugar (glucose) after you have not eaten for a while (fasting). You may have this done every 1-3 years.  Bone density scan. This is done to screen for osteoporosis. You may have this done starting at age 16.  Mammogram. This may be done every 1-2 years. Talk to your health care provider about how often you should have regular mammograms. Talk with your health care provider about your test results, treatment options, and if necessary, the need for more tests. Vaccines  Your health care provider may recommend certain vaccines, such as:  Influenza vaccine. This is recommended every year.  Tetanus, diphtheria, and acellular pertussis (Tdap, Td) vaccine. You may need a Td booster every 10 years.  Zoster vaccine. You may need this after age 44.  Pneumococcal 13-valent conjugate (PCV13) vaccine. One dose is recommended after age 64.  Pneumococcal polysaccharide (PPSV23) vaccine. One dose is recommended after age 25. Talk to your health care provider about which screenings and vaccines you need and how often you need them. This information is not intended to replace advice given to you by your health care provider. Make sure you discuss any questions you have with your health care provider. Document Released: 07/09/2015 Document Revised: 03/01/2016 Document Reviewed: 04/13/2015 Elsevier Interactive Patient Education  2017 [employer] Prevention in the Home Falls can cause injuries. They can happen to people of all ages. There are many things you can do to make your home safe and to help prevent falls. What can I do on the outside of my home?  Regularly fix the edges of walkways and driveways and fix any cracks.  Remove anything that might make you trip as you walk through a door, such as a raised step or  threshold.  Trim any bushes or trees on the path to your home.  Use bright outdoor lighting.  Clear any walking paths of anything that might make someone trip, such as rocks or tools.  Regularly check to see if handrails are loose or broken. Make sure that both sides of any steps have handrails.  Any raised decks and porches should have guardrails on the edges.  Have any leaves, snow, or ice cleared regularly.  Use sand or salt on walking paths during winter.  Clean up any spills in your garage right away. This includes oil or grease spills. What can I do in the bathroom?  Use night lights.  Install grab bars by the toilet and in the tub and shower. Do not use towel bars as grab bars.  Use non-skid mats or decals in the tub or shower.  If you need to sit down in the shower, use a plastic, non-slip stool.  Keep the floor dry. Clean up any water that spills on the floor as soon as it happens.  Remove soap buildup in the tub or shower regularly.  Attach bath mats securely with double-sided non-slip rug tape.  Do not have throw rugs and other things on the floor that can make you trip. What can I do in the bedroom?  Use night lights.  Make sure that you have a light by your bed that is easy to reach.  Do not use  any sheets or blankets that are too big for your bed. They should not hang down onto the floor.  Have a firm chair that has side arms. You can use this for support while you get dressed.  Do not have throw rugs and other things on the floor that can make you trip. What can I do in the kitchen?  Clean up any spills right away.  Avoid walking on wet floors.  Keep items that you use a lot in easy-to-reach places.  If you need to reach something above you, use a strong step stool that has a grab bar.  Keep electrical cords out of the way.  Do not use floor polish or wax that makes floors slippery. If you must use wax, use non-skid floor wax.  Do not have  throw rugs and other things on the floor that can make you trip. What can I do with my stairs?  Do not leave any items on the stairs.  Make sure that there are handrails on both sides of the stairs and use them. Fix handrails that are broken or loose. Make sure that handrails are as long as the stairways.  Check any carpeting to make sure that it is firmly attached to the stairs. Fix any carpet that is loose or worn.  Avoid having throw rugs at the top or bottom of the stairs. If you do have throw rugs, attach them to the floor with carpet tape.  Make sure that you have a light switch at the top of the stairs and the bottom of the stairs. If you do not have them, ask someone to add them for you. What else can I do to help prevent falls?  Wear shoes that:  Do not have high heels.  Have rubber bottoms.  Are comfortable and fit you well.  Are closed at the toe. Do not wear sandals.  If you use a stepladder:  Make sure that it is fully opened. Do not climb a closed stepladder.  Make sure that both sides of the stepladder are locked into place.  Ask someone to hold it for you, if possible.  Clearly mark and make sure that you can see:  Any grab bars or handrails.  First and last steps.  Where the edge of each step is.  Use tools that help you move around (mobility aids) if they are needed. These include:  Canes.  Walkers.  Scooters.  Crutches.  Turn on the lights when you go into a dark area. Replace any light bulbs as soon as they burn out.  Set up your furniture so you have a clear path. Avoid moving your furniture around.  If any of your floors are uneven, fix them.  If there are any pets around you, be aware of where they are.  Review your medicines with your doctor. Some medicines can make you feel dizzy. This can increase your chance of falling. Ask your doctor what other things that you can do to help prevent falls. This information is not intended to  replace advice given to you by your health care provider. Make sure you discuss any questions you have with your health care provider. Document Released: 04/08/2009 Document Revised: 11/18/2015 Document Reviewed: 07/17/2014 Elsevier Interactive Patient Education  2017 [employer].

## 2020-04-07 NOTE — Patient Instructions (Addendum)
Blood work was ordered.    All other Health Maintenance issues reviewed.   All recommended immunizations and age-appropriate screenings are up-to-date or discussed.  Flu immunization administered today.    Medications reviewed and updated.  Changes include :   none    Please followup in 1 year    Health Maintenance, Female Adopting a healthy lifestyle and getting preventive care are important in promoting health and wellness. Ask your health care provider about:  The right schedule for you to have regular tests and exams.  Things you can do on your own to prevent diseases and keep yourself healthy. What should I know about diet, weight, and exercise? Eat a healthy diet   Eat a diet that includes plenty of vegetables, fruits, low-fat dairy products, and lean protein.  Do not eat a lot of foods that are high in solid fats, added sugars, or sodium. Maintain a healthy weight Body mass index (BMI) is used to identify weight problems. It estimates body fat based on height and weight. Your health care provider can help determine your BMI and help you achieve or maintain a healthy weight. Get regular exercise Get regular exercise. This is one of the most important things you can do for your health. Most adults should:  Exercise for at least 150 minutes each week. The exercise should increase your heart rate and make you sweat (moderate-intensity exercise).  Do strengthening exercises at least twice a week. This is in addition to the moderate-intensity exercise.  Spend less time sitting. Even light physical activity can be beneficial. Watch cholesterol and blood lipids Have your blood tested for lipids and cholesterol at 67 years of age, then have this test every 5 years. Have your cholesterol levels checked more often if:  Your lipid or cholesterol levels are high.  You are older than 67 years of age.  You are at high risk for heart disease. What should I know about cancer  screening? Depending on your health history and family history, you may need to have cancer screening at various ages. This may include screening for:  Breast cancer.  Cervical cancer.  Colorectal cancer.  Skin cancer.  Lung cancer. What should I know about heart disease, diabetes, and high blood pressure? Blood pressure and heart disease  High blood pressure causes heart disease and increases the risk of stroke. This is more likely to develop in people who have high blood pressure readings, are of African descent, or are overweight.  Have your blood pressure checked: ? Every 3-5 years if you are 18-39 years of age. ? Every year if you are 40 years old or older. Diabetes Have regular diabetes screenings. This checks your fasting blood sugar level. Have the screening done:  Once every three years after age 40 if you are at a normal weight and have a low risk for diabetes.  More often and at a younger age if you are overweight or have a high risk for diabetes. What should I know about preventing infection? Hepatitis B If you have a higher risk for hepatitis B, you should be screened for this virus. Talk with your health care provider to find out if you are at risk for hepatitis B infection. Hepatitis C Testing is recommended for:  Everyone born from 1945 through 1965.  Anyone with known risk factors for hepatitis C. Sexually transmitted infections (STIs)  Get screened for STIs, including gonorrhea and chlamydia, if: ? You are sexually active and are younger than 67 years   of age. ? You are older than 67 years of age and your health care provider tells you that you are at risk for this type of infection. ? Your sexual activity has changed since you were last screened, and you are at increased risk for chlamydia or gonorrhea. Ask your health care provider if you are at risk.  Ask your health care provider about whether you are at high risk for HIV. Your health care provider may  recommend a prescription medicine to help prevent HIV infection. If you choose to take medicine to prevent HIV, you should first get tested for HIV. You should then be tested every 3 months for as long as you are taking the medicine. Pregnancy  If you are about to stop having your period (premenopausal) and you may become pregnant, seek counseling before you get pregnant.  Take 400 to 800 micrograms (mcg) of folic acid every day if you become pregnant.  Ask for birth control (contraception) if you want to prevent pregnancy. Osteoporosis and menopause Osteoporosis is a disease in which the bones lose minerals and strength with aging. This can result in bone fractures. If you are 65 years old or older, or if you are at risk for osteoporosis and fractures, ask your health care provider if you should:  Be screened for bone loss.  Take a calcium or vitamin D supplement to lower your risk of fractures.  Be given hormone replacement therapy (HRT) to treat symptoms of menopause. Follow these instructions at home: Lifestyle  Do not use any products that contain nicotine or tobacco, such as cigarettes, e-cigarettes, and chewing tobacco. If you need help quitting, ask your health care provider.  Do not use street drugs.  Do not share needles.  Ask your health care provider for help if you need support or information about quitting drugs. Alcohol use  Do not drink alcohol if: ? Your health care provider tells you not to drink. ? You are pregnant, may be pregnant, or are planning to become pregnant.  If you drink alcohol: ? Limit how much you use to 0-1 drink a day. ? Limit intake if you are breastfeeding.  Be aware of how much alcohol is in your drink. In the [place] one drink equals one 12 oz bottle of beer (355 mL), one 5 oz glass of wine (148 mL), or one 1 oz glass of hard liquor (44 mL). General instructions  Schedule regular health, dental, and eye exams.  Stay current with your  vaccines.  Tell your health care provider if: ? You often feel depressed. ? You have ever been abused or do not feel safe at home. Summary  Adopting a healthy lifestyle and getting preventive care are important in promoting health and wellness.  Follow your health care provider's instructions about healthy diet, exercising, and getting tested or screened for diseases.  Follow your health care provider's instructions on monitoring your cholesterol and blood pressure. This information is not intended to replace advice given to you by your health care provider. Make sure you discuss any questions you have with your health care provider. Document Revised: 06/05/2018 Document Reviewed: 06/05/2018 Elsevier Patient Education  2020 Elsevier Inc.  

## 2020-04-07 NOTE — Progress Notes (Signed)
Subjective:    Patient ID: Angie Garcia, female    DOB: 06-Mar-1951, 69 y.o.   MRN: [id number]   This visit occurred during the SARS-CoV-2 public health emergency.  Safety protocols were in place, including screening questions prior to the visit, additional usage of staff PPE, and extensive cleaning of exam room while observing appropriate contact time as indicated for disinfecting solutions.    HPI She is here for a physical exam.   She usually takes her BP meds at 9 when she eats.  She did not take them this morning.  Overall she feels well and has no concerns.  Medications and allergies reviewed with patient and updated if appropriate.  Patient Active Problem List   Diagnosis Date Noted  . Hyperparathyroidism, primary ([place]) 03/14/2018  . Multinodular goiter 03/14/2018  . Hx of adenomatous polyp of colon 05/09/2017  . Cyst of left kidney 10/21/2015  . Prediabetes 08/19/2015  . Hyperlipidemia 06/02/2014  . DJD (degenerative joint disease) of knee 04/01/2013  . Hemorrhoids, internal, with bleeding 07/04/2011  . LACTOSE INTOLERANCE 11/18/2009  . RHINITIS 11/18/2009  . History of herpes zoster 10/28/2008  . Essential hypertension 01/24/2008    Current Outpatient Medications on File Prior to Visit  Medication Sig Dispense Refill  . albuterol (PROVENTIL HFA;VENTOLIN HFA) 108 (90 Base) MCG/ACT inhaler Inhale 2 puffs into the lungs every 4 (four) hours as needed for wheezing or shortness of breath. 1 Inhaler 0  . cetirizine (ZYRTEC) 10 MG tablet Take 1 tablet (10 mg total) by mouth daily. 30 tablet 0  . diltiazem (CARDIZEM CD) 120 MG 24 hr capsule TAKE ONE CAPSULE BY MOUTH ONE TIME DAILY 90 capsule 3  . lisinopril (ZESTRIL) 20 MG tablet TAKE ONE TABLET BY MOUTH ONE TIME DAILY 90 tablet 3  . Multiple Vitamin (MULTIVITAMIN) capsule Take 1 capsule by mouth daily.    . naproxen sodium (ALEVE) 220 MG tablet Take 220 mg by mouth.    Marland Kitchen OVER THE COUNTER MEDICATION Tumeric one capsule  a day.    . TURMERIC PO Take by mouth.    . latanoprost (XALATAN) 0.005 % ophthalmic solution 1 drop at bedtime.    . RHOPRESSA 0.02 % SOLN Apply 1 drop to eye at bedtime.     No current facility-administered medications on file prior to visit.    Past Medical History:  Diagnosis Date  . Allergy   . Arthritis   . Baker's cyst    Left knee  . Hx of adenomatous polyp of colon 05/09/2017  . Hypertension   . Lactose intolerance   . Shingles 10/2008   RUE    Past Surgical History:  Procedure Laterality Date  . ABDOMINAL HYSTERECTOMY     Dr Connye Burkitt for fibroids  . COLONOSCOPY  12/11/2005   lymphoid aggregate removed (suspected polyp was not), melanosis coli  . PILONIDAL CYSTECTOMY     spine cyst    Social History   Socioeconomic History  . Marital status: Married    Spouse name: Darnell Level  . Number of children: 1  . Years of education: Not on file  . Highest education level: Not on file  Occupational History  . Occupation: retired    Fish farm manager: [employer]  Tobacco Use  . Smoking status: Former Smoker    Quit date: 06/27/1995    Years since quitting: 24.8  . Smokeless tobacco: Never Used  . Tobacco comment: smoked [age], up to < 1 ppd  Vaping Use  . Vaping Use:  Never used  Substance and Sexual Activity  . Alcohol use: No  . Drug use: No  . Sexual activity: Yes  Other Topics Concern  . Not on file  Social History Narrative   REGULAR EXERCISE   Social Determinants of Health   Financial Resource Strain: Low Risk   . Difficulty of Paying Living Expenses: Not hard at all  Food Insecurity: No Food Insecurity  . Worried About Charity fundraiser in the Last Year: Never true  . Ran Out of Food in the Last Year: Never true  Transportation Needs: No Transportation Needs  . Lack of Transportation (Medical): No  . Lack of Transportation (Non-Medical): No  Physical Activity: Sufficiently Active  . Days of Exercise per Week: 5 days  . Minutes of Exercise per Session: 30  min  Stress: No Stress Concern Present  . Feeling of Stress : Not at all  Social Connections: Socially Integrated  . Frequency of Communication with Friends and Family: More than three times a week  . Frequency of Social Gatherings with Friends and Family: More than three times a week  . Attends Religious Services: More than 4 times per year  . Active Member of Clubs or Organizations: Yes  . Attends Archivist Meetings: More than 4 times per year  . Marital Status: Married    Family History  Problem Relation Age of Onset  . Hypertension Mother   . Breast cancer Mother        24s  . Diabetes Father   . Stroke Father        MINI STROKES  . Stroke Sister 12  . Diabetes Sister   . Cancer Maternal Uncle        UNKNOWN TYPE  . Colon cancer Neg Hx   . Esophageal cancer Neg Hx   . Pancreatic cancer Neg Hx   . Rectal cancer Neg Hx   . Stomach cancer Neg Hx   . Hyperparathyroidism Neg Hx     Review of Systems  Constitutional: Negative for chills and fever.  Eyes: Positive for visual disturbance (up to date weith eye doctor).  Respiratory: Negative for cough, shortness of breath and wheezing.   Cardiovascular: Negative for chest pain, palpitations and leg swelling.  Gastrointestinal: Negative for abdominal pain, blood in stool, constipation, diarrhea and nausea.       No gerd  Genitourinary: Negative for dysuria and hematuria.  Musculoskeletal: Positive for arthralgias (r shoulder, r thigh, knees).  Skin: Negative for color change and rash.  Neurological: Negative for dizziness, light-headedness and headaches.  Psychiatric/Behavioral: Negative for dysphoric mood. The patient is not nervous/anxious.        Objective:   Vitals:   04/08/20 1004  BP: 140/88  Pulse: 62  Temp: 98.2 F (36.8 C)  SpO2: 97%   Filed Weights   04/08/20 1004  Weight: 178 lb (80.7 kg)   Body mass index is 29.62 kg/m.  BP Readings from Last 3 Encounters:  04/08/20 140/88  03/25/19  (!) 144/82  02/14/19 111/68    Wt Readings from Last 3 Encounters:  04/08/20 178 lb (80.7 kg)  03/25/19 178 lb (80.7 kg)  02/14/19 176 lb (79.8 kg)     Physical Exam Constitutional: She appears well-developed and well-nourished. No distress.  HENT:  Head: Normocephalic and atraumatic.  Right Ear: External ear normal. Normal ear canal and TM Left Ear: External ear normal.  Normal ear canal and TM Mouth/Throat: Oropharynx is clear and moist.  Eyes:  Conjunctivae and EOM are normal.  Neck: Neck supple. No tracheal deviation present. thyromegaly present.  No carotid bruit  Cardiovascular: Normal rate, regular rhythm and normal heart sounds.   No murmur heard.  No edema. Pulmonary/Chest: Effort normal and breath sounds normal. No respiratory distress. She has no wheezes. She has no rales.  Breast: deferred   Abdominal: Soft. She exhibits no distension. There is no tenderness.  Lymphadenopathy: She has no cervical adenopathy.  Skin: Skin is warm and dry. She is not diaphoretic.  Psychiatric: She has a normal mood and affect. Her behavior is normal.        Assessment & Plan:   Physical exam: Screening blood work    ordered Immunizations  Flu vaccine today, prevnar advised, discussed shingrix and covid booster Colonoscopy  Up to date  Mammogram  Up to date  Gyn  n/a Dexa  Up to date  Eye exams  Up to date  Exercise  Walking, water aerobics Weight  overweight.  Encouraged weight loss Substance abuse  none    Screened for depression using the PHQ 2 scale.  No evidence of depression.     See Problem List for Assessment and Plan of chronic medical problems.

## 2020-04-08 ENCOUNTER — Encounter: Payer: Self-pay | Admitting: Internal Medicine

## 2020-04-08 ENCOUNTER — Other Ambulatory Visit: Payer: Self-pay

## 2020-04-08 ENCOUNTER — Ambulatory Visit (INDEPENDENT_AMBULATORY_CARE_PROVIDER_SITE_OTHER): Payer: Medicare HMO | Admitting: Internal Medicine

## 2020-04-08 VITALS — BP 140/88 | HR 62 | Temp 98.2°F | Ht 65.0 in | Wt 178.0 lb

## 2020-04-08 DIAGNOSIS — E782 Mixed hyperlipidemia: Secondary | ICD-10-CM

## 2020-04-08 DIAGNOSIS — H4020X1 Unspecified primary angle-closure glaucoma, mild stage: Secondary | ICD-10-CM | POA: Diagnosis not present

## 2020-04-08 DIAGNOSIS — Z23 Encounter for immunization: Secondary | ICD-10-CM | POA: Diagnosis not present

## 2020-04-08 DIAGNOSIS — R7303 Prediabetes: Secondary | ICD-10-CM | POA: Diagnosis not present

## 2020-04-08 DIAGNOSIS — H409 Unspecified glaucoma: Secondary | ICD-10-CM | POA: Insufficient documentation

## 2020-04-08 DIAGNOSIS — E042 Nontoxic multinodular goiter: Secondary | ICD-10-CM

## 2020-04-08 DIAGNOSIS — I10 Essential (primary) hypertension: Secondary | ICD-10-CM | POA: Diagnosis not present

## 2020-04-08 DIAGNOSIS — Z0000 Encounter for general adult medical examination without abnormal findings: Secondary | ICD-10-CM | POA: Diagnosis not present

## 2020-04-08 LAB — LIPID PANEL
Cholesterol: 255 mg/dL — ABNORMAL HIGH (ref 0–200)
HDL: 78.4 mg/dL
LDL Cholesterol: 165 mg/dL — ABNORMAL HIGH (ref 0–99)
NonHDL: 176.28
Total CHOL/HDL Ratio: 3
Triglycerides: 55 mg/dL (ref 0.0–149.0)
VLDL: 11 mg/dL (ref 0.0–40.0)

## 2020-04-08 LAB — COMPREHENSIVE METABOLIC PANEL WITH GFR
ALT: 12 U/L (ref 0–35)
AST: 18 U/L (ref 0–37)
Albumin: 4.1 g/dL (ref 3.5–5.2)
Alkaline Phosphatase: 76 U/L (ref 39–117)
BUN: 19 mg/dL (ref 6–23)
CO2: 26 meq/L (ref 19–32)
Calcium: 10.9 mg/dL — ABNORMAL HIGH (ref 8.4–10.5)
Chloride: 107 meq/L (ref 96–112)
Creatinine, Ser: 0.87 mg/dL (ref 0.40–1.20)
GFR: 68.78 mL/min
Glucose, Bld: 95 mg/dL (ref 70–99)
Potassium: 4.2 meq/L (ref 3.5–5.1)
Sodium: 140 meq/L (ref 135–145)
Total Bilirubin: 0.4 mg/dL (ref 0.2–1.2)
Total Protein: 7 g/dL (ref 6.0–8.3)

## 2020-04-08 LAB — CBC WITH DIFFERENTIAL/PLATELET
Basophils Absolute: 0.1 K/uL (ref 0.0–0.1)
Basophils Relative: 1.1 % (ref 0.0–3.0)
Eosinophils Absolute: 0.1 K/uL (ref 0.0–0.7)
Eosinophils Relative: 2.4 % (ref 0.0–5.0)
HCT: 38.5 % (ref 36.0–46.0)
Hemoglobin: 12.6 g/dL (ref 12.0–15.0)
Lymphocytes Relative: 34.8 % (ref 12.0–46.0)
Lymphs Abs: 2 K/uL (ref 0.7–4.0)
MCHC: 32.6 g/dL (ref 30.0–36.0)
MCV: 85.9 fl (ref 78.0–100.0)
Monocytes Absolute: 0.4 K/uL (ref 0.1–1.0)
Monocytes Relative: 7.8 % (ref 3.0–12.0)
Neutro Abs: 3 K/uL (ref 1.4–7.7)
Neutrophils Relative %: 53.9 % (ref 43.0–77.0)
Platelets: 326 K/uL (ref 150.0–400.0)
RBC: 4.48 Mil/uL (ref 3.87–5.11)
RDW: 13.4 % (ref 11.5–15.5)
WBC: 5.6 K/uL (ref 4.0–10.5)

## 2020-04-08 LAB — HEMOGLOBIN A1C: Hgb A1c MFr Bld: 5.9 % (ref 4.6–6.5)

## 2020-04-08 LAB — TSH: TSH: 0.6 u[IU]/mL (ref 0.35–4.50)

## 2020-04-08 NOTE — Addendum Note (Signed)
Addended by: Marcina Millard on: 04/08/2020 03:04 PM   Modules accepted: Orders

## 2020-04-08 NOTE — Assessment & Plan Note (Signed)
Chronic Check a1c Low sugar / carb diet Stressed regular exercise  

## 2020-04-08 NOTE — Assessment & Plan Note (Signed)
Chronic Check lipid panel, CMP Stressed regular exercise and healthy diet Will likely be eligible for a statin-we will await blood work results

## 2020-04-08 NOTE — Assessment & Plan Note (Signed)
Chronic Borderline high today - ? Controlled She is 1.5 hrs later on her bp meds since she usually takes them when she eats and did not eat this morning. Encouraged her to monitor at home -discussed goal  Low sodium diet, regular exercise and weight loss stressed Continue diltiazem 120 mg daily, lisinopril 20 mg daily cmp

## 2020-04-08 NOTE — Assessment & Plan Note (Signed)
Chronic She denies any changes in her thyroid No concerning nodules felt on exam TSH

## 2020-04-12 ENCOUNTER — Telehealth: Payer: Self-pay | Admitting: Internal Medicine

## 2020-04-12 DIAGNOSIS — E782 Mixed hyperlipidemia: Secondary | ICD-10-CM

## 2020-04-12 NOTE — Telephone Encounter (Signed)
Patient wondering if Dr. Quay Burow would be willing to put in labs for her to get her cholesterol checked in February.

## 2020-04-13 NOTE — Telephone Encounter (Signed)
Ordered for [place] 

## 2020-04-14 NOTE — Telephone Encounter (Signed)
Spoke with patient today. 

## 2020-05-04 DIAGNOSIS — H04123 Dry eye syndrome of bilateral lacrimal glands: Secondary | ICD-10-CM | POA: Diagnosis not present

## 2020-07-23 ENCOUNTER — Telehealth: Payer: Self-pay | Admitting: Internal Medicine

## 2020-07-23 NOTE — Progress Notes (Signed)
  Chronic Care Management   Outreach Note  07/23/2020 Name: JOZEE HAMMER MRN: [id number] DOB: 1950/07/04  Referred by: Binnie Rail, MD Reason for referral : No chief complaint on file.   An unsuccessful telephone outreach was attempted today. The patient was referred to the pharmacist for assistance with care management and care coordination.   Follow Up Plan:   Carley Perdue [employer] Scheduler

## 2020-07-26 ENCOUNTER — Other Ambulatory Visit: Payer: Self-pay

## 2020-07-26 ENCOUNTER — Other Ambulatory Visit (INDEPENDENT_AMBULATORY_CARE_PROVIDER_SITE_OTHER): Payer: Medicare HMO

## 2020-07-26 DIAGNOSIS — E782 Mixed hyperlipidemia: Secondary | ICD-10-CM | POA: Diagnosis not present

## 2020-07-26 LAB — LIPID PANEL
Cholesterol: 249 mg/dL — ABNORMAL HIGH (ref 0–200)
HDL: 73.1 mg/dL (ref >39.00)
LDL Cholesterol: 154 mg/dL — ABNORMAL HIGH (ref 0–99)
NonHDL: 175.64
Total CHOL/HDL Ratio: 3
Triglycerides: 106 mg/dL (ref 0.0–149.0)
VLDL: 21.2 mg/dL (ref 0.0–40.0)

## 2020-07-27 ENCOUNTER — Other Ambulatory Visit (HOSPITAL_BASED_OUTPATIENT_CLINIC_OR_DEPARTMENT_OTHER): Payer: Self-pay | Admitting: Internal Medicine

## 2020-07-27 DIAGNOSIS — Z1231 Encounter for screening mammogram for malignant neoplasm of breast: Secondary | ICD-10-CM

## 2020-07-29 ENCOUNTER — Other Ambulatory Visit: Payer: Self-pay

## 2020-07-29 ENCOUNTER — Ambulatory Visit (HOSPITAL_BASED_OUTPATIENT_CLINIC_OR_DEPARTMENT_OTHER)
Admission: RE | Admit: 2020-07-29 | Discharge: 2020-07-29 | Disposition: A | Discharge status: Home / Self Care | Payer: Medicare HMO | Source: Ambulatory Visit | Attending: Internal Medicine | Admitting: Internal Medicine

## 2020-07-29 DIAGNOSIS — Z1231 Encounter for screening mammogram for malignant neoplasm of breast: Secondary | ICD-10-CM | POA: Diagnosis not present

## 2020-07-30 ENCOUNTER — Other Ambulatory Visit: Payer: Self-pay | Admitting: Internal Medicine

## 2020-07-30 DIAGNOSIS — R928 Other abnormal and inconclusive findings on diagnostic imaging of breast: Secondary | ICD-10-CM

## 2020-08-13 ENCOUNTER — Telehealth: Payer: Self-pay | Admitting: Internal Medicine

## 2020-08-13 NOTE — Progress Notes (Signed)
  Chronic Care Management   Outreach Note  08/13/2020 Name: Angie Garcia MRN: [id number] DOB: [birth year]  Referred by: Binnie Rail, MD Reason for referral : No chief complaint on file.   A second unsuccessful telephone outreach was attempted today. The patient was referred to pharmacist for assistance with care management and care coordination.  Follow Up Plan:   Carley Perdue [employer] Scheduler

## 2020-08-13 NOTE — Progress Notes (Signed)
  Chronic Care Management   Note  08/13/2020 Name: LYNCOLN MASKELL MRN: [id number] DOB: 1950-07-19  [name] is a 70 y.o. year old female who is a primary care patient of Burns, Claudina Lick, MD. I reached out to Caroleen Hamman by phone today in response to a referral sent by Ms. Ida Rogue Ask's PCP, Binnie Rail, MD.   Ms. Mally was given information about Chronic Care Management services today including:  1. CCM service includes personalized support from designated clinical staff supervised by her physician, including individualized plan of care and coordination with other care providers 2. 24/7 contact phone numbers for assistance for urgent and routine care needs. 3. Service will only be billed when office clinical staff spend 20 minutes or more in a month to coordinate care. 4. Only one practitioner may furnish and bill the service in a calendar month. 5. The patient may stop CCM services at any time (effective at the end of the month) by phone call to the office staff.   Patient wishes to consider information provided and/or speak with a member of the care team before deciding about enrollment in care management services.   Follow up plan:   Carley Perdue [employer] Scheduler

## 2020-08-18 ENCOUNTER — Ambulatory Visit
Admission: RE | Admit: 2020-08-18 | Discharge: 2020-08-18 | Disposition: A | Discharge status: Home / Self Care | Payer: Medicare HMO | Source: Ambulatory Visit | Attending: Internal Medicine | Admitting: Internal Medicine

## 2020-08-18 ENCOUNTER — Other Ambulatory Visit: Payer: Self-pay

## 2020-08-18 DIAGNOSIS — R928 Other abnormal and inconclusive findings on diagnostic imaging of breast: Secondary | ICD-10-CM

## 2020-08-18 DIAGNOSIS — N6002 Solitary cyst of left breast: Secondary | ICD-10-CM | POA: Diagnosis not present

## 2020-08-18 DIAGNOSIS — N6012 Diffuse cystic mastopathy of left breast: Secondary | ICD-10-CM | POA: Diagnosis not present

## 2020-08-18 DIAGNOSIS — R922 Inconclusive mammogram: Secondary | ICD-10-CM | POA: Diagnosis not present

## 2020-08-19 ENCOUNTER — Encounter: Payer: Self-pay | Admitting: Internal Medicine

## 2020-10-21 ENCOUNTER — Telehealth: Payer: Self-pay | Admitting: Internal Medicine

## 2020-10-21 NOTE — Progress Notes (Signed)
  Chronic Care Management   Note  10/21/2020 Name: Angie Garcia MRN: [id number] DOB: 1951/06/15  Angie Garcia is a 70 y.o. year old female who is a primary care patient of Burns, Claudina Lick, MD. I reached out to Caroleen Hamman by phone today in response to a referral sent by Ms. Ida Rogue Newlon's PCP, Binnie Rail, MD.   Ms. Louischarles was given information about Chronic Care Management services today including:  1. CCM service includes personalized support from designated clinical staff supervised by her physician, including individualized plan of care and coordination with other care providers 2. 24/7 contact phone numbers for assistance for urgent and routine care needs. 3. Service will only be billed when office clinical staff spend 20 minutes or more in a month to coordinate care. 4. Only one practitioner may furnish and bill the service in a calendar month. 5. The patient may stop CCM services at any time (effective at the end of the month) by phone call to the office staff.   Patient agreed to services and verbal consent obtained.   Follow up plan:   Carley Perdue [employer] Scheduler

## 2020-10-22 ENCOUNTER — Encounter: Payer: Self-pay | Admitting: Internal Medicine

## 2020-11-01 ENCOUNTER — Encounter (HOSPITAL_COMMUNITY): Payer: Self-pay

## 2020-11-01 ENCOUNTER — Ambulatory Visit (HOSPITAL_COMMUNITY)
Admission: EM | Admit: 2020-11-01 | Discharge: 2020-11-01 | Disposition: A | Discharge status: Home / Self Care | Payer: Medicare HMO | Attending: Internal Medicine | Admitting: Internal Medicine

## 2020-11-01 ENCOUNTER — Other Ambulatory Visit: Payer: Self-pay

## 2020-11-01 DIAGNOSIS — M67442 Ganglion, left hand: Secondary | ICD-10-CM | POA: Diagnosis not present

## 2020-11-01 NOTE — Discharge Instructions (Signed)
Please call the doctor's office to make an appointment.

## 2020-11-01 NOTE — ED Triage Notes (Signed)
Pt c/o left hand and thumb pain.  She states she thinks it is gout and states it is not painful.

## 2020-11-03 NOTE — ED Provider Notes (Signed)
[facility]    CSN: [id number] Arrival date & time: 11/01/20  1030      History   Chief Complaint Chief Complaint  Patient presents with  . Hand Pain    Lt thumb    HPI Angie Garcia is a 70 y.o. female comes to the urgent care with firm swelling over the left thumb.  Symptoms have been present for some time now.  It is not tender.  No aggravating or relieving factors.  Patient has no history of gout.  No history of toe pain or swelling.  No fever or chills.  Patient denies any trauma.   HPI  Past Medical History:  Diagnosis Date  . Allergy   . Arthritis   . Baker's cyst    Left knee  . Hx of adenomatous polyp of colon 05/09/2017  . Hypertension   . Lactose intolerance   . Shingles 10/2008   RUE    Patient Active Problem List   Diagnosis Date Noted  . Glaucoma 04/08/2020  . Hyperparathyroidism, primary ([place]) 03/14/2018  . Multinodular goiter 03/14/2018  . Hx of adenomatous polyp of colon 05/09/2017  . Cyst of left kidney 10/21/2015  . Prediabetes 08/19/2015  . Hyperlipidemia 06/02/2014  . DJD (degenerative joint disease) of knee 04/01/2013  . Hemorrhoids, internal, with bleeding 07/04/2011  . LACTOSE INTOLERANCE 11/18/2009  . RHINITIS 11/18/2009  . History of herpes zoster 10/28/2008  . Essential hypertension 01/24/2008    Past Surgical History:  Procedure Laterality Date  . ABDOMINAL HYSTERECTOMY     Dr Connye Burkitt for fibroids  . COLONOSCOPY  12/11/2005   lymphoid aggregate removed (suspected polyp was not), melanosis coli  . PILONIDAL CYSTECTOMY     spine cyst    OB History   No obstetric history on file.      Home Medications    Prior to Admission medications   Medication Sig Start Date End Date Taking? Authorizing Provider  albuterol (PROVENTIL HFA;VENTOLIN HFA) 108 (90 Base) MCG/ACT inhaler Inhale 2 puffs into the lungs every 4 (four) hours as needed for wheezing or shortness of breath. 09/19/16   Janne Napoleon, NP  cetirizine (ZYRTEC) 10  MG tablet Take 1 tablet (10 mg total) by mouth daily. 08/28/17   Zigmund Gottron, NP  diltiazem (CARDIZEM CD) 120 MG 24 hr capsule TAKE ONE CAPSULE BY MOUTH ONE TIME DAILY 12/15/19   Binnie Rail, MD  latanoprost (XALATAN) 0.005 % ophthalmic solution 1 drop at bedtime. 03/04/20   [provider]  lisinopril (ZESTRIL) 20 MG tablet TAKE ONE TABLET BY MOUTH ONE TIME DAILY 12/15/19   Binnie Rail, MD  Multiple Vitamin (MULTIVITAMIN) capsule Take 1 capsule by mouth daily.    [provider]  naproxen sodium (ALEVE) 220 MG tablet Take 220 mg by mouth.    [provider]  RHOPRESSA 0.02 % SOLN Apply 1 drop to eye at bedtime. 03/16/20   [provider]  TURMERIC PO Take by mouth.    [provider]    Family History Family History  Problem Relation Age of Onset  . Hypertension Mother   . Breast cancer Mother        93s  . Diabetes Father   . Stroke Father        MINI STROKES  . Stroke Sister 5  . Diabetes Sister   . Cancer Maternal Uncle        UNKNOWN TYPE  . Colon cancer Neg Hx   .  Esophageal cancer Neg Hx   . Pancreatic cancer Neg Hx   . Rectal cancer Neg Hx   . Stomach cancer Neg Hx   . Hyperparathyroidism Neg Hx     Social History Social History   Tobacco Use  . Smoking status: Former Smoker    Quit date: 06/27/1995    Years since quitting: 25.3  . Smokeless tobacco: Never Used  . Tobacco comment: smoked [age], up to < 1 ppd  Vaping Use  . Vaping Use: Never used  Substance Use Topics  . Alcohol use: No  . Drug use: No     Allergies   Sulfamethoxazole-trimethoprim   Review of Systems Review of Systems  Genitourinary: Negative.   Musculoskeletal: Negative.   Neurological: Negative.      Physical Exam Triage Vital Signs ED Triage Vitals  Enc Vitals Group     BP 11/01/20 1155 (!) 154/80     Pulse Rate 11/01/20 1155 (!) 106     Resp 11/01/20 1155 17     Temp 11/01/20 1155 97.8 F (36.6 C)     Temp Source 11/01/20  1155 Oral     SpO2 11/01/20 1155 100 %     Weight --      Height --      Head Circumference --      Peak Flow --      Pain Score 11/01/20 1154 0     Pain Loc --      Pain Edu? --      Excl. in [place]? --    No data found.  Updated Vital Signs BP (!) 154/80 (BP Location: Left Arm)   Pulse (!) 106   Temp 97.8 F (36.6 C) (Oral)   Resp 17   SpO2 100%   Visual Acuity Right Eye Distance:   Left Eye Distance:   Bilateral Distance:    Right Eye Near:   Left Eye Near:    Bilateral Near:     Physical Exam Cardiovascular:     Rate and Rhythm: Normal rate and regular rhythm.     Pulses: Normal pulses.     Heart sounds: Normal heart sounds.  Pulmonary:     Effort: Pulmonary effort is normal.     Breath sounds: Normal breath sounds.  Musculoskeletal:     Comments: Firm swelling over the palmar aspect of the left thumb at the phalangeal joint.  Swelling is cystic in nature, freely mobile.  Its not attached to any overlying or underlying structures.  Is not tender to touch.      UC Treatments / Results  Labs (all labs ordered are listed, but only abnormal results are displayed) Labs Reviewed - No data to display  EKG   Radiology No results found.  Procedures Procedures (including critical care time)  Medications Ordered in UC Medications - No data to display  Initial Impression / Assessment and Plan / UC Course  I have reviewed the triage vital signs and the nursing notes.  Pertinent labs & imaging results that were available during my care of the patient were reviewed by me and considered in my medical decision making (see chart for details).     1.  Ganglion cyst involving the left thumb Hand surgery follow-up recommended Information about hand surgeon has been given to the patient Return precautions given. Final Clinical Impressions(s) / UC Diagnoses   Final diagnoses:  Ganglion cyst of finger of left hand     Discharge Instructions  Please call the  doctor's office to make an appointment.    ED Prescriptions    None     PDMP not reviewed this encounter.   Chase Picket, MD 11/03/20 [phone]

## 2020-11-08 DIAGNOSIS — H04123 Dry eye syndrome of bilateral lacrimal glands: Secondary | ICD-10-CM | POA: Diagnosis not present

## 2020-11-30 ENCOUNTER — Other Ambulatory Visit: Payer: Self-pay | Admitting: Internal Medicine

## 2021-02-23 ENCOUNTER — Other Ambulatory Visit: Payer: Self-pay | Admitting: Internal Medicine

## 2021-04-04 ENCOUNTER — Ambulatory Visit (INDEPENDENT_AMBULATORY_CARE_PROVIDER_SITE_OTHER): Payer: Medicare HMO

## 2021-04-04 DIAGNOSIS — Z0000 Encounter for general adult medical examination without abnormal findings: Secondary | ICD-10-CM | POA: Diagnosis not present

## 2021-04-04 NOTE — Patient Instructions (Signed)
Ms. Hulet , Thank you for taking time to come for your Medicare Wellness Visit. I appreciate your ongoing commitment to your health goals. Please review the following plan we discussed and let me know if I can assist you in the future.   Screening recommendations/referrals: Colonoscopy: 05/03/2017; due every 7 years Mammogram: 08/18/2020; due every 1-2 years Bone Density: 03/18/2018; due every 3 years Recommended yearly ophthalmology/optometry visit for glaucoma screening and checkup Recommended yearly dental visit for hygiene and checkup  Vaccinations: Influenza vaccine: 03/2021 Pneumococcal vaccine: never done Tdap vaccine: 02/11/2015; due every 10 years Shingles vaccine: never done   Covid-19: 08/17/2019, 09/10/2019, 04/30/2020  Advanced directives: Please bring a copy of your health care power of attorney and living will to the office at your convenience.  Conditions/risks identified: Yes; Client understands the importance of follow-up with providers by attending scheduled visits and discussed goals to eat healthier, increase physical activity, exercise the brain, socialize more, get enough sleep and make time for laughter.  Next appointment: 04/05/2022 at 8:40 am Telephone Visit.   Preventive Care 46 Years and Older, Female Preventive care refers to lifestyle choices and visits with your health care provider that can promote health and wellness. What does preventive care include? A yearly physical exam. This is also called an annual well check. Dental exams once or twice a year. Routine eye exams. Ask your health care provider how often you should have your eyes checked. Personal lifestyle choices, including: Daily care of your teeth and gums. Regular physical activity. Eating a healthy diet. Avoiding tobacco and drug use. Limiting alcohol use. Practicing safe sex. Taking low-dose aspirin every day. Taking vitamin and mineral supplements as recommended by your health care  provider. What happens during an annual well check? The services and screenings done by your health care provider during your annual well check will depend on your age, overall health, lifestyle risk factors, and family history of disease. Counseling  Your health care provider may ask you questions about your: Alcohol use. Tobacco use. Drug use. Emotional well-being. Home and relationship well-being. Sexual activity. Eating habits. History of falls. Memory and ability to understand (cognition). Work and work Statistician. Reproductive health. Screening  You may have the following tests or measurements: Height, weight, and BMI. Blood pressure. Lipid and cholesterol levels. These may be checked every 5 years, or more frequently if you are over 34 years old. Skin check. Lung cancer screening. You may have this screening every year starting at age 1 if you have a 30-pack-year history of smoking and currently smoke or have quit within the past 15 years. Fecal occult blood test (FOBT) of the stool. You may have this test every year starting at age 40. Flexible sigmoidoscopy or colonoscopy. You may have a sigmoidoscopy every 5 years or a colonoscopy every 10 years starting at age 64. Hepatitis C blood test. Hepatitis B blood test. Sexually transmitted disease (STD) testing. Diabetes screening. This is done by checking your blood sugar (glucose) after you have not eaten for a while (fasting). You may have this done every 1-3 years. Bone density scan. This is done to screen for osteoporosis. You may have this done starting at age 74. Mammogram. This may be done every 1-2 years. Talk to your health care provider about how often you should have regular mammograms. Talk with your health care provider about your test results, treatment options, and if necessary, the need for more tests. Vaccines  Your health care provider may recommend certain vaccines, such  as: Influenza vaccine. This is  recommended every year. Tetanus, diphtheria, and acellular pertussis (Tdap, Td) vaccine. You may need a Td booster every 10 years. Zoster vaccine. You may need this after age 36. Pneumococcal 13-valent conjugate (PCV13) vaccine. One dose is recommended after age 68. Pneumococcal polysaccharide (PPSV23) vaccine. One dose is recommended after age 43. Talk to your health care provider about which screenings and vaccines you need and how often you need them. This information is not intended to replace advice given to you by your health care provider. Make sure you discuss any questions you have with your health care provider. Document Released: 07/09/2015 Document Revised: 03/01/2016 Document Reviewed: 04/13/2015 Elsevier Interactive Patient Education  2017 [place] Prevention in the Home Falls can cause injuries. They can happen to people of all ages. There are many things you can do to make your home safe and to help prevent falls. What can I do on the outside of my home? Regularly fix the edges of walkways and driveways and fix any cracks. Remove anything that might make you trip as you walk through a door, such as a raised step or threshold. Trim any bushes or trees on the path to your home. Use bright outdoor lighting. Clear any walking paths of anything that might make someone trip, such as rocks or tools. Regularly check to see if handrails are loose or broken. Make sure that both sides of any steps have handrails. Any raised decks and porches should have guardrails on the edges. Have any leaves, snow, or ice cleared regularly. Use sand or salt on walking paths during winter. Clean up any spills in your garage right away. This includes oil or grease spills. What can I do in the bathroom? Use night lights. Install grab bars by the toilet and in the tub and shower. Do not use towel bars as grab bars. Use non-skid mats or decals in the tub or shower. If you need to sit down in  the shower, use a plastic, non-slip stool. Keep the floor dry. Clean up any water that spills on the floor as soon as it happens. Remove soap buildup in the tub or shower regularly. Attach bath mats securely with double-sided non-slip rug tape. Do not have throw rugs and other things on the floor that can make you trip. What can I do in the bedroom? Use night lights. Make sure that you have a light by your bed that is easy to reach. Do not use any sheets or blankets that are too big for your bed. They should not hang down onto the floor. Have a firm chair that has side arms. You can use this for support while you get dressed. Do not have throw rugs and other things on the floor that can make you trip. What can I do in the kitchen? Clean up any spills right away. Avoid walking on wet floors. Keep items that you use a lot in easy-to-reach places. If you need to reach something above you, use a strong step stool that has a grab bar. Keep electrical cords out of the way. Do not use floor polish or wax that makes floors slippery. If you must use wax, use non-skid floor wax. Do not have throw rugs and other things on the floor that can make you trip. What can I do with my stairs? Do not leave any items on the stairs. Make sure that there are handrails on both sides of the stairs and use  them. Fix handrails that are broken or loose. Make sure that handrails are as long as the stairways. Check any carpeting to make sure that it is firmly attached to the stairs. Fix any carpet that is loose or worn. Avoid having throw rugs at the top or bottom of the stairs. If you do have throw rugs, attach them to the floor with carpet tape. Make sure that you have a light switch at the top of the stairs and the bottom of the stairs. If you do not have them, ask someone to add them for you. What else can I do to help prevent falls? Wear shoes that: Do not have high heels. Have rubber bottoms. Are comfortable  and fit you well. Are closed at the toe. Do not wear sandals. If you use a stepladder: Make sure that it is fully opened. Do not climb a closed stepladder. Make sure that both sides of the stepladder are locked into place. Ask someone to hold it for you, if possible. Clearly mark and make sure that you can see: Any grab bars or handrails. First and last steps. Where the edge of each step is. Use tools that help you move around (mobility aids) if they are needed. These include: Canes. Walkers. Scooters. Crutches. Turn on the lights when you go into a dark area. Replace any light bulbs as soon as they burn out. Set up your furniture so you have a clear path. Avoid moving your furniture around. If any of your floors are uneven, fix them. If there are any pets around you, be aware of where they are. Review your medicines with your doctor. Some medicines can make you feel dizzy. This can increase your chance of falling. Ask your doctor what other things that you can do to help prevent falls. This information is not intended to replace advice given to you by your health care provider. Make sure you discuss any questions you have with your health care provider. Document Released: 04/08/2009 Document Revised: 11/18/2015 Document Reviewed: 07/17/2014 Elsevier Interactive Patient Education  2017 [employer].

## 2021-04-04 NOTE — Progress Notes (Signed)
I connected with Angie Garcia today by telephone and verified that I am speaking with the correct person using two identifiers. Location patient: home Location provider: work Persons participating in the virtual visit: patient, provider.   I discussed the limitations, risks, security and privacy concerns of performing an evaluation and management service by telephone and the availability of in person appointments. I also discussed with the patient that there may be a patient responsible charge related to this service. The patient expressed understanding and verbally consented to this telephonic visit.    Interactive audio and video telecommunications were attempted between this provider and patient, however failed, due to patient having technical difficulties OR patient did not have access to video capability.  We continued and completed visit with audio only.  Some vital signs may be absent or patient reported.   Time Spent with patient on telephone encounter: 40 minutes  Subjective:   Angie Garcia is a 70 y.o. female who presents for Medicare Annual (Subsequent) preventive examination.  Review of Systems     Cardiac Risk Factors include: advanced age (>73men, >29 women);dyslipidemia;hypertension;family history of premature cardiovascular disease     Objective:    There were no vitals filed for this visit. There is no height or weight on file to calculate BMI.  Advanced Directives 04/04/2021 04/01/2020 02/14/2019 04/19/2017 09/19/2016  Does Patient Have a Medical Advance Directive? Yes No No No No  Type of Advance Directive Living will;Healthcare Power of Attorney - - - -  Does patient want to make changes to medical advance directive? No - Patient declined - Yes (ED - Information included in AVS) - -  Copy of [place] in Chart? No - copy requested - - - -  Would patient like information on creating a medical advance directive? - No - Patient declined - - -     Current Medications (verified) Outpatient Encounter Medications as of 04/04/2021  Medication Sig   acetaminophen (TYLENOL 8 HOUR ARTHRITIS PAIN) 650 MG CR tablet Take 650 mg by mouth every 8 (eight) hours as needed for pain.   albuterol (PROVENTIL HFA;VENTOLIN HFA) 108 (90 Base) MCG/ACT inhaler Inhale 2 puffs into the lungs every 4 (four) hours as needed for wheezing or shortness of breath.   cetirizine (ZYRTEC) 10 MG tablet Take 1 tablet (10 mg total) by mouth daily.   diltiazem (CARDIZEM CD) 120 MG 24 hr capsule TAKE ONE CAPSULE BY MOUTH ONE TIME DAILY   latanoprost (XALATAN) 0.005 % ophthalmic solution 1 drop at bedtime.   lisinopril (ZESTRIL) 20 MG tablet TAKE ONE TABLET BY MOUTH ONE TIME DAILY   Multiple Vitamin (MULTIVITAMIN) capsule Take 1 capsule by mouth daily.   naproxen sodium (ALEVE) 220 MG tablet Take 220 mg by mouth.   RHOPRESSA 0.02 % SOLN Apply 1 drop to eye at bedtime.   TURMERIC PO Take by mouth.   No facility-administered encounter medications on file as of 04/04/2021.    Allergies (verified) Sulfamethoxazole-trimethoprim   History: Past Medical History:  Diagnosis Date   Allergy    Arthritis    Baker's cyst    Left knee   Hx of adenomatous polyp of colon 05/09/2017   Hypertension    Lactose intolerance    Shingles 10/2008   RUE   Past Surgical History:  Procedure Laterality Date   ABDOMINAL HYSTERECTOMY     Dr Connye Burkitt for fibroids   COLONOSCOPY  12/11/2005   lymphoid aggregate removed (suspected polyp was not), melanosis coli  PILONIDAL CYSTECTOMY     spine cyst   Family History  Problem Relation Age of Onset   Hypertension Mother    Breast cancer Mother        64s   Diabetes Father    Stroke Father        MINI STROKES   Stroke Sister 80   Diabetes Sister    Cancer Maternal Uncle        UNKNOWN TYPE   Colon cancer Neg Hx    Esophageal cancer Neg Hx    Pancreatic cancer Neg Hx    Rectal cancer Neg Hx    Stomach cancer Neg Hx     Hyperparathyroidism Neg Hx    Social History   Socioeconomic History   Marital status: Married    Spouse name: [name]   Number of children: 1   Years of education: Not on file   Highest education level: Not on file  Occupational History   Occupation: retired    Fish farm manager: [employer]  Tobacco Use   Smoking status: Former    Types: Cigarettes    Quit date: 06/27/1995    Years since quitting: 25.7   Smokeless tobacco: Never   Tobacco comments:    smoked [age], up to < 1 ppd  Vaping Use   Vaping Use: Never used  Substance and Sexual Activity   Alcohol use: No   Drug use: No   Sexual activity: Yes  Other Topics Concern   Not on file  Social History Narrative   REGULAR EXERCISE   Social Determinants of Health   Financial Resource Strain: Low Risk    Difficulty of Paying Living Expenses: Not hard at all  Food Insecurity: No Food Insecurity   Worried About Charity fundraiser in the Last Year: Never true   Ran Out of Food in the Last Year: Never true  Transportation Needs: No Transportation Needs   Lack of Transportation (Medical): No   Lack of Transportation (Non-Medical): No  Physical Activity: Sufficiently Active   Days of Exercise per Week: 5 days   Minutes of Exercise per Session: 30 min  Stress: No Stress Concern Present   Feeling of Stress : Not at all  Social Connections: Socially Integrated   Frequency of Communication with Friends and Family: More than three times a week   Frequency of Social Gatherings with Friends and Family: More than three times a week   Attends Religious Services: More than 4 times per year   Active Member of [employer] or Organizations: Yes   Attends Music therapist: More than 4 times per year   Marital Status: Married    Tobacco Counseling Counseling given: Not Answered Tobacco comments: smoked [age], up to < 1 ppd   Clinical Intake:  Pre-visit preparation completed: Yes  Pain : No/denies pain     Nutritional  Risks: Other (Comment) Diabetes: No  How often do you need to have someone help you when you read instructions, pamphlets, or other written materials from your doctor or pharmacy?: 1 - Never What is the last grade level you completed in school?: High School Graduate  Diabetic? no  Interpreter Needed?: No  Information entered by :: Lisette Abu, LPN   Activities of Daily Living In your present state of health, do you have any difficulty performing the following activities: 04/04/2021  Hearing? N  Vision? N  Difficulty concentrating or making decisions? N  Walking or climbing stairs? N  Dressing or bathing? N  Doing errands, shopping? N  Preparing Food and eating ? N  Using the Toilet? N  In the past six months, have you accidently leaked urine? N  Do you have problems with loss of bowel control? N  Managing your Medications? N  Managing your Finances? N  Housekeeping or managing your Housekeeping? N  Some recent data might be hidden    Patient Care Team: Binnie Rail, MD as PCP - General (Internal Medicine) Renato Shin, MD as Consulting Physician (Endocrinology) de Kateri Mc, [place] as Consulting Physician (Optometry) Charlton Haws, [facility] as Pharmacist (Pharmacist)  Indicate any recent Medical Services you may have received from other than [facility] providers in the past year (date may be approximate).     Assessment:   This is a routine wellness examination for [place].  Hearing/Vision screen Hearing Screening - Comments:: Patient denied any hearing difficulty.   No hearing aids.  Vision Screening - Comments:: Patient wears corrective glasses/contacts.  Eye exam done annually by: Dr. Angelica Ran  Dietary issues and exercise activities discussed: Current Exercise Habits: Home exercise routine, Type of exercise: walking;Other - see comments (water aerobics, hiking, [employer]), Time (Minutes): 30, Frequency (Times/Week): 5, Weekly Exercise (Minutes/Week):  150, Intensity: Moderate, Exercise limited by: None identified   Goals Addressed   None   Depression Screen PHQ 2/9 Scores 04/04/2021 04/01/2020 03/25/2019 02/14/2019 12/18/2017  PHQ - 2 Score 0 0 0 0 0    Fall Risk Fall Risk  04/04/2021 04/01/2020 03/25/2019 02/14/2019 12/18/2017  Falls in the past year? 0 0 0 0 No  Number falls in past yr: 0 0 0 0 -  Injury with Fall? 0 0 - - -  Risk for fall due to : No Fall Risks No Fall Risks - - -  Follow up Falls evaluation completed Falls evaluation completed - - -    FALL RISK PREVENTION PERTAINING TO THE HOME:  Any stairs in or around the home? Yes  If so, are there any without handrails? No  Home free of loose throw rugs in walkways, pet beds, electrical cords, etc? Yes  Adequate lighting in your home to reduce risk of falls? Yes   ASSISTIVE DEVICES UTILIZED TO PREVENT FALLS:  Life alert? No  Use of a cane, walker or w/c? No  Grab bars in the bathroom? Yes  Shower chair or bench in shower? Yes  Elevated toilet seat or a handicapped toilet? No   TIMED UP AND GO:  Was the test performed? No .  Length of time to ambulate 10 feet: n/a sec.   Gait steady and fast without use of assistive device  Cognitive Function: Normal cognitive status assessed by direct observation by this Nurse Health Advisor. No abnormalities found.          Immunizations Immunization History  Administered Date(s) Administered   Fluad Quad(high Dose 65+) 05/17/2019, 04/08/2020   Hepatitis B 08/03/1987, 12/27/1987   Influenza,inj,Quad PF,6-35 Mos 04/02/2018   Influenza-Unspecified 03/22/2015, 03/12/2017   Moderna Sars-Covid-2 Vaccination 04/30/2020   PFIZER(Purple Top)SARS-COV-2 Vaccination 08/17/2019, 09/10/2019   Td 06/26/2001   Tdap 02/11/2015   Zoster, Live 04/11/2013    TDAP status: Up to date  Flu Vaccine status: Up to date  Pneumococcal vaccine status: Due, Education has been provided regarding the importance of this vaccine. Advised may  receive this vaccine at local pharmacy or Health Dept. Aware to provide a copy of the vaccination record if obtained from local pharmacy or Health Dept. Verbalized acceptance and understanding.  Covid-19 vaccine status: Completed vaccines  Qualifies for Shingles Vaccine? Yes   Zostavax completed Yes   Shingrix Completed?: No.    Education has been provided regarding the importance of this vaccine. Patient has been advised to call insurance company to determine out of pocket expense if they have not yet received this vaccine. Advised may also receive vaccine at local pharmacy or Health Dept. Verbalized acceptance and understanding.  Screening Tests Health Maintenance  Topic Date Due   Zoster Vaccines- Shingrix (1 of 2) Never done   COVID-19 Vaccine (4 - Booster for Pfizer series) 08/28/2020   INFLUENZA VACCINE  01/24/2021   DEXA SCAN  03/18/2021   MAMMOGRAM  07/29/2022   COLONOSCOPY (Pts 45-7yrs Insurance coverage will need to be confirmed)  05/03/2024   TETANUS/TDAP  08/15/2025   Hepatitis C Screening  Completed   HPV VACCINES  Aged Out    Health Maintenance  Health Maintenance Due  Topic Date Due   Zoster Vaccines- Shingrix (1 of 2) Never done   COVID-19 Vaccine (4 - Booster for Pfizer series) 08/28/2020   INFLUENZA VACCINE  01/24/2021   DEXA SCAN  03/18/2021    Colorectal cancer screening: Type of screening: Colonoscopy. Completed 05/03/2017. Repeat every 7 years  Mammogram status: Completed 2/123/2022. Repeat every year  Bone Density status: Completed 03/18/2018. Results reflect: Bone density results: OSTEOPENIA. Repeat every 3 years.  Lung Cancer Screening: (Low Dose CT Chest recommended if Age 56-80 years, 30 pack-year currently smoking OR have quit w/in 15years.) does not qualify.   Lung Cancer Screening Referral: no   Additional Screening:  Hepatitis C Screening: does qualify; Completed yes  Vision Screening: Recommended annual ophthalmology exams for early  detection of glaucoma and other disorders of the eye. Is the patient up to date with their annual eye exam?  Yes  Who is the provider or what is the name of the office in which the patient attends annual eye exams? [facility] [place], [place]. If pt is not established with a provider, would they like to be referred to a provider to establish care? No .   Dental Screening: Recommended annual dental exams for proper oral hygiene  Community Resource Referral / Chronic Care Management: CRR required this visit?  No   CCM required this visit?  No      Plan:     I have personally reviewed and noted the following in the patient's chart:   Medical and social history Use of alcohol, tobacco or illicit drugs  Current medications and supplements including opioid prescriptions.  Functional ability and status Nutritional status Physical activity Advanced directives List of other physicians Hospitalizations, surgeries, and ER visits in previous 12 months Vitals Screenings to include cognitive, depression, and falls Referrals and appointments  In addition, I have reviewed and discussed with patient certain preventive protocols, quality metrics, and best practice recommendations. A written personalized care plan for preventive services as well as general preventive health recommendations were provided to patient.     Sheral Flow, LPN   [id number]   Nurse Notes:  Patient is cogitatively intact. There were no vitals filed for this visit. There is no height or weight on file to calculate BMI. Patient stated that she has no issues with gait or balance; does not use any assistive devices. Medications reviewed with patient; no opioid use noted.

## 2021-04-10 DIAGNOSIS — M8580 Other specified disorders of bone density and structure, unspecified site: Secondary | ICD-10-CM | POA: Insufficient documentation

## 2021-04-10 NOTE — Patient Instructions (Addendum)
You can consider trying red yeast rice for your cholesterol if you do not want to take a statin.     Blood work was ordered.     Medications changes include :  scopolamine for your cruise   Your prescription(s) have been submitted to your pharmacy. Please take as directed and contact our office if you believe you are having problem(s) with the medication(s).  Please followup in 1 year    Health Maintenance, Female Adopting a healthy lifestyle and getting preventive care are important in promoting health and wellness. Ask your health care provider about: The right schedule for you to have regular tests and exams. Things you can do on your own to prevent diseases and keep yourself healthy. What should I know about diet, weight, and exercise? Eat a healthy diet  Eat a diet that includes plenty of vegetables, fruits, low-fat dairy products, and lean protein. Do not eat a lot of foods that are high in solid fats, added sugars, or sodium. Maintain a healthy weight Body mass index (BMI) is used to identify weight problems. It estimates body fat based on height and weight. Your health care provider can help determine your BMI and help you achieve or maintain a healthy weight. Get regular exercise Get regular exercise. This is one of the most important things you can do for your health. Most adults should: Exercise for at least 150 minutes each week. The exercise should increase your heart rate and make you sweat (moderate-intensity exercise). Do strengthening exercises at least twice a week. This is in addition to the moderate-intensity exercise. Spend less time sitting. Even light physical activity can be beneficial. Watch cholesterol and blood lipids Have your blood tested for lipids and cholesterol at 68 years of age, then have this test every 5 years. Have your cholesterol levels checked more often if: Your lipid or cholesterol levels are high. You are older than 68 years of age. You  are at high risk for heart disease. What should I know about cancer screening? Depending on your health history and family history, you may need to have cancer screening at various ages. This may include screening for: Breast cancer. Cervical cancer. Colorectal cancer. Skin cancer. Lung cancer. What should I know about heart disease, diabetes, and high blood pressure? Blood pressure and heart disease High blood pressure causes heart disease and increases the risk of stroke. This is more likely to develop in people who have high blood pressure readings, are of African descent, or are overweight. Have your blood pressure checked: Every 3-5 years if you are 55-38 years of age. Every year if you are 40 years old or older. Diabetes Have regular diabetes screenings. This checks your fasting blood sugar level. Have the screening done: Once every three years after age 54 if you are at a normal weight and have a low risk for diabetes. More often and at a younger age if you are overweight or have a high risk for diabetes. What should I know about preventing infection? Hepatitis B If you have a higher risk for hepatitis B, you should be screened for this virus. Talk with your health care provider to find out if you are at risk for hepatitis B infection. Hepatitis C Testing is recommended for: Everyone born from [birth year] through 1963. Anyone with known risk factors for hepatitis C. Sexually transmitted infections (STIs) Get screened for STIs, including gonorrhea and chlamydia, if: You are sexually active and are younger than 68 years of age. You  are older than 68 years of age and your health care provider tells you that you are at risk for this type of infection. Your sexual activity has changed since you were last screened, and you are at increased risk for chlamydia or gonorrhea. Ask your health care provider if you are at risk. Ask your health care provider about whether you are at high risk for  HIV. Your health care provider may recommend a prescription medicine to help prevent HIV infection. If you choose to take medicine to prevent HIV, you should first get tested for HIV. You should then be tested every 3 months for as long as you are taking the medicine. Pregnancy If you are about to stop having your period (premenopausal) and you may become pregnant, seek counseling before you get pregnant. Take 400 to 800 micrograms (mcg) of folic acid every day if you become pregnant. Ask for birth control (contraception) if you want to prevent pregnancy. Osteoporosis and menopause Osteoporosis is a disease in which the bones lose minerals and strength with aging. This can result in bone fractures. If you are 28 years old or older, or if you are at risk for osteoporosis and fractures, ask your health care provider if you should: Be screened for bone loss. Take a calcium or vitamin D supplement to lower your risk of fractures. Be given hormone replacement therapy (HRT) to treat symptoms of menopause. Follow these instructions at home: Lifestyle Do not use any products that contain nicotine or tobacco, such as cigarettes, e-cigarettes, and chewing tobacco. If you need help quitting, ask your health care provider. Do not use street drugs. Do not share needles. Ask your health care provider for help if you need support or information about quitting drugs. Alcohol use Do not drink alcohol if: Your health care provider tells you not to drink. You are pregnant, may be pregnant, or are planning to become pregnant. If you drink alcohol: Limit how much you use to 0-1 drink a day. Limit intake if you are breastfeeding. Be aware of how much alcohol is in your drink. In the [place] one drink equals one 12 oz bottle of beer (355 mL), one 5 oz glass of wine (148 mL), or one 1 oz glass of hard liquor (44 mL). General instructions Schedule regular health, dental, and eye exams. Stay current with your  vaccines. Tell your health care provider if: You often feel depressed. You have ever been abused or do not feel safe at home. Summary Adopting a healthy lifestyle and getting preventive care are important in promoting health and wellness. Follow your health care provider's instructions about healthy diet, exercising, and getting tested or screened for diseases. Follow your health care provider's instructions on monitoring your cholesterol and blood pressure. This information is not intended to replace advice given to you by your health care provider. Make sure you discuss any questions you have with your health care provider. Document Revised: 08/20/2020 Document Reviewed: 06/05/2018 Elsevier Patient Education  2022 [employer].

## 2021-04-10 NOTE — Progress Notes (Signed)
Subjective:    Patient ID: Angie Garcia, female    DOB: 04/06/1951, 70 y.o.   MRN: [id number]   This visit occurred during the SARS-CoV-2 public health emergency.  Safety protocols were in place, including screening questions prior to the visit, additional usage of staff PPE, and extensive cleaning of exam room while observing appropriate contact time as indicated for disinfecting solutions.    HPI She is here for a physical exam.   She denies anything new.  She has no concerns.    Medications and allergies reviewed with patient and updated if appropriate.  Patient Active Problem List   Diagnosis Date Noted   Osteopenia 04/10/2021   Hyperparathyroidism, primary ([place]) 03/14/2018   Multinodular goiter 03/14/2018   Hx of adenomatous polyp of colon 05/09/2017   Cyst of left kidney 10/21/2015   Prediabetes 08/19/2015   Hyperlipidemia 06/02/2014   DJD (degenerative joint disease) of knee 04/01/2013   Hemorrhoids, internal, with bleeding 07/04/2011   LACTOSE INTOLERANCE 11/18/2009   RHINITIS 11/18/2009   History of herpes zoster 10/28/2008   Essential hypertension 01/24/2008    Current Outpatient Medications on File Prior to Visit  Medication Sig Dispense Refill   acetaminophen (TYLENOL) 650 MG CR tablet Take 650 mg by mouth every 8 (eight) hours as needed for pain.     albuterol (PROVENTIL HFA;VENTOLIN HFA) 108 (90 Base) MCG/ACT inhaler Inhale 2 puffs into the lungs every 4 (four) hours as needed for wheezing or shortness of breath. 1 Inhaler 0   cetirizine (ZYRTEC) 10 MG tablet Take 1 tablet (10 mg total) by mouth daily. 30 tablet 0   diltiazem (CARDIZEM CD) 120 MG 24 hr capsule TAKE ONE CAPSULE BY MOUTH ONE TIME DAILY 90 capsule 3   latanoprost (XALATAN) 0.005 % ophthalmic solution 1 drop at bedtime.     lisinopril (ZESTRIL) 20 MG tablet TAKE ONE TABLET BY MOUTH ONE TIME DAILY 90 tablet 3   Multiple Vitamin (MULTIVITAMIN) capsule Take 1 capsule by mouth daily.     naproxen  sodium (ALEVE) 220 MG tablet Take 220 mg by mouth.     RHOPRESSA 0.02 % SOLN Apply 1 drop to eye at bedtime.     TURMERIC PO Take by mouth.     No current facility-administered medications on file prior to visit.    Past Medical History:  Diagnosis Date   Allergy    Arthritis    Baker's cyst    Left knee   Hx of adenomatous polyp of colon 05/09/2017   Hypertension    Lactose intolerance    Shingles 10/2008   RUE    Past Surgical History:  Procedure Laterality Date   ABDOMINAL HYSTERECTOMY     Dr Connye Burkitt for fibroids   COLONOSCOPY  12/11/2005   lymphoid aggregate removed (suspected polyp was not), melanosis coli   PILONIDAL CYSTECTOMY     spine cyst    Social History   Socioeconomic History   Marital status: Married    Spouse name: [name]   Number of children: 1   Years of education: Not on file   Highest education level: Not on file  Occupational History   Occupation: retired    Fish farm manager: [employer]  Tobacco Use   Smoking status: Former    Types: Cigarettes    Quit date: 06/27/1995    Years since quitting: 25.8   Smokeless tobacco: Never   Tobacco comments:    smoked [age], up to < 1 ppd  Vaping Use  Vaping Use: Never used  Substance and Sexual Activity   Alcohol use: No   Drug use: No   Sexual activity: Yes  Other Topics Concern   Not on file  Social History Narrative   REGULAR EXERCISE   Social Determinants of Health   Financial Resource Strain: Low Risk    Difficulty of Paying Living Expenses: Not hard at all  Food Insecurity: No Food Insecurity   Worried About Charity fundraiser in the Last Year: Never true   Ran Out of Food in the Last Year: Never true  Transportation Needs: No Transportation Needs   Lack of Transportation (Medical): No   Lack of Transportation (Non-Medical): No  Physical Activity: Sufficiently Active   Days of Exercise per Week: 5 days   Minutes of Exercise per Session: 30 min  Stress: No Stress Concern Present   Feeling  of Stress : Not at all  Social Connections: Socially Integrated   Frequency of Communication with Friends and Family: More than three times a week   Frequency of Social Gatherings with Friends and Family: More than three times a week   Attends Religious Services: More than 4 times per year   Active Member of [employer] or Organizations: Yes   Attends Music therapist: More than 4 times per year   Marital Status: Married    Family History  Problem Relation Age of Onset   Hypertension Mother    Breast cancer Mother        60s   Diabetes Father    Stroke Father        MINI STROKES   Stroke Sister 59   Diabetes Sister    Cancer Maternal Uncle        UNKNOWN TYPE   Colon cancer Neg Hx    Esophageal cancer Neg Hx    Pancreatic cancer Neg Hx    Rectal cancer Neg Hx    Stomach cancer Neg Hx    Hyperparathyroidism Neg Hx     Review of Systems  Constitutional:  Negative for chills and fever.  Eyes:  Negative for visual disturbance.  Respiratory:  Negative for cough, shortness of breath and wheezing.   Cardiovascular:  Negative for chest pain, palpitations and leg swelling.  Gastrointestinal:  Negative for abdominal pain, blood in stool, constipation, diarrhea and nausea.       No gerd  Genitourinary:  Negative for dysuria and hematuria.  Musculoskeletal:  Negative for arthralgias and back pain.  Skin:  Negative for color change and rash.  Neurological:  Negative for light-headedness and headaches.  Psychiatric/Behavioral:  Negative for dysphoric mood. The patient is not nervous/anxious.       Objective:   Vitals:   04/11/21 1013 04/11/21 1019  BP: 130/82 124/80  Pulse:    Temp:    SpO2:     Filed Weights   04/11/21 1009  Weight: 172 lb (78 kg)   Body mass index is 28.62 kg/m.  BP Readings from Last 3 Encounters:  04/11/21 124/80  11/01/20 (!) 154/80  04/08/20 140/88    Wt Readings from Last 3 Encounters:  04/11/21 172 lb (78 kg)  04/08/20 178 lb (80.7  kg)  03/25/19 178 lb (80.7 kg)     Physical Exam Constitutional: She appears well-developed and well-nourished. No distress.  HENT:  Head: Normocephalic and atraumatic.  Right Ear: External ear normal. Normal ear canal and TM Left Ear: External ear normal.  Normal ear canal and TM Mouth/Throat: Oropharynx is  clear and moist.  Eyes: Conjunctivae and EOM are normal.  Neck: Neck supple. No tracheal deviation present. No thyromegaly present.  No carotid bruit  Cardiovascular: Normal rate, regular rhythm and normal heart sounds.   No murmur heard.  No edema. Pulmonary/Chest: Effort normal and breath sounds normal. No respiratory distress. She has no wheezes. She has no rales.  Breast: deferred   Abdominal: Soft. She exhibits no distension. There is no tenderness.  Lymphadenopathy: She has no cervical adenopathy.  Skin: Skin is warm and dry. She is not diaphoretic.  Psychiatric: She has a normal mood and affect. Her behavior is normal.    The 10-year ASCVD risk score (Arnett DK, et al., 2019) is: 11%   Values used to calculate the score:     Age: 61 years     Sex: Female     Is Non-Hispanic African American: Yes     Diabetic: No     Tobacco smoker: No     Systolic Blood Pressure: 335 mmHg     Is BP treated: Yes     HDL Cholesterol: 67.1 mg/dL     Total Cholesterol: 222 mg/dL   Lab Results  Component Value Date   WBC 5.5 04/11/2021   HGB 12.8 04/11/2021   HCT 39.7 04/11/2021   PLT 343.0 04/11/2021   GLUCOSE 99 04/11/2021   CHOL 222 (H) 04/11/2021   TRIG 65.0 04/11/2021   HDL 67.10 04/11/2021   LDLDIRECT 120.1 09/09/2012   LDLCALC 142 (H) 04/11/2021   ALT 13 04/11/2021   AST 20 04/11/2021   NA 139 04/11/2021   K 4.4 04/11/2021   CL 106 04/11/2021   CREATININE 0.92 04/11/2021   BUN 23 04/11/2021   CO2 26 04/11/2021   TSH 0.53 04/11/2021   HGBA1C 5.7 04/11/2021         Assessment & Plan:   Physical exam: Screening blood work  ordered Exercise  regular -  walking and water aerobics Weight  has lost weight - walking more and eating better Substance abuse  none   Reviewed recommended immunizations.   Health Maintenance  Topic Date Due   DEXA SCAN  03/18/2021   COVID-19 Vaccine (4 - Booster for Pfizer series) 04/27/2021 (Originally 08/28/2020)   Zoster Vaccines- Shingrix (1 of 2) 07/12/2021 (Originally 12/13/2002)   MAMMOGRAM  07/29/2022   COLONOSCOPY (Pts 45-72yrs Insurance coverage will need to be confirmed)  05/03/2024   TETANUS/TDAP  08/15/2025   INFLUENZA VACCINE  Completed   Hepatitis C Screening  Completed   HPV VACCINES  Aged Out          See Problem List for Assessment and Plan of chronic medical problems.

## 2021-04-11 ENCOUNTER — Other Ambulatory Visit: Payer: Self-pay

## 2021-04-11 ENCOUNTER — Encounter: Payer: Self-pay | Admitting: Internal Medicine

## 2021-04-11 ENCOUNTER — Ambulatory Visit (INDEPENDENT_AMBULATORY_CARE_PROVIDER_SITE_OTHER): Payer: Medicare HMO | Admitting: Internal Medicine

## 2021-04-11 ENCOUNTER — Ambulatory Visit (INDEPENDENT_AMBULATORY_CARE_PROVIDER_SITE_OTHER): Payer: Medicare HMO | Admitting: Pharmacist

## 2021-04-11 VITALS — BP 124/80 | HR 75 | Temp 98.2°F | Ht 65.0 in | Wt 172.0 lb

## 2021-04-11 DIAGNOSIS — E210 Primary hyperparathyroidism: Secondary | ICD-10-CM | POA: Diagnosis not present

## 2021-04-11 DIAGNOSIS — E782 Mixed hyperlipidemia: Secondary | ICD-10-CM

## 2021-04-11 DIAGNOSIS — Z0000 Encounter for general adult medical examination without abnormal findings: Secondary | ICD-10-CM | POA: Diagnosis not present

## 2021-04-11 DIAGNOSIS — I10 Essential (primary) hypertension: Secondary | ICD-10-CM | POA: Diagnosis not present

## 2021-04-11 DIAGNOSIS — R7303 Prediabetes: Secondary | ICD-10-CM

## 2021-04-11 DIAGNOSIS — M8589 Other specified disorders of bone density and structure, multiple sites: Secondary | ICD-10-CM

## 2021-04-11 LAB — CBC WITH DIFFERENTIAL/PLATELET
Basophils Absolute: 0 10*3/uL (ref 0.0–0.1)
Basophils Relative: 0.9 % (ref 0.0–3.0)
Eosinophils Absolute: 0.2 10*3/uL (ref 0.0–0.7)
Eosinophils Relative: 2.9 % (ref 0.0–5.0)
HCT: 39.7 % (ref 36.0–46.0)
Hemoglobin: 12.8 g/dL (ref 12.0–15.0)
Lymphocytes Relative: 33.2 % (ref 12.0–46.0)
Lymphs Abs: 1.8 10*3/uL (ref 0.7–4.0)
MCHC: 32.2 g/dL (ref 30.0–36.0)
MCV: 85.5 fl (ref 78.0–100.0)
Monocytes Absolute: 0.5 10*3/uL (ref 0.1–1.0)
Monocytes Relative: 8.3 % (ref 3.0–12.0)
Neutro Abs: 3 10*3/uL (ref 1.4–7.7)
Neutrophils Relative %: 54.7 % (ref 43.0–77.0)
Platelets: 343 10*3/uL (ref 150.0–400.0)
RBC: 4.64 Mil/uL (ref 3.87–5.11)
RDW: 13 % (ref 11.5–15.5)
WBC: 5.5 10*3/uL (ref 4.0–10.5)

## 2021-04-11 LAB — LIPID PANEL
Cholesterol: 222 mg/dL — ABNORMAL HIGH (ref 0–200)
HDL: 67.1 mg/dL (ref >39.00)
LDL Cholesterol: 142 mg/dL — ABNORMAL HIGH (ref 0–99)
NonHDL: 154.69
Total CHOL/HDL Ratio: 3
Triglycerides: 65 mg/dL (ref 0.0–149.0)
VLDL: 13 mg/dL (ref 0.0–40.0)

## 2021-04-11 LAB — COMPREHENSIVE METABOLIC PANEL
ALT: 13 U/L (ref 0–35)
AST: 20 U/L (ref 0–37)
Albumin: 4.2 g/dL (ref 3.5–5.2)
Alkaline Phosphatase: 81 U/L (ref 39–117)
BUN: 23 mg/dL (ref 6–23)
CO2: 26 mEq/L (ref 19–32)
Calcium: 11.1 mg/dL — ABNORMAL HIGH (ref 8.4–10.5)
Chloride: 106 mEq/L (ref 96–112)
Creatinine, Ser: 0.92 mg/dL (ref 0.40–1.20)
GFR: 64.06 mL/min (ref >60.00)
Glucose, Bld: 99 mg/dL (ref 70–99)
Potassium: 4.4 mEq/L (ref 3.5–5.1)
Sodium: 139 mEq/L (ref 135–145)
Total Bilirubin: 0.3 mg/dL (ref 0.2–1.2)
Total Protein: 7 g/dL (ref 6.0–8.3)

## 2021-04-11 LAB — TSH: TSH: 0.53 u[IU]/mL (ref 0.35–5.50)

## 2021-04-11 LAB — HEMOGLOBIN A1C: Hgb A1c MFr Bld: 5.7 % (ref 4.6–6.5)

## 2021-04-11 MED ORDER — SCOPOLAMINE 1 MG/3DAYS TD PT72: 1.0000 | MEDICATED_PATCH | TRANSDERMAL | 12 refills | Status: DC

## 2021-04-11 NOTE — Assessment & Plan Note (Signed)
Chronic Regular exercise and healthy diet encouraged Check lipid panel  Currently controlling this with lifestyle I did recommend a statin, but she would prefer not to take a statin.  We will see what her lipid panel looks like today and I will give her an idea of her ASCVD risk Discussed that if she decides not to take a statin she can consider red yeast rice and we can consider rechecking her cholesterol in a couple of months

## 2021-04-11 NOTE — Patient Instructions (Signed)
Visit Information  Phone number for Pharmacist: [phone]  Thank you for meeting with me to discuss your medications! I look forward to working with you to achieve your health care goals. Below is a summary of what we talked about during the visit:   Goals Addressed             This Visit's Progress    Eat Healthy       Timeframe:  Long-Range Goal Priority:  High Start Date:     04/11/21                        Expected End Date:     04/11/22                  Follow Up Date April 2023   - change to whole grain breads, cereal, pasta - drink 6 to 8 glasses of water each day - fill half of plate with vegetables - limit fast food meals to no more than 1 per week - manage portion size - read food labels for fat, fiber, carbohydrates and portion size - switch to sugar-free drinks    Why is this important?   When you are ready to manage your nutrition or weight, having a plan and setting goals will help.  Taking small steps to change how you eat and exercise is a good place to start.    Notes:         Care Plan : [place]  Updates made by Charlton Haws, RPH since 04/11/2021 12:00 AM     Problem: CH[facility] AMB "PATIENT-SPECIFIC PROBLEM"      Long-Range Goal: Disease management   Start Date: 04/11/2021  Expected End Date: 04/11/2022  This Visit's Progress: On track  Priority: High  Note:   Current Barriers:  Unable to independently monitor therapeutic efficacy Suboptimal therapeutic regimen for hyperlipidemia  Pharmacist Clinical Goal(s):  Patient will achieve adherence to monitoring guidelines and medication adherence to achieve therapeutic efficacy adhere to plan to optimize therapeutic regimen for hyperlipidemia as evidenced by report of adherence to recommended medication management changes through collaboration with PharmD and provider.   Interventions: 1:1 collaboration with Binnie Rail, MD regarding development and update of comprehensive  plan of care as evidenced by provider attestation and co-signature Inter-disciplinary care team collaboration (see longitudinal plan of care) Comprehensive medication review performed; medication list updated in electronic medical record  Hypertension (BP goal <130/80) -Controlled - BP is at goal in recent office visit and at home per patient report; she endorses compliance with medications -Current home readings: 117/72 -Current treatment: Diltiazem 120 mg daily Lisinopril 20 mg daily -Current dietary habits: eating more fruits, vegetables; trying to limit salt -Current exercise habits: walking 2-3 days per week, water aerobics weekly -Denies hypotensive/hypertensive symptoms -Educated on BP goals and benefits of medications for prevention of heart attack, stroke and kidney damage; Daily salt intake goal < 2300 mg; Exercise goal of 150 minutes per week; Importance of home blood pressure monitoring; -Counseled to monitor BP at home daily, document, and provide log at future appointments -Recommended to continue current medication  Hyperlipidemia: (LDL goal < 100) -Not ideally controlled - LDL is elevated; pt has declined medication therapy in the past, opting for lifestyle changes;  -Current treatment: None -Educated on Cholesterol goals; Benefits of statin for ASCVD risk reduction; Importance of limiting foods high in cholesterol; Exercise goal of 150 minutes per week; cholesterol pathophysiology (80%  from liver, 20% from diet) -Recommend starting a statin if LDL remains > 100  Osteopenia (Goal prevent fractures) -Controlled - pt has discussed repeat DEXA with PCP;  -Last DEXA Scan: 03/18/2018   T-Score femoral neck: -1.0  T-Score lumbar spine: -1.1  T-Score forearm radius: -1/5  10-year probability of major osteoporotic fracture: 3.5%  10-year probability of hip fracture: 0.3% -Patient is not a candidate for pharmacologic treatment -Current treatment  Vitamin D 1000 IU -Recommend  [phone] units of vitamin D daily. Recommend 1200 mg of calcium daily from dietary and supplemental sources.  Prediabetes (Goal: prevent progression) -Controlled - A1c is prediabetic range; repeat labwork today -Counseled on preventing progression to diabetes through lifestyle changes (limiting carbs, regular exercise)  Osteoarthritis (Goal: manage pain) -Controlled - pt reports she uses heating pad for pain management most of the time; she limits Aleve as much as possible -Current treatment  Tylenol 650 mg Tumeric Naproxen 220 mg- limited use -Counseled on risks of NSAID use for BP and kidney health -Recommended to continue current medication  Allergic rhinitis (Goal: manage symptoms) -Controlled - pt reports allergie are seasonal, she is not taking anything right now -Current treatment  Cetirizine 10 mg daily Albuterol HFA prn -Recommended to continue current medication  Health Maintenance -Vaccine gaps: none -Current therapy:  Multivitamin -Recommended to continue current medication  Patient Goals/Self-Care Activities Patient will:  - take medications as prescribed -focus on medication adherence by pill box -check blood pressure daily -target a minimum of 150 minutes of moderate intensity exercise weekly -engage in dietary modifications by limiting carbs, high-cholesterol foods      Ms. Warren was given information about Chronic Care Management services today including:  CCM service includes personalized support from designated clinical staff supervised by her physician, including individualized plan of care and coordination with other care providers 24/7 contact phone numbers for assistance for urgent and routine care needs. Standard insurance, coinsurance, copays and deductibles apply for chronic care management only during months in which we provide at least 20 minutes of these services. Most insurances cover these services at 100%, however patients may be responsible for  any copay, coinsurance and/or deductible if applicable. This service may help you avoid the need for more expensive face-to-face services. Only one practitioner may furnish and bill the service in a calendar month. The patient may stop CCM services at any time (effective at the end of the month) by phone call to the office staff.  Patient agreed to services and verbal consent obtained.   Patient verbalizes understanding of instructions provided today and agrees to view in [place].  Telephone follow up appointment with pharmacy team member scheduled for: 6 months  Charlene Brooke, PharmD, [place], CPP Clinical Pharmacist [facility] [phone]

## 2021-04-11 NOTE — Progress Notes (Signed)
Chronic Care Management Pharmacy Note  04/11/2021 Name:  Angie Garcia MRN:  [id number] DOB:  1951/03/05  Summary: -Historically lipids have been above goal; pt has discussed statin therapy with PCP; counseled on cholesterol pathophysiology, benefits of statin therapy -Counseled on diet/exercise plan to improve chronic conditions  Recommendations/Changes made from today's visit: -LDL goal < 100; consider statin if labwork today is not at goal   Subjective: Angie Garcia is an 70 y.o. year old female who is a primary patient of Burns, Claudina Lick, MD.  The CCM team was consulted for assistance with disease management and care coordination needs.    Engaged with patient face to face for initial visit in response to provider referral for pharmacy case management and/or care coordination services.   Consent to Services:  The patient was given the following information about Chronic Care Management services today, agreed to services, and gave verbal consent: 1. CCM service includes personalized support from designated clinical staff supervised by the primary care provider, including individualized plan of care and coordination with other care providers 2. 24/7 contact phone numbers for assistance for urgent and routine care needs. 3. Service will only be billed when office clinical staff spend 20 minutes or more in a month to coordinate care. 4. Only one practitioner may furnish and bill the service in a calendar month. 5.The patient may stop CCM services at any time (effective at the end of the month) by phone call to the office staff. 6. The patient will be responsible for cost sharing (co-pay) of up to 20% of the service fee (after annual deductible is met). Patient agreed to services and consent obtained.  Patient Care Team: Binnie Rail, MD as PCP - General (Internal Medicine) Renato Shin, MD as Consulting Physician (Endocrinology) de Kateri Mc, [place] as Consulting Physician  (Optometry) Charlton Haws, [facility] as Pharmacist (Pharmacist)  Recent office visits: 04/08/20 Dr Quay Burow OV: CPE. Lipids high, advised medication. Pt asked to recheck lipids in 3 months.   Recent consult visits: 11/01/20 Urgent Care - c/o hand pain. Dx ganglion cyst. Rec hand surgery f/u.   Hospital visits: None in previous 6 months   Objective:  Lab Results  Component Value Date   CREATININE 0.87 04/08/2020   BUN 19 04/08/2020   GFR 68.78 04/08/2020   NA 140 04/08/2020   K 4.2 04/08/2020   CALCIUM 10.9 (H) 04/08/2020   CO2 26 04/08/2020   GLUCOSE 95 04/08/2020    Lab Results  Component Value Date/Time   HGBA1C 5.9 04/08/2020 10:57 AM   HGBA1C 5.8 03/25/2019 08:50 AM   GFR 68.78 04/08/2020 10:57 AM   GFR 89.33 03/25/2019 08:50 AM    Last diabetic Eye exam: No results found for: HMDIABEYEEXA  Last diabetic Foot exam: No results found for: HMDIABFOOTEX   Lab Results  Component Value Date   CHOL 249 (H) 07/26/2020   HDL 73.10 07/26/2020   LDLCALC 154 (H) 07/26/2020   LDLDIRECT 120.1 09/09/2012   TRIG 106.0 07/26/2020   CHOLHDL 3 07/26/2020    Hepatic Function Latest Ref Rng & Units 04/08/2020 03/25/2019 06/18/2018  Total Protein 6.0 - 8.3 g/dL 7.0 7.0 6.8  Albumin 3.5 - 5.2 g/dL 4.1 4.2 3.9  AST 0 - 37 U/L 18 17 32  ALT 0 - 35 U/L 12 13 54(H)  Alk Phosphatase 39 - 117 U/L 76 81 67  Total Bilirubin 0.2 - 1.2 mg/dL 0.4 0.3 0.3  Bilirubin, Direct 0.0 - 0.3 mg/dL - - -  Lab Results  Component Value Date/Time   TSH 0.60 04/08/2020 10:57 AM   TSH 0.74 03/25/2019 08:50 AM   FREET4 0.84 08/16/2015 10:04 AM    CBC Latest Ref Rng & Units 04/08/2020 03/25/2019 06/18/2018  WBC 4.0 - 10.5 K/uL 5.6 5.2 11.9(H)  Hemoglobin 12.0 - 15.0 g/dL 12.6 13.1 13.6  Hematocrit 36.0 - 46.0 % 38.5 39.7 42.3  Platelets 150.0 - 400.0 K/uL 326.0 348.0 339.0    Lab Results  Component Value Date/Time   VD25OH 34.89 12/18/2017 11:56 AM    Clinical ASCVD: No  The 10-year ASCVD  risk score (Arnett DK, et al., 2019) is: 12.1%   Values used to calculate the score:     Age: 36 years     Sex: Female     Is Non-Hispanic African American: Yes     Diabetic: No     Tobacco smoker: No     Systolic Blood Pressure: 211 mmHg     Is BP treated: Yes     HDL Cholesterol: 73.1 mg/dL     Total Cholesterol: 249 mg/dL    Depression screen [facility] 2/9 04/04/2021 04/01/2020 03/25/2019  Decreased Interest 0 0 0  Down, Depressed, Hopeless 0 0 0  PHQ - 2 Score 0 0 0     Social History   Tobacco Use  Smoking Status Former   Types: Cigarettes   Quit date: 06/27/1995   Years since quitting: 25.8  Smokeless Tobacco Never  Tobacco Comments   smoked 18-19, up to < 1 ppd   BP Readings from Last 3 Encounters:  04/11/21 124/80  11/01/20 (!) 154/80  04/08/20 140/88   Pulse Readings from Last 3 Encounters:  04/11/21 75  11/01/20 (!) 106  04/08/20 62   Wt Readings from Last 3 Encounters:  04/11/21 172 lb (78 kg)  04/08/20 178 lb (80.7 kg)  03/25/19 178 lb (80.7 kg)   BMI Readings from Last 3 Encounters:  04/11/21 28.62 kg/m  04/08/20 29.62 kg/m  03/25/19 29.62 kg/m    Assessment/Interventions: Review of patient past medical history, allergies, medications, health status, including review of consultants reports, laboratory and other test data, was performed as part of comprehensive evaluation and provision of chronic care management services.   SDOH:  (Social Determinants of Health) assessments and interventions performed: Yes  SDOH Screenings   Alcohol Screen: Low Risk    Last Alcohol Screening Score (AUDIT): 0  Depression (PHQ2-9): Low Risk    PHQ-2 Score: 0  Financial Resource Strain: Low Risk    Difficulty of Paying Living Expenses: Not hard at all  Food Insecurity: No Food Insecurity   Worried About Charity fundraiser in the Last Year: Never true   Ran Out of Food in the Last Year: Never true  Housing: Low Risk    Last Housing Risk Score: 0  Physical Activity:  Sufficiently Active   Days of Exercise per Week: 5 days   Minutes of Exercise per Session: 30 min  Social Connections: Engineer, building services of Communication with Friends and Family: More than three times a week   Frequency of Social Gatherings with Friends and Family: More than three times a week   Attends Religious Services: More than 4 times per year   Active Member of [employer] or Organizations: Yes   Attends Music therapist: More than 4 times per year   Marital Status: Married  Stress: No Stress Concern Present   Feeling of Stress : Not at all  Tobacco Use: Medium Risk   Smoking Tobacco Use: Former   Smokeless Tobacco Use: Never  Transportation Needs: No Data processing manager (Medical): No   Lack of Transportation (Non-Medical): No    CCM Care Plan  Allergies  Allergen Reactions   Sulfamethoxazole-Trimethoprim     Rash Because of a history of documented adverse serious drug reaction;Medi Alert bracelet  is recommended    Medications Reviewed Today     Reviewed by Charlton Haws, [facility] (Pharmacist) on 04/11/21 at 1146  Med List Status: <None>   Medication Order Taking? Sig Documenting Provider Last Dose Status Informant  acetaminophen (TYLENOL) 650 MG CR tablet [id number] Yes Take 650 mg by mouth every 8 (eight) hours as needed for pain. [provider] Taking Active Self  albuterol (PROVENTIL HFA;VENTOLIN HFA) 108 (90 Base) MCG/ACT inhaler [id number] Yes Inhale 2 puffs into the lungs every 4 (four) hours as needed for wheezing or shortness of breath. Janne Napoleon, NP Taking Active   cetirizine (ZYRTEC) 10 MG tablet [id number] Yes Take 1 tablet (10 mg total) by mouth daily. Zigmund Gottron, NP Taking Active   cholecalciferol (VITAMIN D3) 25 MCG (1000 UNIT) tablet [id number] Yes Take 1,000 Units by mouth daily. [provider] Taking Active   diltiazem (CARDIZEM CD) 120 MG 24 hr capsule [id number] Yes TAKE ONE  CAPSULE BY MOUTH ONE TIME DAILY Burns, Claudina Lick, MD Taking Active   latanoprost (XALATAN) 0.005 % ophthalmic solution [id number] Yes 1 drop at bedtime. [provider] Taking Active   lisinopril (ZESTRIL) 20 MG tablet [id number] Yes TAKE ONE TABLET BY MOUTH ONE TIME DAILY Binnie Rail, MD Taking Active   Multiple Vitamin (MULTIVITAMIN) capsule [id number] Yes Take 1 capsule by mouth daily. [provider] Taking Active   naproxen sodium (ALEVE) 220 MG tablet [id number] Yes Take 220 mg by mouth. [provider] Taking Active Self  RHOPRESSA 0.02 % SOLN [id number] Yes Apply 1 drop to eye at bedtime. [provider] Taking Active   scopolamine (TRANSDERM-SCOP, 1.5 MG,) 1 MG/3DAYS [id number] Yes Place 1 patch (1.5 mg total) onto the skin every 3 (three) days. Binnie Rail, MD Taking Active   TURMERIC PO [id number] Yes Take by mouth. [provider] Taking Active             Patient Active Problem List   Diagnosis Date Noted   Osteopenia 04/10/2021   Hyperparathyroidism, primary ([place]) 03/14/2018   Multinodular goiter 03/14/2018   Hx of adenomatous polyp of colon 05/09/2017   Cyst of left kidney 10/21/2015   Prediabetes 08/19/2015   Hyperlipidemia 06/02/2014   DJD (degenerative joint disease) of knee 04/01/2013   Hemorrhoids, internal, with bleeding 07/04/2011   LACTOSE INTOLERANCE 11/18/2009   RHINITIS 11/18/2009   History of herpes zoster 10/28/2008   Essential hypertension 01/24/2008    Immunization History  Administered Date(s) Administered   Fluad Quad(high Dose 65+) 05/17/2019, 04/08/2020   Hepatitis B 08/03/1987, 12/27/1987   Influenza, High Dose Seasonal PF 04/01/2021   Influenza,inj,Quad PF,6-35 Mos 04/02/2018   Influenza-Unspecified 03/22/2015, 03/12/2017   Moderna Sars-Covid-2 Vaccination 04/30/2020   PFIZER(Purple Top)SARS-COV-2 Vaccination 08/17/2019, 09/10/2019   Td 06/26/2001   Tdap 02/11/2015   Zoster, Live 04/11/2013     Conditions to be addressed/monitored:  Hypertension, Hyperlipidemia, and Osteopenia  Care Plan : [place]  Updates made by Charlton Haws, [place] since 04/11/2021 12:00 AM     Problem: CHL AMB "PATIENT-SPECIFIC PROBLEM"  Long-Range Goal: Disease management   Start Date: 04/11/2021  Expected End Date: 04/11/2022  This Visit's Progress: On track  Priority: High  Note:   Current Barriers:  Unable to independently monitor therapeutic efficacy Suboptimal therapeutic regimen for hyperlipidemia  Pharmacist Clinical Goal(s):  Patient will achieve adherence to monitoring guidelines and medication adherence to achieve therapeutic efficacy adhere to plan to optimize therapeutic regimen for hyperlipidemia as evidenced by report of adherence to recommended medication management changes through collaboration with PharmD and provider.   Interventions: 1:1 collaboration with Binnie Rail, MD regarding development and update of comprehensive plan of care as evidenced by provider attestation and co-signature Inter-disciplinary care team collaboration (see longitudinal plan of care) Comprehensive medication review performed; medication list updated in electronic medical record  Hypertension (BP goal <130/80) -Controlled - BP is at goal in recent office visit and at home per patient report; she endorses compliance with medications -Current home readings: 117/72 -Current treatment: Diltiazem 120 mg daily Lisinopril 20 mg daily -Current dietary habits: eating more fruits, vegetables; trying to limit salt -Current exercise habits: walking 2-3 days per week, water aerobics weekly -Denies hypotensive/hypertensive symptoms -Educated on BP goals and benefits of medications for prevention of heart attack, stroke and kidney damage; Daily salt intake goal < 2300 mg; Exercise goal of 150 minutes per week; Importance of home blood pressure monitoring; -Counseled to monitor BP at  home daily, document, and provide log at future appointments -Recommended to continue current medication  Hyperlipidemia: (LDL goal < 100) -Not ideally controlled - LDL is elevated; pt has declined medication therapy in the past, opting for lifestyle changes;  -Current treatment: None -Educated on Cholesterol goals; Benefits of statin for ASCVD risk reduction; Importance of limiting foods high in cholesterol; Exercise goal of 150 minutes per week; cholesterol pathophysiology (80% from liver, 20% from diet) -Recommend starting a statin if LDL remains > 100  Osteopenia (Goal prevent fractures) -Controlled - pt has discussed repeat DEXA with PCP;  -Last DEXA Scan: 03/18/2018   T-Score femoral neck: -1.0  T-Score lumbar spine: -1.1  T-Score forearm radius: -1/5  10-year probability of major osteoporotic fracture: 3.5%  10-year probability of hip fracture: 0.3% -Patient is not a candidate for pharmacologic treatment -Current treatment  Vitamin D 1000 IU -Recommend [phone] units of vitamin D daily. Recommend 1200 mg of calcium daily from dietary and supplemental sources.  Prediabetes (Goal: prevent progression) -Controlled - A1c is prediabetic range; repeat labwork today -Counseled on preventing progression to diabetes through lifestyle changes (limiting carbs, regular exercise)  Osteoarthritis (Goal: manage pain) -Controlled - pt reports she uses heating pad for pain management most of the time; she limits Aleve as much as possible -Current treatment  Tylenol 650 mg Tumeric Naproxen 220 mg- limited use -Counseled on risks of NSAID use for BP and kidney health -Recommended to continue current medication  Allergic rhinitis (Goal: manage symptoms) -Controlled - pt reports allergie are seasonal, she is not taking anything right now -Current treatment  Cetirizine 10 mg daily Albuterol HFA prn -Recommended to continue current medication  Health Maintenance -Vaccine gaps:  none -Current therapy:  Multivitamin -Recommended to continue current medication  Patient Goals/Self-Care Activities Patient will:  - take medications as prescribed -focus on medication adherence by pill box -check blood pressure daily -target a minimum of 150 minutes of moderate intensity exercise weekly -engage in dietary modifications by limiting carbs, high-cholesterol foods      Medication Assistance: None required.  Patient affirms current coverage meets needs.  Compliance/Adherence/Medication fill history: Care Gaps: DEXA scan (03/18/21)  Star-Rating Drugs: Lisinopril - LF 02/23/21 x 90 ds  Patient's preferred pharmacy is:  [facility] [address] [place], [place]. AT [address] [place] [address] Phone: [phone] Fax: [phone]  Uses pill box? Yes Pt endorses 100% compliance  We discussed: Current pharmacy is preferred with insurance plan and patient is satisfied with pharmacy services Patient decided to: Continue current medication management strategy  Care Plan and Follow Up Patient Decision:  Patient agrees to Care Plan and Follow-up.  Plan: Telephone follow up appointment with care management team member scheduled for:  6 months  Charlene Brooke, PharmD, [place], CPP Clinical Pharmacist [facility] [phone]

## 2021-04-11 NOTE — Assessment & Plan Note (Signed)
Chronic Check a1c Low sugar / carb diet Stressed regular exercise  

## 2021-04-11 NOTE — Assessment & Plan Note (Signed)
Chronic Blood pressure well controlled CMP Continue diltiazem CD1 20 mg daily, lisinopril 20 mg daily

## 2021-04-11 NOTE — Assessment & Plan Note (Signed)
Chronic DEXA due-ordered Continue regular exercise Continue vitamin D Unable to take calcium secondary to hyperparathyroidism

## 2021-04-11 NOTE — Assessment & Plan Note (Signed)
Chronic Was following with endocrine, but has not followed up with them recently Check PTH, calcium

## 2021-04-13 LAB — PTH, INTACT AND CALCIUM
Calcium: 11.3 mg/dL — ABNORMAL HIGH (ref 8.6–10.4)
PTH: 81 pg/mL — ABNORMAL HIGH (ref 16–77)

## 2021-04-17 ENCOUNTER — Encounter: Payer: Self-pay | Admitting: Internal Medicine

## 2021-07-07 ENCOUNTER — Other Ambulatory Visit: Payer: Self-pay | Admitting: Internal Medicine

## 2021-07-07 DIAGNOSIS — Z1231 Encounter for screening mammogram for malignant neoplasm of breast: Secondary | ICD-10-CM

## 2021-08-19 ENCOUNTER — Ambulatory Visit
Admission: RE | Admit: 2021-08-19 | Discharge: 2021-08-19 | Disposition: A | Discharge status: Home / Self Care | Payer: Medicare HMO | Source: Ambulatory Visit | Attending: Internal Medicine | Admitting: Internal Medicine

## 2021-08-19 DIAGNOSIS — Z1231 Encounter for screening mammogram for malignant neoplasm of breast: Secondary | ICD-10-CM

## 2021-10-10 ENCOUNTER — Telehealth: Payer: Medicare HMO

## 2021-11-07 DIAGNOSIS — H43391 Other vitreous opacities, right eye: Secondary | ICD-10-CM | POA: Diagnosis not present

## 2022-01-03 ENCOUNTER — Telehealth: Payer: Medicare HMO

## 2022-01-05 ENCOUNTER — Telehealth: Payer: Medicare HMO

## 2022-02-09 ENCOUNTER — Other Ambulatory Visit: Payer: Self-pay | Admitting: Internal Medicine

## 2022-04-05 ENCOUNTER — Ambulatory Visit (INDEPENDENT_AMBULATORY_CARE_PROVIDER_SITE_OTHER): Payer: Medicare HMO

## 2022-04-05 VITALS — Ht 65.0 in | Wt 169.0 lb

## 2022-04-05 DIAGNOSIS — Z0000 Encounter for general adult medical examination without abnormal findings: Secondary | ICD-10-CM | POA: Diagnosis not present

## 2022-04-05 NOTE — Patient Instructions (Addendum)
Angie Garcia , Thank you for taking time to come for your Medicare Wellness Visit. I appreciate your ongoing commitment to your health goals. Please review the following plan we discussed and let me know if I can assist you in the future.   These are the goals we discussed:  Goals      My goal is to eat healthy and increase my water intake.        This is a list of the screening recommended for you and due dates:  Health Maintenance  Topic Date Due   Zoster (Shingles) Vaccine (1 of 2) Never done   Pneumonia Vaccine (1 - PCV) Never done   COVID-19 Vaccine (4 - Pfizer series) 06/25/2020   DEXA scan (bone density measurement)  03/18/2021   Mammogram  08/20/2023   Colon Cancer Screening  05/03/2024   Tetanus Vaccine  08/15/2025   Flu Shot  Completed   Hepatitis C Screening: USPSTF Recommendation to screen - Ages 18-79 yo.  Completed   HPV Vaccine  Aged Out    Advanced directives: Yes  Conditions/risks identified: Yes  Next appointment: Follow up in one year for your annual wellness visit.   Preventive Care 69 Years and Older, Female Preventive care refers to lifestyle choices and visits with your health care provider that can promote health and wellness. What does preventive care include? A yearly physical exam. This is also called an annual well check. Dental exams once or twice a year. Routine eye exams. Ask your health care provider how often you should have your eyes checked. Personal lifestyle choices, including: Daily care of your teeth and gums. Regular physical activity. Eating a healthy diet. Avoiding tobacco and drug use. Limiting alcohol use. Practicing safe sex. Taking low-dose aspirin every day. Taking vitamin and mineral supplements as recommended by your health care provider. What happens during an annual well check? The services and screenings done by your health care provider during your annual well check will depend on your age, overall health, lifestyle risk  factors, and family history of disease. Counseling  Your health care provider may ask you questions about your: Alcohol use. Tobacco use. Drug use. Emotional well-being. Home and relationship well-being. Sexual activity. Eating habits. History of falls. Memory and ability to understand (cognition). Work and work Statistician. Reproductive health. Screening  You may have the following tests or measurements: Height, weight, and BMI. Blood pressure. Lipid and cholesterol levels. These may be checked every 5 years, or more frequently if you are over 32 years old. Skin check. Lung cancer screening. You may have this screening every year starting at age 18 if you have a 30-pack-year history of smoking and currently smoke or have quit within the past 15 years. Fecal occult blood test (FOBT) of the stool. You may have this test every year starting at age 73. Flexible sigmoidoscopy or colonoscopy. You may have a sigmoidoscopy every 5 years or a colonoscopy every 10 years starting at age 66. Hepatitis C blood test. Hepatitis B blood test. Sexually transmitted disease (STD) testing. Diabetes screening. This is done by checking your blood sugar (glucose) after you have not eaten for a while (fasting). You may have this done every 1-3 years. Bone density scan. This is done to screen for osteoporosis. You may have this done starting at age 32. Mammogram. This may be done every 1-2 years. Talk to your health care provider about how often you should have regular mammograms. Talk with your health care provider about your  test results, treatment options, and if necessary, the need for more tests. Vaccines  Your health care provider may recommend certain vaccines, such as: Influenza vaccine. This is recommended every year. Tetanus, diphtheria, and acellular pertussis (Tdap, Td) vaccine. You may need a Td booster every 10 years. Zoster vaccine. You may need this after age 56. Pneumococcal 13-valent  conjugate (PCV13) vaccine. One dose is recommended after age 27. Pneumococcal polysaccharide (PPSV23) vaccine. One dose is recommended after age 59. Talk to your health care provider about which screenings and vaccines you need and how often you need them. This information is not intended to replace advice given to you by your health care provider. Make sure you discuss any questions you have with your health care provider. Document Released: 07/09/2015 Document Revised: 03/01/2016 Document Reviewed: 04/13/2015 Elsevier Interactive Patient Education  2017 [place] Prevention in the Home Falls can cause injuries. They can happen to people of all ages. There are many things you can do to make your home safe and to help prevent falls. What can I do on the outside of my home? Regularly fix the edges of walkways and driveways and fix any cracks. Remove anything that might make you trip as you walk through a door, such as a raised step or threshold. Trim any bushes or trees on the path to your home. Use bright outdoor lighting. Clear any walking paths of anything that might make someone trip, such as rocks or tools. Regularly check to see if handrails are loose or broken. Make sure that both sides of any steps have handrails. Any raised decks and porches should have guardrails on the edges. Have any leaves, snow, or ice cleared regularly. Use sand or salt on walking paths during winter. Clean up any spills in your garage right away. This includes oil or grease spills. What can I do in the bathroom? Use night lights. Install grab bars by the toilet and in the tub and shower. Do not use towel bars as grab bars. Use non-skid mats or decals in the tub or shower. If you need to sit down in the shower, use a plastic, non-slip stool. Keep the floor dry. Clean up any water that spills on the floor as soon as it happens. Remove soap buildup in the tub or shower regularly. Attach bath mats  securely with double-sided non-slip rug tape. Do not have throw rugs and other things on the floor that can make you trip. What can I do in the bedroom? Use night lights. Make sure that you have a light by your bed that is easy to reach. Do not use any sheets or blankets that are too big for your bed. They should not hang down onto the floor. Have a firm chair that has side arms. You can use this for support while you get dressed. Do not have throw rugs and other things on the floor that can make you trip. What can I do in the kitchen? Clean up any spills right away. Avoid walking on wet floors. Keep items that you use a lot in easy-to-reach places. If you need to reach something above you, use a strong step stool that has a grab bar. Keep electrical cords out of the way. Do not use floor polish or wax that makes floors slippery. If you must use wax, use non-skid floor wax. Do not have throw rugs and other things on the floor that can make you trip. What can I do with my  stairs? Do not leave any items on the stairs. Make sure that there are handrails on both sides of the stairs and use them. Fix handrails that are broken or loose. Make sure that handrails are as long as the stairways. Check any carpeting to make sure that it is firmly attached to the stairs. Fix any carpet that is loose or worn. Avoid having throw rugs at the top or bottom of the stairs. If you do have throw rugs, attach them to the floor with carpet tape. Make sure that you have a light switch at the top of the stairs and the bottom of the stairs. If you do not have them, ask someone to add them for you. What else can I do to help prevent falls? Wear shoes that: Do not have high heels. Have rubber bottoms. Are comfortable and fit you well. Are closed at the toe. Do not wear sandals. If you use a stepladder: Make sure that it is fully opened. Do not climb a closed stepladder. Make sure that both sides of the stepladder  are locked into place. Ask someone to hold it for you, if possible. Clearly mark and make sure that you can see: Any grab bars or handrails. First and last steps. Where the edge of each step is. Use tools that help you move around (mobility aids) if they are needed. These include: Canes. Walkers. Scooters. Crutches. Turn on the lights when you go into a dark area. Replace any light bulbs as soon as they burn out. Set up your furniture so you have a clear path. Avoid moving your furniture around. If any of your floors are uneven, fix them. If there are any pets around you, be aware of where they are. Review your medicines with your doctor. Some medicines can make you feel dizzy. This can increase your chance of falling. Ask your doctor what other things that you can do to help prevent falls. This information is not intended to replace advice given to you by your health care provider. Make sure you discuss any questions you have with your health care provider. Document Released: 04/08/2009 Document Revised: 11/18/2015 Document Reviewed: 07/17/2014 Elsevier Interactive Patient Education  2017 [employer].

## 2022-04-05 NOTE — Progress Notes (Signed)
Virtual Visit via Telephone Note  I connected with  Angie Garcia on 04/05/22 at  8:45 AM EDT by telephone and verified that I am speaking with the correct person using two identifiers.  Location: Patient: Home Provider: [place] Persons participating in the virtual visit: [place]   I discussed the limitations, risks, security and privacy concerns of performing an evaluation and management service by telephone and the availability of in person appointments. The patient expressed understanding and agreed to proceed.  Interactive audio and video telecommunications were attempted between this nurse and patient, however failed, due to patient having technical difficulties OR patient did not have access to video capability.  We continued and completed visit with audio only.  Some vital signs may be absent or patient reported.   Sheral Flow, LPN  Subjective:   Angie Garcia is a 71 y.o. female who presents for Medicare Annual (Subsequent) preventive examination.  Review of Systems     Cardiac Risk Factors include: advanced age (>18mn, >>107women);dyslipidemia;family history of premature cardiovascular disease;hypertension     Objective:    Today's Vitals   04/05/22 0840  Weight: 169 lb (76.7 kg)  Height: '5\' 5"'$  (1.651 m)  PainSc: 0-No pain   Body mass index is 28.12 kg/m.     04/05/2022    8:49 AM 04/04/2021    8:46 AM 04/01/2020    8:31 AM 02/14/2019   11:20 AM 04/19/2017    2:03 PM 09/19/2016    3:57 PM  Advanced Directives  Does Patient Have a Medical Advance Directive? Yes Yes No No No No  Type of [place] Living will;Healthcare Power of Attorney      Does patient want to make changes to medical advance directive?  No - Patient declined  Yes (ED - Information included in AVS)    Copy of HMillportin Chart? No - copy requested No - copy requested      Would patient  like information on creating a medical advance directive?   No - Patient declined       Current Medications (verified) Outpatient Encounter Medications as of 04/05/2022  Medication Sig   acetaminophen (TYLENOL) 650 MG CR tablet Take 650 mg by mouth every 8 (eight) hours as needed for pain.   albuterol (PROVENTIL HFA;VENTOLIN HFA) 108 (90 Base) MCG/ACT inhaler Inhale 2 puffs into the lungs every 4 (four) hours as needed for wheezing or shortness of breath.   cetirizine (ZYRTEC) 10 MG tablet Take 1 tablet (10 mg total) by mouth daily.   diltiazem (CARDIZEM CD) 120 MG 24 hr capsule TAKE ONE CAPSULE BY MOUTH ONE TIME DAILY   latanoprost (XALATAN) 0.005 % ophthalmic solution 1 drop at bedtime.   lisinopril (ZESTRIL) 20 MG tablet TAKE ONE TABLET BY MOUTH ONE TIME DAILY   naproxen sodium (ALEVE) 220 MG tablet Take 220 mg by mouth.   RHOPRESSA 0.02 % SOLN Apply 1 drop to eye at bedtime.   TURMERIC PO Take by mouth.   [DISCONTINUED] cholecalciferol (VITAMIN D3) 25 MCG (1000 UNIT) tablet Take 1,000 Units by mouth daily.   [DISCONTINUED] Multiple Vitamin (MULTIVITAMIN) capsule Take 1 capsule by mouth daily.   [DISCONTINUED] scopolamine (TRANSDERM-SCOP, 1.5 MG,) 1 MG/3DAYS Place 1 patch (1.5 mg total) onto the skin every 3 (three) days.   No facility-administered encounter medications on file as of 04/05/2022.    Allergies (verified) Sulfamethoxazole-trimethoprim   History: Past Medical History:  Diagnosis  Date   Allergy    Arthritis    Baker's cyst    Left knee   Hx of adenomatous polyp of colon 05/09/2017   Hypertension    Lactose intolerance    Shingles 10/2008   RUE   Past Surgical History:  Procedure Laterality Date   ABDOMINAL HYSTERECTOMY     Dr Connye Burkitt for fibroids   COLONOSCOPY  12/11/2005   lymphoid aggregate removed (suspected polyp was not), melanosis coli   PILONIDAL CYSTECTOMY     spine cyst   Family History  Problem Relation Age of Onset   Hypertension Mother     Breast cancer Mother        78s   Diabetes Father    Stroke Father        MINI STROKES   Stroke Sister 10   Diabetes Sister    Cancer Maternal Uncle        UNKNOWN TYPE   Colon cancer Neg Hx    Esophageal cancer Neg Hx    Pancreatic cancer Neg Hx    Rectal cancer Neg Hx    Stomach cancer Neg Hx    Hyperparathyroidism Neg Hx    Social History   Socioeconomic History   Marital status: Married    Spouse name: [name]   Number of children: 1   Years of education: Not on file   Highest education level: Not on file  Occupational History   Occupation: retired    Fish farm manager: [employer]  Tobacco Use   Smoking status: Former    Types: Cigarettes    Quit date: 06/27/1995    Years since quitting: 26.7   Smokeless tobacco: Never   Tobacco comments:    smoked [age], up to < 1 ppd  Vaping Use   Vaping Use: Never used  Substance and Sexual Activity   Alcohol use: No   Drug use: No   Sexual activity: Yes  Other Topics Concern   Not on file  Social History Narrative   REGULAR EXERCISE   Social Determinants of Health   Financial Resource Strain: Low Risk  (04/05/2022)   Overall Financial Resource Strain (CARDIA)    Difficulty of Paying Living Expenses: Not hard at all  Food Insecurity: No Food Insecurity (04/05/2022)   Hunger Vital Sign    Worried About Running Out of Food in the Last Year: Never true    Ran Out of Food in the Last Year: Never true  Transportation Needs: No Transportation Needs (04/05/2022)   PRAPARE - Hydrologist (Medical): No    Lack of Transportation (Non-Medical): No  Physical Activity: Sufficiently Active (04/05/2022)   Exercise Vital Sign    Days of Exercise per Week: 5 days    Minutes of Exercise per Session: 30 min  Stress: No Stress Concern Present (04/05/2022)   [place]    Feeling of Stress : Not at all  Social Connections: [place]  (04/05/2022)   Social Connection and Isolation Panel [NHANES]    Frequency of Communication with Friends and Family: More than three times a week    Frequency of Social Gatherings with Friends and Family: More than three times a week    Attends Religious Services: More than 4 times per year    Active Member of [employer] or Organizations: Yes    Attends Music therapist: More than 4 times per year    Marital Status: Married  Tobacco Counseling Counseling given: Not Answered Tobacco comments: smoked [age], up to < 1 ppd   Clinical Intake:  Pre-visit preparation completed: Yes  Pain : No/denies pain Pain Score: 0-No pain     BMI - recorded: 28.12 Nutritional Status: BMI 25 -29 Overweight Nutritional Risks: None Diabetes: No  How often do you need to have someone help you when you read instructions, pamphlets, or other written materials from your doctor or pharmacy?: 1 - Never What is the last grade level you completed in school?: HSG  Diabetic? no  Interpreter Needed?: No  Information entered by :: Lisette Abu, LPN.   Activities of Daily Living    04/05/2022    8:42 AM  In your present state of health, do you have any difficulty performing the following activities:  Hearing? 0  Vision? 0  Difficulty concentrating or making decisions? 0  Walking or climbing stairs? 0  Dressing or bathing? 0  Doing errands, shopping? 0  Preparing Food and eating ? N  Using the Toilet? N  In the past six months, have you accidently leaked urine? N  Do you have problems with loss of bowel control? N  Managing your Medications? N  Managing your Finances? N  Housekeeping or managing your Housekeeping? N    Patient Care Team: Binnie Rail, MD as PCP - General (Internal Medicine) Renato Shin, MD (Inactive) as Consulting Physician (Endocrinology) de Kateri Mc, OD as Consulting Physician (Optometry) [place], Cleaster [place], [facility] as Pharmacist  (Pharmacist)  Indicate any recent Medical Services you may have received from other than [facility] providers in the past year (date may be approximate).     Assessment:   This is a routine wellness examination for [place].  Hearing/Vision screen Hearing Screening - Comments:: Denies hearing difficulties   Vision Screening - Comments:: Wears rx glasses - up to date with routine eye exams with [facility], OD.   Dietary issues and exercise activities discussed: Current Exercise Habits: Home exercise routine, Type of exercise: walking, Time (Minutes): 30, Frequency (Times/Week): 5, Weekly Exercise (Minutes/Week): 150, Intensity: Moderate, Exercise limited by: None identified   Goals Addressed             This Visit's Progress    My goal is to eat healthy and increase my water intake.        Depression Screen    04/05/2022    8:41 AM 04/04/2021    8:48 AM 04/01/2020    8:30 AM 03/25/2019    8:13 AM 02/14/2019   11:35 AM 12/18/2017   11:12 AM  PHQ 2/9 Scores  PHQ - 2 Score 0 0 0 0 0 0    Fall Risk    04/05/2022    8:42 AM 04/04/2021    8:47 AM 04/01/2020    8:31 AM 03/25/2019    8:13 AM 02/14/2019   11:35 AM  Fall Risk   Falls in the past year? 0 0 0 0 0  Number falls in past yr: 0 0 0 0 0  Injury with Fall? 0 0 0    Risk for fall due to : No Fall Risks No Fall Risks No Fall Risks    Follow up Falls prevention discussed Falls evaluation completed Falls evaluation completed      [place]:  Any stairs in or around the home? Yes  If so, are there any without handrails? No  Home free of loose throw rugs in walkways, pet beds,  electrical cords, etc? Yes  Adequate lighting in your home to reduce risk of falls? Yes   ASSISTIVE DEVICES UTILIZED TO PREVENT FALLS:  Life alert? No  Use of a cane, walker or w/c? No  Grab bars in the bathroom? Yes  Shower chair or bench in shower? Yes  Elevated toilet seat or a handicapped toilet? Yes    TIMED UP AND GO:  Was the test performed? No . Phone Visit  Cognitive Function:        04/05/2022    8:43 AM  6CIT Screen  What Year? 0 points  What month? 0 points  What time? 0 points  Count back from 20 0 points  Months in reverse 0 points  Repeat phrase 0 points  Total Score 0 points    Immunizations Immunization History  Administered Date(s) Administered   Fluad Quad(high Dose 65+) 05/17/2019, 04/08/2020   Hepatitis B 08/03/1987, 12/27/1987   Influenza, High Dose Seasonal PF 04/01/2021   Influenza,inj,Quad PF,6-35 Mos 04/02/2018   Influenza-Unspecified 03/22/2015, 03/12/2017   Moderna Sars-Covid-2 Vaccination 04/30/2020   PFIZER(Purple Top)SARS-COV-2 Vaccination 08/17/2019, 09/10/2019   Td 06/26/2001   Tdap 02/11/2015   Zoster, Live 04/11/2013    TDAP status: Up to date  Flu Vaccine status: Up to date  Pneumococcal vaccine status: Due, Education has been provided regarding the importance of this vaccine. Advised may receive this vaccine at local pharmacy or Health Dept. Aware to provide a copy of the vaccination record if obtained from local pharmacy or Health Dept. Verbalized acceptance and understanding.  Covid-19 vaccine status: Completed vaccines  Qualifies for Shingles Vaccine? Yes   Zostavax completed Yes   Shingrix Completed?: No.    Education has been provided regarding the importance of this vaccine. Patient has been advised to call insurance company to determine out of pocket expense if they have not yet received this vaccine. Advised may also receive vaccine at local pharmacy or Health Dept. Verbalized acceptance and understanding.  Screening Tests Health Maintenance  Topic Date Due   Zoster Vaccines- Shingrix (1 of 2) Never done   Pneumonia Vaccine 6+ Years old (1 - PCV) Never done   COVID-19 Vaccine (4 - Pfizer series) 06/25/2020   DEXA SCAN  03/18/2021   INFLUENZA VACCINE  01/24/2022   MAMMOGRAM  08/20/2023   COLONOSCOPY (Pts 45-61yr  Insurance coverage will need to be confirmed)  05/03/2024   TETANUS/TDAP  08/15/2025   Hepatitis C Screening  Completed   HPV VACCINES  Aged Out    Health Maintenance  Health Maintenance Due  Topic Date Due   Zoster Vaccines- Shingrix (1 of 2) Never done   Pneumonia Vaccine 69 Years old (1 - PCV) Never done   COVID-19 Vaccine (4 - Pfizer series) 06/25/2020   DEXA SCAN  03/18/2021   INFLUENZA VACCINE  01/24/2022    Colorectal cancer screening: Type of screening: Colonoscopy. Completed 05/03/2017. Repeat every 7 years  Mammogram status: Completed 08/19/2021. Repeat every year  Bone Density status: Completed 03/18/2018. Results reflect: Bone density results: OSTEOPENIA. Repeat every 3 years.  Lung Cancer Screening: (Low Dose CT Chest recommended if Age 654-80years, 30 pack-year currently smoking OR have quit w/in 15years.) does not qualify.   Lung Cancer Screening Referral: no  Additional Screening:  Hepatitis C Screening: does qualify; Completed 08/16/2015  Vision Screening: Recommended annual ophthalmology exams for early detection of glaucoma and other disorders of the eye. Is the patient up to date with their annual eye exam?  Yes  Who  is the provider or what is the name of the office in which the patient attends annual eye exams? [facility] [place]. If pt is not established with a provider, would they like to be referred to a provider to establish care? No .   Dental Screening: Recommended annual dental exams for proper oral hygiene  Community Resource Referral / Chronic Care Management: CRR required this visit?  No   CCM required this visit?  No      Plan:     I have personally reviewed and noted the following in the patient's chart:   Medical and social history Use of alcohol, tobacco or illicit drugs  Current medications and supplements including opioid prescriptions. Patient is not currently taking opioid prescriptions. Functional ability and  status Nutritional status Physical activity Advanced directives List of other physicians Hospitalizations, surgeries, and ER visits in previous 12 months Vitals Screenings to include cognitive, depression, and falls Referrals and appointments  In addition, I have reviewed and discussed with patient certain preventive protocols, quality metrics, and best practice recommendations. A written personalized care plan for preventive services as well as general preventive health recommendations were provided to patient.     Sheral Flow, LPN   39/08/90   Nurse Notes: N/A

## 2022-04-17 NOTE — Progress Notes (Signed)
Subjective:    Patient ID: Angie Garcia, female    DOB: [birth year], 71 y.o.   MRN: [id number]      HPI Jamil is here for a Physical exam.    She is watching her 2 month granddaughter daily during the week.  She also has a  puppy at home.  She does walk the dog regularly and is very active.  Medications and allergies reviewed with patient and updated if appropriate.  Current Outpatient Medications on File Prior to Visit  Medication Sig Dispense Refill   acetaminophen (TYLENOL) 650 MG CR tablet Take 650 mg by mouth every 8 (eight) hours as needed for pain.     cetirizine (ZYRTEC) 10 MG tablet Take 1 tablet (10 mg total) by mouth daily. 30 tablet 0   diltiazem (CARDIZEM CD) 120 MG 24 hr capsule TAKE ONE CAPSULE BY MOUTH ONE TIME DAILY 90 capsule 3   latanoprost (XALATAN) 0.005 % ophthalmic solution 1 drop at bedtime.     lisinopril (ZESTRIL) 20 MG tablet TAKE ONE TABLET BY MOUTH ONE TIME DAILY 90 tablet 3   naproxen sodium (ALEVE) 220 MG tablet Take 220 mg by mouth.     RHOPRESSA 0.02 % SOLN Apply 1 drop to eye at bedtime.     TURMERIC PO Take by mouth.     No current facility-administered medications on file prior to visit.    Review of Systems  Constitutional:  Negative for fever.  Eyes:  Negative for visual disturbance.  Respiratory:  Negative for cough, shortness of breath and wheezing.   Cardiovascular:  Negative for chest pain, palpitations and leg swelling.  Gastrointestinal:  Negative for abdominal pain, blood in stool, constipation, diarrhea and nausea.       No gerd  Genitourinary:  Negative for dysuria.  Musculoskeletal:  Positive for arthralgias (shoulder, knee). Negative for back pain.  Skin:  Negative for rash.       Skin lump right lateral upper leg x 1 year - not getting larger  Neurological:  Negative for light-headedness and headaches.  Psychiatric/Behavioral:  Negative for dysphoric mood. The patient is not nervous/anxious.        Objective:    Vitals:   04/18/22 1428  BP: 136/80  Pulse: 75  Temp: 98.4 F (36.9 C)  SpO2: 99%   Filed Weights   04/18/22 1428  Weight: 169 lb (76.7 kg)   Body mass index is 28.12 kg/m.  BP Readings from Last 3 Encounters:  04/18/22 136/80  04/11/21 124/80  11/01/20 (!) 154/80    Wt Readings from Last 3 Encounters:  04/18/22 169 lb (76.7 kg)  04/05/22 169 lb (76.7 kg)  04/11/21 172 lb (78 kg)       Physical Exam Constitutional: She appears well-developed and well-nourished. No distress.  HENT:  Head: Normocephalic and atraumatic.  Right Ear: External ear normal. Normal ear canal and TM Left Ear: External ear normal.  Normal ear canal and TM Mouth/Throat: Oropharynx is clear and moist.  Eyes: Conjunctivae normal.  Neck: Neck supple. No tracheal deviation present. No thyromegaly present.  No carotid bruit  Cardiovascular: Normal rate, regular rhythm and normal heart sounds.   No murmur heard.  No edema. Pulmonary/Chest: Effort normal and breath sounds normal. No respiratory distress. She has no wheezes. She has no rales.  Breast: deferred   Abdominal: Soft. She exhibits no distension. There is no tenderness.  Lymphadenopathy: She has no cervical adenopathy.  Skin: Skin is warm and dry. She  is not diaphoretic.  Psychiatric: She has a normal mood and affect. Her behavior is normal.     Lab Results  Component Value Date   WBC 5.5 04/11/2021   HGB 12.8 04/11/2021   HCT 39.7 04/11/2021   PLT 343.0 04/11/2021   GLUCOSE 99 04/11/2021   CHOL 222 (H) 04/11/2021   TRIG 65.0 04/11/2021   HDL 67.10 04/11/2021   LDLDIRECT 120.1 09/09/2012   LDLCALC 142 (H) 04/11/2021   ALT 13 04/11/2021   AST 20 04/11/2021   NA 139 04/11/2021   K 4.4 04/11/2021   CL 106 04/11/2021   CREATININE 0.92 04/11/2021   BUN 23 04/11/2021   CO2 26 04/11/2021   TSH 0.53 04/11/2021   HGBA1C 5.7 04/11/2021   The 10-year ASCVD risk score (Arnett DK, et al., 2019) is: 14.1%   Values used to  calculate the score:     Age: 20 years     Sex: Female     Is Non-Hispanic African American: Yes     Diabetic: No     Tobacco smoker: No     Systolic Blood Pressure: 671 mmHg     Is BP treated: Yes     HDL Cholesterol: 67.1 mg/dL     Total Cholesterol: 222 mg/dL       Assessment & Plan:   Physical exam: Screening blood work  ordered Exercise  walking dog Weight  good  Substance abuse  none   Reviewed recommended immunizations.  Deferred today   Dexa ordered.  She will call to schedule given her busy schedule.    Health Maintenance  Topic Date Due   Zoster Vaccines- Shingrix (1 of 2) Never done   Pneumonia Vaccine 60+ Years old (1 - PCV) Never done   COVID-19 Vaccine (4 - Pfizer series) 06/25/2020   DEXA SCAN  03/18/2021   Medicare Annual Wellness (AWV)  05/06/2023   MAMMOGRAM  08/20/2023   COLONOSCOPY (Pts 45-81yr Insurance coverage will need to be confirmed)  05/03/2024   TETANUS/TDAP  08/15/2025   INFLUENZA VACCINE  Completed   Hepatitis C Screening  Completed   HPV VACCINES  Aged Out          See Problem List for Assessment and Plan of chronic medical problems.

## 2022-04-17 NOTE — Patient Instructions (Addendum)
Blood work was ordered.   The lab is on the first floor.    Medications changes include :   none     Return in about 1 year (around 04/19/2023) for follow up, Physical Exam.   Health Maintenance, Female Adopting a healthy lifestyle and getting preventive care are important in promoting health and wellness. Ask your health care provider about: The right schedule for you to have regular tests and exams. Things you can do on your own to prevent diseases and keep yourself healthy. What should I know about diet, weight, and exercise? Eat a healthy diet  Eat a diet that includes plenty of vegetables, fruits, low-fat dairy products, and lean protein. Do not eat a lot of foods that are high in solid fats, added sugars, or sodium. Maintain a healthy weight Body mass index (BMI) is used to identify weight problems. It estimates body fat based on height and weight. Your health care provider can help determine your BMI and help you achieve or maintain a healthy weight. Get regular exercise Get regular exercise. This is one of the most important things you can do for your health. Most adults should: Exercise for at least 150 minutes each week. The exercise should increase your heart rate and make you sweat (moderate-intensity exercise). Do strengthening exercises at least twice a week. This is in addition to the moderate-intensity exercise. Spend less time sitting. Even light physical activity can be beneficial. Watch cholesterol and blood lipids Have your blood tested for lipids and cholesterol at 69 years of age, then have this test every 5 years. Have your cholesterol levels checked more often if: Your lipid or cholesterol levels are high. You are older than 69 years of age. You are at high risk for heart disease. What should I know about cancer screening? Depending on your health history and family history, you may need to have cancer screening at various ages. This may include  screening for: Breast cancer. Cervical cancer. Colorectal cancer. Skin cancer. Lung cancer. What should I know about heart disease, diabetes, and high blood pressure? Blood pressure and heart disease High blood pressure causes heart disease and increases the risk of stroke. This is more likely to develop in people who have high blood pressure readings or are overweight. Have your blood pressure checked: Every 3-5 years if you are 47-66 years of age. Every year if you are 33 years old or older. Diabetes Have regular diabetes screenings. This checks your fasting blood sugar level. Have the screening done: Once every three years after age 37 if you are at a normal weight and have a low risk for diabetes. More often and at a younger age if you are overweight or have a high risk for diabetes. What should I know about preventing infection? Hepatitis B If you have a higher risk for hepatitis B, you should be screened for this virus. Talk with your health care provider to find out if you are at risk for hepatitis B infection. Hepatitis C Testing is recommended for: Everyone born from [birth year] through 1963. Anyone with known risk factors for hepatitis C. Sexually transmitted infections (STIs) Get screened for STIs, including gonorrhea and chlamydia, if: You are sexually active and are younger than 69 years of age. You are older than 69 years of age and your health care provider tells you that you are at risk for this type of infection. Your sexual activity has changed since you were last screened, and you  are at increased risk for chlamydia or gonorrhea. Ask your health care provider if you are at risk. Ask your health care provider about whether you are at high risk for HIV. Your health care provider may recommend a prescription medicine to help prevent HIV infection. If you choose to take medicine to prevent HIV, you should first get tested for HIV. You should then be tested every 3 months for as  long as you are taking the medicine. Pregnancy If you are about to stop having your period (premenopausal) and you may become pregnant, seek counseling before you get pregnant. Take 400 to 800 micrograms (mcg) of folic acid every day if you become pregnant. Ask for birth control (contraception) if you want to prevent pregnancy. Osteoporosis and menopause Osteoporosis is a disease in which the bones lose minerals and strength with aging. This can result in bone fractures. If you are 79 years old or older, or if you are at risk for osteoporosis and fractures, ask your health care provider if you should: Be screened for bone loss. Take a calcium or vitamin D supplement to lower your risk of fractures. Be given hormone replacement therapy (HRT) to treat symptoms of menopause. Follow these instructions at home: Alcohol use Do not drink alcohol if: Your health care provider tells you not to drink. You are pregnant, may be pregnant, or are planning to become pregnant. If you drink alcohol: Limit how much you have to: 0-1 drink a day. Know how much alcohol is in your drink. In the [place] one drink equals one 12 oz bottle of beer (355 mL), one 5 oz glass of wine (148 mL), or one 1 oz glass of hard liquor (44 mL). Lifestyle Do not use any products that contain nicotine or tobacco. These products include cigarettes, chewing tobacco, and vaping devices, such as e-cigarettes. If you need help quitting, ask your health care provider. Do not use street drugs. Do not share needles. Ask your health care provider for help if you need support or information about quitting drugs. General instructions Schedule regular health, dental, and eye exams. Stay current with your vaccines. Tell your health care provider if: You often feel depressed. You have ever been abused or do not feel safe at home. Summary Adopting a healthy lifestyle and getting preventive care are important in promoting health and  wellness. Follow your health care provider's instructions about healthy diet, exercising, and getting tested or screened for diseases. Follow your health care provider's instructions on monitoring your cholesterol and blood pressure. This information is not intended to replace advice given to you by your health care provider. Make sure you discuss any questions you have with your health care provider. Document Revised: 11/01/2020 Document Reviewed: 11/01/2020 Elsevier Patient Education  [employer].

## 2022-04-18 ENCOUNTER — Ambulatory Visit (INDEPENDENT_AMBULATORY_CARE_PROVIDER_SITE_OTHER): Payer: Medicare HMO | Admitting: Internal Medicine

## 2022-04-18 ENCOUNTER — Encounter: Payer: Self-pay | Admitting: Internal Medicine

## 2022-04-18 VITALS — BP 136/80 | HR 75 | Temp 98.4°F | Ht 65.0 in | Wt 169.0 lb

## 2022-04-18 DIAGNOSIS — I10 Essential (primary) hypertension: Secondary | ICD-10-CM

## 2022-04-18 DIAGNOSIS — E782 Mixed hyperlipidemia: Secondary | ICD-10-CM | POA: Diagnosis not present

## 2022-04-18 DIAGNOSIS — R7303 Prediabetes: Secondary | ICD-10-CM | POA: Diagnosis not present

## 2022-04-18 DIAGNOSIS — M8589 Other specified disorders of bone density and structure, multiple sites: Secondary | ICD-10-CM | POA: Diagnosis not present

## 2022-04-18 DIAGNOSIS — Z0000 Encounter for general adult medical examination without abnormal findings: Secondary | ICD-10-CM | POA: Diagnosis not present

## 2022-04-18 LAB — COMPREHENSIVE METABOLIC PANEL
ALT: 12 U/L (ref 0–35)
AST: 19 U/L (ref 0–37)
Albumin: 4.1 g/dL (ref 3.5–5.2)
Alkaline Phosphatase: 74 U/L (ref 39–117)
BUN: 16 mg/dL (ref 6–23)
CO2: 28 mEq/L (ref 19–32)
Calcium: 11.4 mg/dL — ABNORMAL HIGH (ref 8.4–10.5)
Chloride: 106 mEq/L (ref 96–112)
Creatinine, Ser: 0.79 mg/dL (ref 0.40–1.20)
GFR: 76.36 mL/min (ref >60.00)
Glucose, Bld: 124 mg/dL — ABNORMAL HIGH (ref 70–99)
Potassium: 3.6 mEq/L (ref 3.5–5.1)
Sodium: 139 mEq/L (ref 135–145)
Total Bilirubin: 0.4 mg/dL (ref 0.2–1.2)
Total Protein: 7 g/dL (ref 6.0–8.3)

## 2022-04-18 LAB — CBC WITH DIFFERENTIAL/PLATELET
Basophils Absolute: 0.1 10*3/uL (ref 0.0–0.1)
Basophils Relative: 1.1 % (ref 0.0–3.0)
Eosinophils Absolute: 0.2 10*3/uL (ref 0.0–0.7)
Eosinophils Relative: 2.9 % (ref 0.0–5.0)
HCT: 40.1 % (ref 36.0–46.0)
Hemoglobin: 13 g/dL (ref 12.0–15.0)
Lymphocytes Relative: 29.8 % (ref 12.0–46.0)
Lymphs Abs: 1.7 10*3/uL (ref 0.7–4.0)
MCHC: 32.4 g/dL (ref 30.0–36.0)
MCV: 86.1 fl (ref 78.0–100.0)
Monocytes Absolute: 0.4 10*3/uL (ref 0.1–1.0)
Monocytes Relative: 6.3 % (ref 3.0–12.0)
Neutro Abs: 3.5 10*3/uL (ref 1.4–7.7)
Neutrophils Relative %: 59.9 % (ref 43.0–77.0)
Platelets: 345 10*3/uL (ref 150.0–400.0)
RBC: 4.66 Mil/uL (ref 3.87–5.11)
RDW: 12.9 % (ref 11.5–15.5)
WBC: 5.8 10*3/uL (ref 4.0–10.5)

## 2022-04-18 LAB — LIPID PANEL
Cholesterol: 213 mg/dL — ABNORMAL HIGH (ref 0–200)
HDL: 72.6 mg/dL (ref >39.00)
LDL Cholesterol: 130 mg/dL — ABNORMAL HIGH (ref 0–99)
NonHDL: 139.91
Total CHOL/HDL Ratio: 3
Triglycerides: 50 mg/dL (ref 0.0–149.0)
VLDL: 10 mg/dL (ref 0.0–40.0)

## 2022-04-18 LAB — HEMOGLOBIN A1C: Hgb A1c MFr Bld: 5.8 % (ref 4.6–6.5)

## 2022-04-18 LAB — TSH: TSH: 0.51 u[IU]/mL (ref 0.35–5.50)

## 2022-04-18 MED ORDER — DILTIAZEM HCL ER COATED BEADS 120 MG PO CP24: 120.0000 mg | ORAL_CAPSULE | Freq: Every day | ORAL | 3 refills | Status: DC

## 2022-04-18 MED ORDER — LISINOPRIL 20 MG PO TABS: 20.0000 mg | ORAL_TABLET | Freq: Every day | ORAL | 3 refills | Status: DC

## 2022-04-18 NOTE — Assessment & Plan Note (Signed)
Chronic Check a1c Low sugar / carb diet Stressed regular exercise  

## 2022-04-18 NOTE — Assessment & Plan Note (Addendum)
Chronic Regular exercise and healthy diet encouraged Check lipid panel  Currently lifestyle controlled Discussed starting a statin given elevated ASCVD risk.  She is concerned about her cholesterol, but would prefer to be not on medication Discussed getting a CT coronary calcium score test to evaluate her risk to help [place] determine how much she needs the statin-she will think about this advise she can let me know and I can order this

## 2022-04-18 NOTE — Assessment & Plan Note (Signed)
Chronic Blood pressure well controlled CMP Continue diltiazem 120 mg daily, lisinopril 20 mg daily

## 2022-04-18 NOTE — Assessment & Plan Note (Signed)
Chronic DEXA due-ordered Stressed regular exercise Advised calcium 600 mg daily and vitamin D 1000-2000 mg twice daily

## 2022-04-24 DIAGNOSIS — Z87891 Personal history of nicotine dependence: Secondary | ICD-10-CM | POA: Diagnosis not present

## 2022-04-24 DIAGNOSIS — Z791 Long term (current) use of non-steroidal anti-inflammatories (NSAID): Secondary | ICD-10-CM | POA: Diagnosis not present

## 2022-04-24 DIAGNOSIS — Z882 Allergy status to sulfonamides status: Secondary | ICD-10-CM | POA: Diagnosis not present

## 2022-04-24 DIAGNOSIS — G8929 Other chronic pain: Secondary | ICD-10-CM | POA: Diagnosis not present

## 2022-04-24 DIAGNOSIS — I10 Essential (primary) hypertension: Secondary | ICD-10-CM | POA: Diagnosis not present

## 2022-04-24 DIAGNOSIS — H409 Unspecified glaucoma: Secondary | ICD-10-CM | POA: Diagnosis not present

## 2022-04-24 DIAGNOSIS — Z833 Family history of diabetes mellitus: Secondary | ICD-10-CM | POA: Diagnosis not present

## 2022-06-14 DIAGNOSIS — H5203 Hypermetropia, bilateral: Secondary | ICD-10-CM | POA: Diagnosis not present

## 2022-06-14 DIAGNOSIS — H52209 Unspecified astigmatism, unspecified eye: Secondary | ICD-10-CM | POA: Diagnosis not present

## 2022-06-14 DIAGNOSIS — H524 Presbyopia: Secondary | ICD-10-CM | POA: Diagnosis not present

## 2022-07-11 ENCOUNTER — Other Ambulatory Visit: Payer: Self-pay | Admitting: Internal Medicine

## 2022-07-11 DIAGNOSIS — Z1231 Encounter for screening mammogram for malignant neoplasm of breast: Secondary | ICD-10-CM

## 2022-08-14 ENCOUNTER — Ambulatory Visit (HOSPITAL_COMMUNITY)
Admission: EM | Admit: 2022-08-14 | Discharge: 2022-08-14 | Disposition: A | Discharge status: Home / Self Care | Payer: Medicare HMO | Attending: Family Medicine | Admitting: Family Medicine

## 2022-08-14 ENCOUNTER — Telehealth (HOSPITAL_COMMUNITY): Payer: Self-pay | Admitting: Emergency Medicine

## 2022-08-14 ENCOUNTER — Other Ambulatory Visit: Payer: Self-pay

## 2022-08-14 ENCOUNTER — Encounter (HOSPITAL_COMMUNITY): Payer: Self-pay | Admitting: Emergency Medicine

## 2022-08-14 DIAGNOSIS — J019 Acute sinusitis, unspecified: Secondary | ICD-10-CM | POA: Diagnosis not present

## 2022-08-14 MED ORDER — CEFDINIR 300 MG PO CAPS: 600.0000 mg | ORAL_CAPSULE | Freq: Every day | ORAL | 0 refills | Status: DC

## 2022-08-14 MED ORDER — FLUTICASONE PROPIONATE 50 MCG/ACT NA SUSP: 2.0000 | Freq: Every day | NASAL | 0 refills | Status: DC

## 2022-08-14 MED ORDER — CEFDINIR 300 MG PO CAPS: 600.0000 mg | ORAL_CAPSULE | Freq: Every day | ORAL | 0 refills | Status: AC

## 2022-08-14 NOTE — Discharge Instructions (Signed)
Take cefdinir 300 mg--2 capsules together daily for 7 days  Fluticasone/Flonase nose spray--put 2 sprays in each nostril once daily  Your [facility] pharmacy will close at 8:00 PM, but you can collect the prescriptions tomorrow when they open.

## 2022-08-14 NOTE — ED Notes (Signed)
Changed patient pharmacy at the request of patient, verified receipt received

## 2022-08-14 NOTE — ED Triage Notes (Signed)
Onset 2 weeks ago of coughing.  Then has had sneezing, clear phlegm with coughing.  Patient has been trying to rest, increase liquids.  Patient has been using sinus lavage.  Patient has a sore throat.  Patient has a congested cough.

## 2022-08-14 NOTE — ED Provider Notes (Signed)
[name]    CSN: [id number] Arrival date & time: 08/14/22  1749      History   Chief Complaint Chief Complaint  Patient presents with   Cough    HPI Angie Garcia is a 72 y.o. female.    Cough  Here for nasal congestion and cough.  Symptoms began about 2 weeks ago.  She had a lot of nasal congestion and cough at first and started using nasal saline washes.  Those did help some, but then 1 week ago she started having increased postnasal drainage and some more cough.  The nasal congestion actually improved a little bit.  She has not had fever or chills at any point.  No nausea or vomiting or diarrhea.  She has not had any shortness of breath.  Past Medical History:  Diagnosis Date   Allergy    Arthritis    Baker's cyst    Left knee   Hx of adenomatous polyp of colon 05/09/2017   Hypertension    Lactose intolerance    Shingles 10/2008   RUE    Patient Active Problem List   Diagnosis Date Noted   Osteopenia 04/10/2021   Hyperparathyroidism, primary ([place]) 03/14/2018   Multinodular goiter 03/14/2018   Hx of adenomatous polyp of colon 05/09/2017   Cyst of left kidney 10/21/2015   Prediabetes 08/19/2015   Hyperlipidemia 06/02/2014   DJD (degenerative joint disease) of knee 04/01/2013   Hemorrhoids, internal, with bleeding 07/04/2011   LACTOSE INTOLERANCE 11/18/2009   RHINITIS 11/18/2009   History of herpes zoster 10/28/2008   Essential hypertension 01/24/2008    Past Surgical History:  Procedure Laterality Date   ABDOMINAL HYSTERECTOMY     Dr Connye Burkitt for fibroids   COLONOSCOPY  12/11/2005   lymphoid aggregate removed (suspected polyp was not), melanosis coli   PILONIDAL CYSTECTOMY     spine cyst    OB History   No obstetric history on file.      Home Medications    Prior to Admission medications   Medication Sig Start Date End Date Taking? Authorizing Provider  cefdinir (OMNICEF) 300 MG capsule Take 2 capsules (600 mg total) by mouth  daily for 7 days. 08/14/22 08/21/22 Yes Barrett Henle, MD  fluticasone (FLONASE) 50 MCG/ACT nasal spray Place 2 sprays into both nostrils daily. 08/14/22  Yes Barrett Henle, MD  acetaminophen (TYLENOL) 650 MG CR tablet Take 650 mg by mouth every 8 (eight) hours as needed for pain.    [provider]  diltiazem (CARDIZEM CD) 120 MG 24 hr capsule Take 1 capsule (120 mg total) by mouth daily. 04/18/22   Burns, Claudina Lick, MD  latanoprost (XALATAN) 0.005 % ophthalmic solution 1 drop at bedtime. 03/04/20   [provider]  lisinopril (ZESTRIL) 20 MG tablet Take 1 tablet (20 mg total) by mouth daily. 04/18/22   Binnie Rail, MD  naproxen sodium (ALEVE) 220 MG tablet Take 220 mg by mouth.    [provider]  RHOPRESSA 0.02 % SOLN Apply 1 drop to eye at bedtime. 03/16/20   [provider]  TURMERIC PO Take by mouth.    [provider]    Family History Family History  Problem Relation Age of Onset   Hypertension Mother    Breast cancer Mother        5s   Diabetes Father    Stroke Father        MINI STROKES   Stroke Sister 83  Diabetes Sister    Cancer Maternal Uncle        UNKNOWN TYPE   Colon cancer Neg Hx    Esophageal cancer Neg Hx    Pancreatic cancer Neg Hx    Rectal cancer Neg Hx    Stomach cancer Neg Hx    Hyperparathyroidism Neg Hx     Social History Social History   Tobacco Use   Smoking status: Former    Types: Cigarettes    Quit date: 06/27/1995    Years since quitting: 27.1   Smokeless tobacco: Never   Tobacco comments:    smoked [age], up to < 1 ppd  Vaping Use   Vaping Use: Never used  Substance Use Topics   Alcohol use: No   Drug use: No     Allergies   Sulfamethoxazole-trimethoprim   Review of Systems Review of Systems  Respiratory:  Positive for cough.      Physical Exam Triage Vital Signs ED Triage Vitals  Enc Vitals Group     BP 08/14/22 1926 (!) 153/84     Pulse Rate 08/14/22 1926 65      Resp 08/14/22 1926 18     Temp 08/14/22 1926 97.9 F (36.6 C)     Temp Source 08/14/22 1926 Oral     SpO2 08/14/22 1926 97 %     Weight --      Height --      Head Circumference --      Peak Flow --      Pain Score 08/14/22 1924 5     Pain Loc --      Pain Edu? --      Excl. in [place]? --    No data found.  Updated Vital Signs BP (!) 153/84 (BP Location: Left Arm)   Pulse 65   Temp 97.9 F (36.6 C) (Oral)   Resp 18   SpO2 97%   Visual Acuity Right Eye Distance:   Left Eye Distance:   Bilateral Distance:    Right Eye Near:   Left Eye Near:    Bilateral Near:     Physical Exam Vitals reviewed.  Constitutional:      General: She is not in acute distress.    Appearance: She is not toxic-appearing.  HENT:     Right Ear: Tympanic membrane and ear canal normal.     Left Ear: Tympanic membrane and ear canal normal.     Nose: Nose normal.     Mouth/Throat:     Mouth: Mucous membranes are moist.     Pharynx: No oropharyngeal exudate or posterior oropharyngeal erythema.  Eyes:     Extraocular Movements: Extraocular movements intact.     Conjunctiva/sclera: Conjunctivae normal.     Pupils: Pupils are equal, round, and reactive to light.  Cardiovascular:     Rate and Rhythm: Normal rate and regular rhythm.     Heart sounds: No murmur heard. Pulmonary:     Effort: Pulmonary effort is normal. No respiratory distress.     Breath sounds: No stridor. No wheezing, rhonchi or rales.  Musculoskeletal:     Cervical back: Neck supple.  Lymphadenopathy:     Cervical: No cervical adenopathy.  Skin:    Capillary Refill: Capillary refill takes less than 2 seconds.     Coloration: Skin is not jaundiced or pale.  Neurological:     General: No focal deficit present.     Mental Status: She is alert and oriented to person, place,  and time.  Psychiatric:        Behavior: Behavior normal.      UC Treatments / Results  Labs (all labs ordered are listed, but only abnormal results are  displayed) Labs Reviewed - No data to display  EKG   Radiology No results found.  Procedures Procedures (including critical care time)  Medications Ordered in UC Medications - No data to display  Initial Impression / Assessment and Plan / UC Course  I have reviewed the triage vital signs and the nursing notes.  Pertinent labs & imaging results that were available during my care of the patient were reviewed by me and considered in my medical decision making (see chart for details).        I am going to treat with antibiotics for her acute sinusitis, since she has had symptoms for 2 weeks, and has had a double sickening pattern Final Clinical Impressions(s) / UC Diagnoses   Final diagnoses:  Acute sinusitis, recurrence not specified, unspecified location     Discharge Instructions      Take cefdinir 300 mg--2 capsules together daily for 7 days  Fluticasone/Flonase nose spray--put 2 sprays in each nostril once daily  Your [facility] pharmacy will close at 8:00 PM, but you can collect the prescriptions tomorrow when they open.     ED Prescriptions     Medication Sig Dispense Auth. Provider   cefdinir (OMNICEF) 300 MG capsule Take 2 capsules (600 mg total) by mouth daily for 7 days. 14 capsule Barrett Henle, MD   fluticasone [facility]) 50 MCG/ACT nasal spray Place 2 sprays into both nostrils daily. 16 g Barrett Henle, MD      PDMP not reviewed this encounter.   Barrett Henle, MD 08/14/22 [phone]

## 2022-08-23 ENCOUNTER — Ambulatory Visit: Payer: Medicare HMO

## 2022-08-29 ENCOUNTER — Ambulatory Visit
Admission: RE | Admit: 2022-08-29 | Discharge: 2022-08-29 | Disposition: A | Discharge status: Home / Self Care | Payer: Medicare HMO | Source: Ambulatory Visit | Attending: Internal Medicine | Admitting: Internal Medicine

## 2022-08-29 DIAGNOSIS — Z1231 Encounter for screening mammogram for malignant neoplasm of breast: Secondary | ICD-10-CM | POA: Diagnosis not present

## 2022-08-31 ENCOUNTER — Other Ambulatory Visit: Payer: Self-pay | Admitting: Internal Medicine

## 2022-08-31 DIAGNOSIS — R928 Other abnormal and inconclusive findings on diagnostic imaging of breast: Secondary | ICD-10-CM

## 2022-09-14 ENCOUNTER — Ambulatory Visit
Admission: RE | Admit: 2022-09-14 | Discharge: 2022-09-14 | Disposition: A | Discharge status: Home / Self Care | Payer: Medicare HMO | Source: Ambulatory Visit | Attending: Internal Medicine | Admitting: Internal Medicine

## 2022-09-14 ENCOUNTER — Other Ambulatory Visit: Payer: Self-pay | Admitting: Internal Medicine

## 2022-09-14 DIAGNOSIS — N6489 Other specified disorders of breast: Secondary | ICD-10-CM | POA: Diagnosis not present

## 2022-09-14 DIAGNOSIS — R928 Other abnormal and inconclusive findings on diagnostic imaging of breast: Secondary | ICD-10-CM

## 2022-09-14 DIAGNOSIS — R599 Enlarged lymph nodes, unspecified: Secondary | ICD-10-CM

## 2022-09-14 DIAGNOSIS — N6320 Unspecified lump in the left breast, unspecified quadrant: Secondary | ICD-10-CM

## 2022-09-18 ENCOUNTER — Ambulatory Visit
Admission: RE | Admit: 2022-09-18 | Discharge: 2022-09-18 | Disposition: A | Discharge status: Home / Self Care | Payer: Medicare HMO | Source: Ambulatory Visit | Attending: Internal Medicine | Admitting: Internal Medicine

## 2022-09-18 DIAGNOSIS — R590 Localized enlarged lymph nodes: Secondary | ICD-10-CM | POA: Diagnosis not present

## 2022-09-18 DIAGNOSIS — R599 Enlarged lymph nodes, unspecified: Secondary | ICD-10-CM

## 2022-09-18 DIAGNOSIS — N6322 Unspecified lump in the left breast, upper inner quadrant: Secondary | ICD-10-CM | POA: Diagnosis not present

## 2022-09-18 DIAGNOSIS — R928 Other abnormal and inconclusive findings on diagnostic imaging of breast: Secondary | ICD-10-CM

## 2022-09-18 DIAGNOSIS — N6320 Unspecified lump in the left breast, unspecified quadrant: Secondary | ICD-10-CM

## 2022-09-18 HISTORY — PX: BREAST BIOPSY: SHX20

## 2022-09-19 ENCOUNTER — Telehealth: Payer: Self-pay | Admitting: Hematology and Oncology

## 2022-09-19 ENCOUNTER — Telehealth: Payer: Self-pay | Admitting: Internal Medicine

## 2022-09-19 DIAGNOSIS — C50912 Malignant neoplasm of unspecified site of left female breast: Secondary | ICD-10-CM

## 2022-09-19 NOTE — Telephone Encounter (Signed)
Patient called and would like a referral to [facility] breast center.  Please call patient  [phone]

## 2022-09-19 NOTE — Telephone Encounter (Signed)
Referral ordered for [facility]

## 2022-09-19 NOTE — Telephone Encounter (Signed)
Spoke to patient to confirm upcoming morning [facility] clinic appointment on 4/3, paperwork will be sent via e-mail.   Gave location and time, also informed patient that the surgeon's office would be calling as well to get information from them similar to the packet that they will be receiving so make sure to do both.  Reminded patient that all providers will be coming to the clinic to see them HERE and if they had any questions to not hesitate to reach back out to myself or their navigators.

## 2022-09-20 NOTE — Telephone Encounter (Signed)
Pt want to disregard the referral.

## 2022-09-20 NOTE — Telephone Encounter (Signed)
Called pt no answer LMOM w/MD response../lmb 

## 2022-09-21 ENCOUNTER — Other Ambulatory Visit: Payer: Medicare HMO

## 2022-09-25 ENCOUNTER — Encounter: Payer: Self-pay | Admitting: *Deleted

## 2022-09-25 DIAGNOSIS — Z170 Estrogen receptor positive status [ER+]: Secondary | ICD-10-CM | POA: Insufficient documentation

## 2022-09-26 NOTE — Progress Notes (Signed)
Radiation Oncology         [phone] ________________________________  Multidisciplinary Breast Oncology Clinic [facility]) Initial Outpatient Consultation  Name: Angie Garcia MRN: [id number]  Date: 09/27/2022  DOB: September 20, 1950  IE:PPIRJ, Bobette Mo, MD  Emelia Loron, MD   REFERRING PHYSICIAN: Emelia Loron, MD  DIAGNOSIS: The encounter diagnosis was Malignant neoplasm of upper-inner quadrant of left breast in female, estrogen receptor positive.  Stage IA (cT1c, cN1, cM0) Left Breast UIQ, Invasive ductal carcinoma with axillary nodal involvement, ER+ / PR+ / Her2-, Grade 2    ICD-10-CM   1. Malignant neoplasm of upper-inner quadrant of left breast in female, estrogen receptor positive  C50.212    Z17.0       HISTORY OF PRESENT ILLNESS::Angie Garcia is a 72 y.o. female who is presenting to the office today for evaluation of her newly diagnosed breast cancer. She is accompanied by her husband. She is doing well overall.   She had routine screening mammography on 08/29/22 showing a possible abnormality in the left breast. She underwent unilateral left breast diagnostic mammography with tomography and left breast ultrasonography at [facility] on 09/14/22 showing: a suspicious mass in the 9 o'clock left breast, 2 cmfn, measuring 1.8 cm. [place] also revealed 1 abnormal left axillary lymph node measuring approximately 8 mm in cortical thickness.   Biopsy of the 9 o'clock left breast on 09/18/22 showed: grade 2 invasive mammary/ductal carcinoma measuring 10 mm in the greatest linear extent of the sample. Prognostic indicators significant for: estrogen receptor, 100% positive with strong staining intensity and progesterone receptor, 75% positive with moderate-strong staining intensity. Proliferation marker Ki67 at 2%. HER2 negative.  Biopsy of the single abnormal left axillar lymph node showed metastatic carcinoma without any nodal tissue present of the submitted specimen. Prognostic  indicators significant for: estrogen receptor, 100% positive with strong staining intensity and progesterone receptor, 30% positive with moderate-strong staining intensity. Proliferation marker Ki67 at 5%. HER2 negative.  Menarche: 72 years old Age at first live birth: 72 years old GP: 1 LMP: menopausal (LMP date not indicated on the provided form) Contraceptive: never used HRT: never used   The patient was referred today for presentation in the multidisciplinary conference.  Radiology studies and pathology slides were presented there for review and discussion of treatment options.  A consensus was discussed regarding potential next steps.  PREVIOUS RADIATION THERAPY: No  PAST MEDICAL HISTORY:  Past Medical History:  Diagnosis Date   Allergy    Arthritis    Baker's cyst    Left knee   Breast cancer    Hx of adenomatous polyp of colon 05/09/2017   Hypertension    Lactose intolerance    Shingles 10/2008   RUE    PAST SURGICAL HISTORY: Past Surgical History:  Procedure Laterality Date   ABDOMINAL HYSTERECTOMY     Dr Roberto Scales for fibroids   BREAST BIOPSY Left 09/18/2022   [place] LT BREAST BX W LOC DEV 1ST LESION IMG BX SPEC US GUIDE 09/18/2022 [facility]   COLONOSCOPY  12/11/2005   lymphoid aggregate removed (suspected polyp was not), melanosis coli   PILONIDAL CYSTECTOMY     spine cyst    FAMILY HISTORY:  Family History  Problem Relation Age of Onset   Hypertension Mother    Breast cancer Mother        33s   Diabetes Father    Stroke Father        MINI STROKES   Stroke Sister 66  Diabetes Sister    Cancer Maternal Uncle        UNKNOWN TYPE   Colon cancer Neg Hx    Esophageal cancer Neg Hx    Pancreatic cancer Neg Hx    Rectal cancer Neg Hx    Stomach cancer Neg Hx    Hyperparathyroidism Neg Hx     SOCIAL HISTORY:  Social History   Socioeconomic History   Marital status: Married    Spouse name: [name]   Number of children: 1   Years of education: Not on  file   Highest education level: Not on file  Occupational History   Occupation: retired    Associate Professor: [employer]  Tobacco Use   Smoking status: Former    Types: Cigarettes    Quit date: 06/27/1995    Years since quitting: 27.2   Smokeless tobacco: Never   Tobacco comments:    smoked [age], up to < 1 ppd  Vaping Use   Vaping Use: Never used  Substance and Sexual Activity   Alcohol use: No   Drug use: No   Sexual activity: Yes  Other Topics Concern   Not on file  Social History Narrative   REGULAR EXERCISE   Social Determinants of Health   Financial Resource Strain: Low Risk  (04/05/2022)   Overall Financial Resource Strain (CARDIA)    Difficulty of Paying Living Expenses: Not hard at all  Food Insecurity: No Food Insecurity (04/05/2022)   Hunger Vital Sign    Worried About Running Out of Food in the Last Year: Never true    Ran Out of Food in the Last Year: Never true  Transportation Needs: No Transportation Needs (04/05/2022)   PRAPARE - Administrator, Civil Service (Medical): No    Lack of Transportation (Non-Medical): No  Physical Activity: Sufficiently Active (04/05/2022)   Exercise Vital Sign    Days of Exercise per Week: 5 days    Minutes of Exercise per Session: 30 min  Stress: No Stress Concern Present (04/05/2022)   [employer] of Occupational Health - Occupational Stress Questionnaire    Feeling of Stress : Not at all  Social Connections: Socially Integrated (04/05/2022)   Social Connection and Isolation Panel [NHANES]    Frequency of Communication with Friends and Family: More than three times a week    Frequency of Social Gatherings with Friends and Family: More than three times a week    Attends Religious Services: More than 4 times per year    Active Member of [employer] or Organizations: Yes    Attends Engineer, structural: More than 4 times per year    Marital Status: Married    ALLERGIES:  Allergies  Allergen Reactions    Sulfamethoxazole-Trimethoprim     Rash Because of a history of documented adverse serious drug reaction;Medi Alert bracelet  is recommended    MEDICATIONS:  Current Outpatient Medications  Medication Sig Dispense Refill   acetaminophen (TYLENOL) 650 MG CR tablet Take 650 mg by mouth every 8 (eight) hours as needed for pain.     cetirizine (ZYRTEC) 10 MG tablet Take 10 mg by mouth daily.     diltiazem (CARDIZEM CD) 120 MG 24 hr capsule Take 1 capsule (120 mg total) by mouth daily. 90 capsule 3   lisinopril (ZESTRIL) 20 MG tablet Take 1 tablet (20 mg total) by mouth daily. 90 tablet 3   TURMERIC PO Take by mouth.     No current facility-administered medications for this  encounter.    REVIEW OF SYSTEMS: A 10+ POINT REVIEW OF SYSTEMS WAS OBTAINED including neurology, dermatology, psychiatry, cardiac, respiratory, lymph, extremities, GI, GU, musculoskeletal, constitutional, reproductive, HEENT. On the provided form, she reports wearing glasses. She denies any other symptoms.    PHYSICAL EXAM:     09/27/2022  Vitals with BMI   Height 5\' 5"    Weight 168 lbs 13 oz   BMI 28.09   Systolic 144 !   Diastolic 86 !   Pulse 84     Legend:  Lungs are clear to auscultation bilaterally. Heart has regular rate and rhythm. No palpable cervical, supraclavicular, or axillary adenopathy. Abdomen soft, non-tender, normal bowel sounds. Breast: Right breast with no palpable mass, nipple discharge, or bleeding. Left breast with some bruising in the lower aspect of the breast and a biopsy site lesion present in the upper outer quadrant. There is no palpable mass, nipple discharge or bleeding.   KPS = 100  100 - Normal; no complaints; no evidence of disease. 90   - Able to carry on normal activity; minor signs or symptoms of disease. 80   - Normal activity with effort; some signs or symptoms of disease. 80   - Cares for self; unable to carry on normal activity or to do active work. 60   - Requires  occasional assistance, but is able to care for most of his personal needs. 50   - Requires considerable assistance and frequent medical care. 40   - Disabled; requires special care and assistance. 30   - Severely disabled; hospital admission is indicated although death not imminent. 20   - Very sick; hospital admission necessary; active supportive treatment necessary. 10   - Moribund; fatal processes progressing rapidly. 0     - Dead  Karnofsky DA, Abelmann WH, Craver LS and Burchenal [facility] [phone]) The use of the nitrogen mustards in the palliative treatment of carcinoma: with particular reference to bronchogenic carcinoma Cancer 1 [id number]  LABORATORY DATA:  Lab Results  Component Value Date   WBC 5.7 09/27/2022   HGB 13.4 09/27/2022   HCT 41.7 09/27/2022   MCV 87.2 09/27/2022   PLT 369 09/27/2022   Lab Results  Component Value Date   NA 142 09/27/2022   K 3.9 09/27/2022   CL 110 09/27/2022   CO2 26 09/27/2022   Lab Results  Component Value Date   ALT 11 09/27/2022   AST 13 (L) 09/27/2022   ALKPHOS 77 09/27/2022   BILITOT 0.3 09/27/2022    PULMONARY FUNCTION TEST:   Review Flowsheet        No data to display          RADIOGRAPHY: [place] LT BREAST BX W LOC DEV 1ST LESION IMG BX SPEC US GUIDE  Addendum Date: 09/19/2022   ADDENDUM REPORT: 09/19/2022 15:04 ADDENDUM: Pathology revealed GRADE II INVASIVE MAMMARY CARCINOMA of the LEFT breast, 9:00 o'clock, 2 cmfn, (ribbon clip). This was found to be concordant by Dr. Annia Belt. Pathology revealed METASTATIC CARCINOMA of the RIGHT axilla, (hydromark clip). THERE IS ESSENTIALLY NO LYMPHOID TISSUE PRESENT ON THE BIOPSY SPECIMEN. This was found to be concordant by Dr. Annia Belt. Pathology results were discussed with the patient by telephone. The patient reported doing well after the biopsies with tenderness at the sites. Post biopsy instructions and care were reviewed and questions were answered. The patient was encouraged to call [facility] for any additional concerns. My direct phone number was provided.  The patient was referred to [facility] on September 27, 2022. Pathology results reported by Rene Kocher, RN on 09/19/2022. Electronically Signed   By: Annia Belt M.D.   On: 09/19/2022 15:04   Result Date: 09/19/2022 CLINICAL DATA:  Suspicious left breast mass and thickened left axillary lymph node EXAM: ULTRASOUND GUIDED LEFT BREAST CORE NEEDLE BIOPSY COMPARISON:  Previous exam(s). PROCEDURE: I met with the patient and we discussed the procedure of ultrasound-guided biopsy, including benefits and alternatives. We discussed the high likelihood of a successful procedure. We discussed the risks of the procedure, including infection, bleeding, tissue injury, clip migration, and inadequate sampling. Informed Written consent was given. The usual time-out protocol was performed immediately prior to the procedure. Site 1: Left breast mass 9 o'clock position: Lesion quadrant: Upper inner quadrant Using sterile technique and 1% Lidocaine as local anesthetic, under direct ultrasound visualization, a 14 gauge spring-loaded device was used to perform biopsy of left breast mass 9 o'clock position using a medial approach. At the conclusion of the procedure ribbon shaped tissue marker clip was deployed into the biopsy cavity. Follow up 2 view mammogram was performed and dictated separately. Site 2: Left axillary lymph node: Lesion quadrant: Upper outer quadrant Using sterile technique and 1% Lidocaine as local anesthetic, under direct ultrasound visualization, a 14 gauge spring-loaded device was used to perform biopsy of left axillary lymph node using a lateral approach. At the conclusion of the procedure [facility] shaped tissue marker clip was deployed into the biopsy cavity. Follow up 2 view mammogram was performed and dictated separately. IMPRESSION:  Ultrasound guided biopsy of left breast mass 9 o'clock position and left axillary lymph node. No apparent complications. Electronically Signed: By: Annia Belt M.D. On: 09/18/2022 14:33  [place] AXILLARY NODE CORE BIOPSY LEFT  Addendum Date: 09/19/2022   ADDENDUM REPORT: 09/19/2022 15:04 ADDENDUM: Pathology revealed GRADE II INVASIVE MAMMARY CARCINOMA of the LEFT breast, 9:00 o'clock, 2 cmfn, (ribbon clip). This was found to be concordant by Dr. Annia Belt. Pathology revealed METASTATIC CARCINOMA of the RIGHT axilla, (hydromark clip). THERE IS ESSENTIALLY NO LYMPHOID TISSUE PRESENT ON THE BIOPSY SPECIMEN. This was found to be concordant by Dr. Annia Belt. Pathology results were discussed with the patient by telephone. The patient reported doing well after the biopsies with tenderness at the sites. Post biopsy instructions and care were reviewed and questions were answered. The patient was encouraged to call [facility] for any additional concerns. My direct phone number was provided. The patient was referred to [facility] at [facility] on September 27, 2022. Pathology results reported by Rene Kocher, RN on 09/19/2022. Electronically Signed   By: Annia Belt M.D.   On: 09/19/2022 15:04   Result Date: 09/19/2022 CLINICAL DATA:  Suspicious left breast mass and thickened left axillary lymph node EXAM: ULTRASOUND GUIDED LEFT BREAST CORE NEEDLE BIOPSY COMPARISON:  Previous exam(s). PROCEDURE: I met with the patient and we discussed the procedure of ultrasound-guided biopsy, including benefits and alternatives. We discussed the high likelihood of a successful procedure. We discussed the risks of the procedure, including infection, bleeding, tissue injury, clip migration, and inadequate sampling. Informed Written consent was given. The usual time-out protocol was performed immediately prior to the procedure. Site 1: Left breast mass 9  o'clock position: Lesion quadrant: Upper inner quadrant Using sterile technique and 1% Lidocaine as local anesthetic, under direct ultrasound visualization,  a 14 gauge spring-loaded device was used to perform biopsy of left breast mass 9 o'clock position using a medial approach. At the conclusion of the procedure ribbon shaped tissue marker clip was deployed into the biopsy cavity. Follow up 2 view mammogram was performed and dictated separately. Site 2: Left axillary lymph node: Lesion quadrant: Upper outer quadrant Using sterile technique and 1% Lidocaine as local anesthetic, under direct ultrasound visualization, a 14 gauge spring-loaded device was used to perform biopsy of left axillary lymph node using a lateral approach. At the conclusion of the procedure [facility] shaped tissue marker clip was deployed into the biopsy cavity. Follow up 2 view mammogram was performed and dictated separately. IMPRESSION: Ultrasound guided biopsy of left breast mass 9 o'clock position and left axillary lymph node. No apparent complications. Electronically Signed: By: Annia Belt M.D. On: 09/18/2022 14:33  MM CLIP PLACEMENT LEFT  Result Date: 09/18/2022 CLINICAL DATA:  Status post ultrasound biopsy left breast mass and left axillary lymph node EXAM: 3D DIAGNOSTIC LEFT MAMMOGRAM POST ULTRASOUND BIOPSY COMPARISON:  Previous exam(s). FINDINGS: 3D Mammographic images were obtained following ultrasound guided biopsy of left breast mass 9 o'clock position and left axillary lymph node. Site 1: Left breast mass 9 o'clock position: Ribbon shaped clip: In appropriate position. Site 2: Left axillary lymph node: HydroMARK clip: In appropriate position. IMPRESSION: Appropriate positioning of the biopsy marking clips as above. Final Assessment: Post Procedure Mammograms for Marker Placement Electronically Signed   By: Annia Belt M.D.   On: 09/18/2022 14:58  MM 3D DIAGNOSTIC MAMMOGRAM UNILATERAL LEFT BREAST  Result Date:  09/14/2022 CLINICAL DATA:  72 year old female presenting as a recall from screening for possible left breast mass. EXAM: DIGITAL DIAGNOSTIC UNILATERAL LEFT MAMMOGRAM WITH TOMOSYNTHESIS; ULTRASOUND LEFT BREAST LIMITED TECHNIQUE: Left digital diagnostic mammography and breast tomosynthesis was performed.; Targeted ultrasound examination of the left breast was performed. COMPARISON:  Previous exam(s). ACR Breast Density Category b: There are scattered areas of fibroglandular density. FINDINGS: Mammogram: Left breast: Spot compression tomosynthesis views of the left breast were performed demonstrating persistence of an irregular mass with spiculation in the medial breast measuring approximately 1.5 cm. On physical exam of the medial left breast I do not feel a discrete mass or focal area of thickening. Ultrasound: Targeted ultrasound performed in the left breast at 9 o'clock 2 cm from the nipple demonstrating an irregular hypoechoic mass measuring 1.8 x 0.7 x 1.2 cm. This corresponds to the mammographic finding. Targeted ultrasound of the left axilla demonstrates 1 abnormal lymph node with cortical thickness measuring 0.8 cm. IMPRESSION: 1. Suspicious mass in the left breast at 9 o'clock measuring 1.8 cm. 2.  Suspicious left axillary lymph node. RECOMMENDATION: Ultrasound-guided core needle biopsy of the left breast mass at 9 o'clock and ultrasound-guided core needle biopsy of the left axillary lymph node. I have discussed the findings and recommendations with the patient. If applicable, a reminder letter will be sent to the patient regarding the next appointment. BI-RADS CATEGORY  4: Suspicious. Electronically Signed   By: Emmaline Kluver M.D.   On: 09/14/2022 15:22  [place] LIMITED ULTRASOUND INCLUDING AXILLA LEFT BREAST   Result Date: 09/14/2022 CLINICAL DATA:  72 year old female presenting as a recall from screening for possible left breast mass. EXAM: DIGITAL DIAGNOSTIC UNILATERAL LEFT MAMMOGRAM WITH  TOMOSYNTHESIS; ULTRASOUND LEFT BREAST LIMITED TECHNIQUE: Left digital diagnostic mammography and breast tomosynthesis was performed.; Targeted ultrasound examination of the left breast was performed. COMPARISON:  Previous exam(s). ACR Breast Density Category b: There are  scattered areas of fibroglandular density. FINDINGS: Mammogram: Left breast: Spot compression tomosynthesis views of the left breast were performed demonstrating persistence of an irregular mass with spiculation in the medial breast measuring approximately 1.5 cm. On physical exam of the medial left breast I do not feel a discrete mass or focal area of thickening. Ultrasound: Targeted ultrasound performed in the left breast at 9 o'clock 2 cm from the nipple demonstrating an irregular hypoechoic mass measuring 1.8 x 0.7 x 1.2 cm. This corresponds to the mammographic finding. Targeted ultrasound of the left axilla demonstrates 1 abnormal lymph node with cortical thickness measuring 0.8 cm. IMPRESSION: 1. Suspicious mass in the left breast at 9 o'clock measuring 1.8 cm. 2.  Suspicious left axillary lymph node. RECOMMENDATION: Ultrasound-guided core needle biopsy of the left breast mass at 9 o'clock and ultrasound-guided core needle biopsy of the left axillary lymph node. I have discussed the findings and recommendations with the patient. If applicable, a reminder letter will be sent to the patient regarding the next appointment. BI-RADS CATEGORY  4: Suspicious. Electronically Signed   By: Emmaline Kluver M.D.   On: 09/14/2022 15:22  MM 3D SCREEN BREAST BILATERAL  Result Date: 08/30/2022 CLINICAL DATA:  Screening. EXAM: DIGITAL SCREENING BILATERAL MAMMOGRAM WITH TOMOSYNTHESIS AND CAD TECHNIQUE: Bilateral screening digital craniocaudal and mediolateral oblique mammograms were obtained. Bilateral screening digital breast tomosynthesis was performed. The images were evaluated with computer-aided detection. COMPARISON:  Previous exam(s). ACR Breast  Density Category b: There are scattered areas of fibroglandular density. FINDINGS: In the left breast, a possible mass warrants further evaluation. In the right breast, no findings suspicious for malignancy. IMPRESSION: Further evaluation is suggested for a possible mass in the left breast. RECOMMENDATION: Diagnostic mammogram and possibly ultrasound of the left breast. (Code:[id number]) The patient will be contacted regarding the findings, and additional imaging will be scheduled. BI-RADS CATEGORY  0: Incomplete: Need additional imaging evaluation. Electronically Signed   By: Sherian Rein M.D.   On: 08/30/2022 14:38      IMPRESSION: Stage IA (cT1c, cN1, cM0) Left Breast UIQ, Invasive ductal carcinoma with axillary nodal involvement, ER+ / PR+ / Her2-, Grade 2  Patient will be a good candidate for breast conservation with radiotherapy to the left breast. The patient discussed with [place] today that she may want to do undergo mastectomies due to the significant stress it causes her being called back for abnormal mammograms. We discussed the general course of radiation, potential side effects, and toxicities with radiation and the patient is interested in receiving radiation therapy either in the breast conserving approach or postmastectomy.   PLAN:  Genetics  Lumpectomy with TAD vs bilateral mastectomies  Oncotype Dx Adjuvant radiation therapy  Aromatase inhibitor    ------------------------------------------------  Billie Lade, PhD, MD  This document serves as a record of services personally performed by Antony Blackbird, MD. It was created on his behalf by Neena Rhymes, a trained medical scribe. The creation of this record is based on the scribe's personal observations and the provider's statements to them. This document has been checked and approved by the attending provider.

## 2022-09-27 ENCOUNTER — Encounter: Payer: Self-pay | Admitting: Hematology and Oncology

## 2022-09-27 ENCOUNTER — Inpatient Hospital Stay: Payer: Medicare HMO

## 2022-09-27 ENCOUNTER — Other Ambulatory Visit: Payer: Self-pay

## 2022-09-27 ENCOUNTER — Other Ambulatory Visit: Payer: Self-pay | Admitting: General Surgery

## 2022-09-27 ENCOUNTER — Inpatient Hospital Stay: Payer: Medicare HMO | Admitting: Licensed Clinical Social Worker

## 2022-09-27 ENCOUNTER — Ambulatory Visit
Admission: RE | Admit: 2022-09-27 | Discharge: 2022-09-27 | Disposition: A | Discharge status: Home / Self Care | Payer: Medicare HMO | Source: Ambulatory Visit | Attending: Radiation Oncology | Admitting: Radiation Oncology

## 2022-09-27 ENCOUNTER — Telehealth: Payer: Self-pay | Admitting: Genetic Counselor

## 2022-09-27 ENCOUNTER — Inpatient Hospital Stay: Payer: Medicare HMO | Attending: Hematology and Oncology | Admitting: Hematology and Oncology

## 2022-09-27 VITALS — BP 144/86 | HR 84 | Temp 97.7°F | Resp 18 | Ht 65.0 in | Wt 168.8 lb

## 2022-09-27 DIAGNOSIS — Z79899 Other long term (current) drug therapy: Secondary | ICD-10-CM | POA: Diagnosis not present

## 2022-09-27 DIAGNOSIS — Z79810 Long term (current) use of selective estrogen receptor modulators (SERMs): Secondary | ICD-10-CM | POA: Diagnosis not present

## 2022-09-27 DIAGNOSIS — Z170 Estrogen receptor positive status [ER+]: Secondary | ICD-10-CM

## 2022-09-27 DIAGNOSIS — Z86010 Personal history of colonic polyps: Secondary | ICD-10-CM | POA: Insufficient documentation

## 2022-09-27 DIAGNOSIS — Z803 Family history of malignant neoplasm of breast: Secondary | ICD-10-CM | POA: Diagnosis not present

## 2022-09-27 DIAGNOSIS — I10 Essential (primary) hypertension: Secondary | ICD-10-CM | POA: Diagnosis not present

## 2022-09-27 DIAGNOSIS — C50212 Malignant neoplasm of upper-inner quadrant of left female breast: Secondary | ICD-10-CM | POA: Diagnosis not present

## 2022-09-27 DIAGNOSIS — Z87891 Personal history of nicotine dependence: Secondary | ICD-10-CM | POA: Insufficient documentation

## 2022-09-27 LAB — CBC WITH DIFFERENTIAL (CANCER CENTER ONLY)
Abs Immature Granulocytes: 0.02 10*3/uL (ref 0.00–0.07)
Basophils Absolute: 0.1 10*3/uL (ref 0.0–0.1)
Basophils Relative: 1 %
Eosinophils Absolute: 0.1 10*3/uL (ref 0.0–0.5)
Eosinophils Relative: 1 %
HCT: 41.7 % (ref 36.0–46.0)
Hemoglobin: 13.4 g/dL (ref 12.0–15.0)
Immature Granulocytes: 0 %
Lymphocytes Relative: 25 %
Lymphs Abs: 1.4 10*3/uL (ref 0.7–4.0)
MCH: 28 pg (ref 26.0–34.0)
MCHC: 32.1 g/dL (ref 30.0–36.0)
MCV: 87.2 fL (ref 80.0–100.0)
Monocytes Absolute: 0.6 10*3/uL (ref 0.1–1.0)
Monocytes Relative: 11 %
Neutro Abs: 3.5 10*3/uL (ref 1.7–7.7)
Neutrophils Relative %: 62 %
Platelet Count: 369 10*3/uL (ref 150–400)
RBC: 4.78 MIL/uL (ref 3.87–5.11)
RDW: 12.5 % (ref 11.5–15.5)
WBC Count: 5.7 10*3/uL (ref 4.0–10.5)
nRBC: 0 % (ref 0.0–0.2)

## 2022-09-27 LAB — GENETIC SCREENING ORDER

## 2022-09-27 LAB — CMP (CANCER CENTER ONLY)
ALT: 11 U/L (ref 0–44)
AST: 13 U/L — ABNORMAL LOW (ref 15–41)
Albumin: 4.1 g/dL (ref 3.5–5.0)
Alkaline Phosphatase: 77 U/L (ref 38–126)
Anion gap: 6 (ref 5–15)
BUN: 19 mg/dL (ref 8–23)
CO2: 26 mmol/L (ref 22–32)
Calcium: 11.5 mg/dL — ABNORMAL HIGH (ref 8.9–10.3)
Chloride: 110 mmol/L (ref 98–111)
Creatinine: 0.9 mg/dL (ref 0.44–1.00)
GFR, Estimated: >60 mL/min (ref >60)
Glucose, Bld: 115 mg/dL — ABNORMAL HIGH (ref 70–99)
Potassium: 3.9 mmol/L (ref 3.5–5.1)
Sodium: 142 mmol/L (ref 135–145)
Total Bilirubin: 0.3 mg/dL (ref 0.3–1.2)
Total Protein: 7.1 g/dL (ref 6.5–8.1)

## 2022-09-27 NOTE — Research (Signed)
[employer] 2021-05 - Specimen Collection Study to Evaluate Biomarkers in Subjects with Cancer   Introduced study at [facility]. Patient is not interested in enrolling.  Marjie Skiff Yudit Modesitt, RN, BSN, [facility] She  Her  Hers Clinical Research Nurse [facility] Direct Dial [phone]  Pager [phone] 09/27/2022 1:49 PM

## 2022-09-27 NOTE — Progress Notes (Signed)
[place] Work  Initial Assessment   Angie Garcia is a 72 y.o. year old female accompanied by spouse. Clinical Social Work was referred by  [facility]  for assessment of psychosocial needs.   SDOH (Social Determinants of Health) assessments performed: Yes SDOH Interventions    Flowsheet Row Clinical Support from 09/27/2022 in [place] at [place] from 04/05/2022 in [place] at [place] from 04/01/2020 in [place] at [place] Interventions Intervention Not Indicated Intervention Not Indicated Intervention Not Indicated  Housing Interventions Intervention Not Indicated Intervention Not Indicated Intervention Not Indicated  Transportation Interventions Intervention Not Indicated Intervention Not Indicated Intervention Not Indicated  Utilities Interventions Intervention Not Indicated Intervention Not Indicated --  Alcohol Usage Interventions -- Intervention Not Indicated (Score <7) --  Financial Strain Interventions Intervention Not Indicated Intervention Not Indicated Intervention Not Indicated  Physical Activity Interventions -- Intervention Not Indicated Intervention Not Indicated  Stress Interventions -- Intervention Not Indicated Intervention Not Indicated  Social Connections Interventions -- Intervention Not Indicated Intervention Not Indicated       SDOH Screenings   Food Insecurity: No Food Insecurity (09/27/2022)  Housing: Low Risk  (09/27/2022)  Transportation Needs: No Transportation Needs (09/27/2022)  Utilities: Not At Risk (09/27/2022)  Alcohol Screen: Low Risk  (04/05/2022)  Depression (PHQ2-9): Low Risk  (04/05/2022)  Financial Resource Strain: Low Risk  (09/27/2022)  Physical Activity: Sufficiently Active (04/05/2022)  Social Connections: Socially Integrated (04/05/2022)  Stress: No Stress Concern Present (04/05/2022)   Tobacco Use: Medium Risk (09/27/2022)     Distress Screen completed: Yes    09/27/2022    2:17 PM  ONCBCN DISTRESS SCREENING  Screening Type Initial Screening  Distress experienced in past week (1-10) 7  Emotional problem type Nervousness/Anxiety;Adjusting to illness  Information Concerns Type Lack of info about diagnosis;Lack of info about treatment;Lack of info about complementary therapy choices      Family/Social Information:  Housing Arrangement: patient lives with husband , [place] Family members/support persons in your life? Family- especially husband and now DIL. Transportation concerns: no  Employment: Retired. She and her husband watch their granddaughter during the day (80 months old).  Income source: retirement income Financial concerns: No Type of concern: None Food access concerns: no Services Currently in place:  [employer]  Coping/ Adjustment to diagnosis: Patient understands treatment plan and what happens next? yes,  feels better after speaking with medical team and learning more about treatment plan Concerns about diagnosis and/or treatment:  fear of the unknown , anxiety with waiting for results Patient reported stressors: Anxiety/ nervousness and Adjusting to my illness Hopes and/or priorities: be able to heal and continue to care for her grandchild Patient enjoys time with family/ friends Current coping skills/ strengths: Ability for insight , Communication skills , Motivation for treatment/growth , and Supportive family/friends     SUMMARY: Current SDOH Barriers:  No major barriers at this time  Clinical Social Work Clinical Goal(s):  No clinical social work goals at this time  Interventions: Discussed common feeling and emotions when being diagnosed with cancer, and the importance of support during treatment Informed patient of the support team roles and support services at [facility] Provided CSW contact information and encouraged patient to call with any  questions or concerns   Follow Up Plan: Patient will contact CSW with any support or resource needs Patient verbalizes understanding of plan:  Yes    Charmine Bockrath E Gracemarie Skeet, LCSW

## 2022-09-27 NOTE — Research (Signed)
S2205, ICE COMPRESS: RANDOMIZED TRIAL OF LIMB CRYOCOMPRESSION VERSUS CONTINUOUS COMPRESSION VERSUS LOW CYCLIC COMPRESSION FOR THE PREVENTION  OF TAXANE-INDUCED PERIPHERAL NEUROPATHY  Introduced study at [facility]. Patient is not interested in enrolling.  Marjie Skiff Aleesa Sweigert, RN, BSN, [facility] She  Her  [place] Clinical Research Nurse [facility] Direct Dial [phone]  Pager [phone] 09/27/2022 1:50 PM

## 2022-09-27 NOTE — Telephone Encounter (Signed)
Ms. Angie Garcia was seen by a genetic counselor during the breast multidisciplinary clinic on 09/27/2022. In addition to her personal history of breast cancer, she reported a family history of breast cancer in her mother diagnosed at age 70 and an unknown cancer diagnosed in her maternal uncle. She does not meet NCCN criteria for genetic testing at this time. She was still offered genetic counseling and testing but declined. We encourage her to contact us if there are any changes to her personal or family history of cancer. If she meets NCCN criteria based on the updated personal/family history, she would be recommended to have genetic counseling and testing.   Lucille Passy, MS, [facility] Genetic Counselor [email or web address] (P) [phone]

## 2022-09-27 NOTE — Assessment & Plan Note (Signed)
This is a very pleasant 72 year old postmenopausal female patient with newly diagnosed left breast invasive ductal carcinoma, grade 2, ER/PR positive HER2 with lymph node involvement referred to breast [place] for additional recommendations.  Given small tumor, strong ER/PR positivity despite lymph node planned surgery followed by Oncotype Dx testing.  We have discussed the following findings about Oncotype Dx.  We have discussed about Oncotype Dx score which is a well validated prognostic scoring system which can predict outcome with endocrine therapy alone and whether chemotherapy reduces recurrence.  Typically in patients with ER positive cancers that are node negative if the RS score is high typically greater than or equal to 26, chemotherapy is recommended.  In women with intermediate recurrence score younger than 39, there can still be some role for chemotherapy in addition to endocrine therapy especially if the recurrence score is between 21-25. If chemotherapy is needed, this will precede radiation and then after radiation she will continue on antiestrogen therapy.   She at this time is not interested in chemotherapy however she is willing to proceed with Oncotype DX testing.  She understands that if Oncotype does suggest no additional benefit from chemotherapy, she will proceed with radiation followed by antiestrogen therapy.  We have focused our discussion on antiestrogen therapy and options.    We have discussed options for antiestrogen therapy today  With regards to Tamoxifen, we discussed that this is a SERM, selective estrogen receptor modulator. We discussed mechanism of action of Tamoxifen, adverse effects on Tamoxifen including but not limited to post menopausal symptoms, increased risk of DVT/PE, increased risk of endometrial cancer, questionable cataracts with long term use and increased risk of cardiovascular events in the study which was not statistically significant. A benefit from  Tamoxifen would be improvement in bone density. With regards to aromatase inhibitors, we discussed mechanism of action, adverse effects including but not limited to post menopausal symptoms, arthralgias, myalgias, increased risk of cardiovascular events and bone loss.   She is leaning towards aromatase inhibitors for antiestrogen therapy.  She will return to clinic after surgery to review Oncotype DX results and to discuss any additional recommendations.  All her questions were answered to the best my knowledge.  If she were to indeed require chemo, we have discussed about TC every 21 days for 4 cycles.  Thank you for consulting [place] in the care of this patient.  Please do not hesitate to contact us with any additional questions or concerns.

## 2022-09-27 NOTE — Progress Notes (Signed)
[place] NOTE  Patient Care Team: Binnie Rail, MD as PCP - General (Internal Medicine) Renato Shin, MD (Inactive) as Consulting Physician (Endocrinology) de Zenia Resides, [place], [place] as Consulting Physician (Optometry) Lenord Fellers, Cle[place], [facility] as Pharmacist (Pharmacist) Benay Pike, MD as Consulting Physician (Hematology and Oncology) Rolm Bookbinder, MD as Consulting Physician (General Surgery) Gery Pray, MD as Consulting Physician (Radiation Oncology) Mauro Kaufmann, RN as Oncology Nurse Navigator Rockwell Germany, RN as Oncology Nurse Navigator  CHIEF COMPLAINTS/PURPOSE OF CONSULTATION:  Newly diagnosed breast cancer  HISTORY OF PRESENTING ILLNESS:  Angie Garcia 72 y.o. female is here because of recent diagnosis of left breast cancer  I reviewed her records extensively and collaborated the history with the patient.  SUMMARY OF ONCOLOGIC HISTORY: Oncology History  Malignant neoplasm of upper-inner quadrant of left breast in female, estrogen receptor positive  08/29/2022 Mammogram   Bilateral screening mammogram showed possible mass in the left breast warranting further evaluation.  No suspicious findings for malignancy in the right breast.  Diagnostic mammogram confirmed a suspicious mass in the left breast at 9:00 measuring 1.8 cm, suspicious left axillary lymph node.  Ultrasound confirmed the above-mentioned findings.   09/18/2022 Pathology Results   Pathology from the left breast needle core biopsy at 9:00 2 cm from nipple showed overall grade 2 invasive ductal carcinoma, prognostic showed ER 100% positive strong staining PR 75% positive moderate to strong staining Ki-67 of 2% and HER2 negative.  On the lymph node biopsy there is a comment with states that the nuclear features are similar but there is obvious ductular differentiation which is different compared to the primary specimen hence prognostics were also repeated on the lymph node which  once again confirmed ER 100% positive strong staining PR 30% positive moderate to strong staining Ki-67 of 5% and group 5 HER2 negative   09/25/2022 Initial Diagnosis   Malignant neoplasm of upper-inner quadrant of left breast in female, estrogen receptor positive    Angie Garcia is here for an initial visit with her husband. She denies any new health complaints today.  She is healthy at baseline, retired from healthcare.  She used to work in Engineer, petroleum, Chief Strategy Officer.  Her husband was in the phlebotomy department.   She did not feel this lump prior to the mammogram.  Rest of the pertinent 10 point ROS reviewed and negative  MEDICAL HISTORY:  Past Medical History:  Diagnosis Date   Allergy    Arthritis    Baker's cyst    Left knee   Breast cancer    Hx of adenomatous polyp of colon 05/09/2017   Hypertension    Lactose intolerance    Shingles 10/2008   RUE    SURGICAL HISTORY: Past Surgical History:  Procedure Laterality Date   ABDOMINAL HYSTERECTOMY     Dr Connye Burkitt for fibroids   BREAST BIOPSY Left 09/18/2022   [place] LT BREAST BX W LOC DEV 1ST LESION IMG BX [place] US GUIDE 09/18/2022 [facility] MAMMOGRAPHY   COLONOSCOPY  12/11/2005   lymphoid aggregate removed (suspected polyp was not), melanosis coli   PILONIDAL CYSTECTOMY     spine cyst    SOCIAL HISTORY: Social History   Socioeconomic History   Marital status: Married    Spouse name: [name]   Number of children: 1   Years of education: Not on file   Highest education level: Not on file  Occupational History   Occupation: retired    Fish farm manager: [employer]  Tobacco Use   Smoking status: Former    Types: Cigarettes    Quit date: 06/27/1995    Years since quitting: 27.2   Smokeless tobacco: Never   Tobacco comments:    smoked [age], up to < 1 ppd  Vaping Use   Vaping Use: Never used  Substance and Sexual Activity   Alcohol use: No   Drug use: No   Sexual activity: Yes  Other Topics Concern   Not on file  Social History Narrative    REGULAR EXERCISE   Social Determinants of Health   Financial Resource Strain: Low Risk  (04/05/2022)   Overall Financial Resource Strain (CARDIA)    Difficulty of Paying Living Expenses: Not hard at all  Food Insecurity: No Food Insecurity (04/05/2022)   Hunger Vital Sign    Worried About Running Out of Food in the Last Year: Never true    Ran Out of Food in the Last Year: Never true  Transportation Needs: No Transportation Needs (04/05/2022)   PRAPARE - Hydrologist (Medical): No    Lack of Transportation (Non-Medical): No  Physical Activity: Sufficiently Active (04/05/2022)   Exercise Vital Sign    Days of Exercise per Week: 5 days    Minutes of Exercise per Session: 30 min  Stress: No Stress Concern Present (04/05/2022)   [place]    Feeling of Stress : Not at all  Social Connections: [place] (04/05/2022)   Social Connection and Isolation Panel [NHANES]    Frequency of Communication with Friends and Family: More than three times a week    Frequency of Social Gatherings with Friends and Family: More than three times a week    Attends Religious Services: More than 4 times per year    Active Member of [employer] or Organizations: Yes    Attends Music therapist: More than 4 times per year    Marital Status: Married  Human resources officer Violence: Not At Risk (04/05/2022)   Humiliation, Afraid, Rape, and Kick questionnaire    Fear of Current or Ex-Partner: No    Emotionally Abused: No    Physically Abused: No    Sexually Abused: No    FAMILY HISTORY: Family History  Problem Relation Age of Onset   Hypertension Mother    Breast cancer Mother        32s   Diabetes Father    Stroke Father        MINI STROKES   Stroke Sister 23   Diabetes Sister    Cancer Maternal Uncle        UNKNOWN TYPE   Colon cancer Neg Hx    Esophageal cancer Neg Hx    Pancreatic  cancer Neg Hx    Rectal cancer Neg Hx    Stomach cancer Neg Hx    Hyperparathyroidism Neg Hx     ALLERGIES:  is allergic to sulfamethoxazole-trimethoprim.  MEDICATIONS:  Current Outpatient Medications  Medication Sig Dispense Refill   acetaminophen (TYLENOL) 650 MG CR tablet Take 650 mg by mouth every 8 (eight) hours as needed for pain.     cetirizine (ZYRTEC) 10 MG tablet Take 10 mg by mouth daily.     diltiazem (CARDIZEM CD) 120 MG 24 hr capsule Take 1 capsule (120 mg total) by mouth daily. 90 capsule 3   lisinopril (ZESTRIL) 20 MG tablet Take 1 tablet (20 mg total) by mouth daily. 90 tablet 3  TURMERIC PO Take by mouth.     No current facility-administered medications for this visit.    REVIEW OF SYSTEMS:   Constitutional: Denies fevers, chills or abnormal night sweats Eyes: Denies blurriness of vision, double vision or watery eyes Ears, nose, mouth, throat, and face: Denies mucositis or sore throat Respiratory: Denies cough, dyspnea or wheezes Cardiovascular: Denies palpitation, chest discomfort or lower extremity swelling Gastrointestinal:  Denies nausea, heartburn or change in bowel habits Skin: Denies abnormal skin rashes Lymphatics: Denies new lymphadenopathy or easy bruising Neurological:Denies numbness, tingling or new weaknesses Behavioral/Psych: Mood is stable, no new changes  Breast: Denies any palpable lumps or discharge All other systems were reviewed with the patient and are negative.  PHYSICAL EXAMINATION: ECOG PERFORMANCE STATUS: 0 - Asymptomatic  Vitals:   09/27/22 0853  BP: (!) 144/86  Pulse: 84  Resp: 18  Temp: 97.7 F (36.5 C)  SpO2: 100%   Filed Weights   09/27/22 0853  Weight: 168 lb 12.8 oz (76.6 kg)    GENERAL:alert, no distress and comfortable SKIN: skin color, texture, turgor are normal, no rashes or significant lesions EYES: normal, conjunctiva are pink and non-injected, sclera clear OROPHARYNX:no exudate, no erythema and lips,  buccal mucosa, and tongue normal  NECK: supple, thyroid normal size, non-tender, without nodularity LYMPH:  no palpable lymphadenopathy in the cervical, axillary  LUNGS: clear to auscultation and percussion with normal breathing effort HEART: regular rate & rhythm and no murmurs and no lower extremity edema ABDOMEN:abdomen soft, non-tender and normal bowel sounds Musculoskeletal:no cyanosis of digits and no clubbing  PSYCH: alert & oriented x 3 with fluent speech NEURO: no focal motor/sensory deficits BREAST: Palpable left breast mass under 2 cm in the lower inner quadrant.  Left axillary lymph node not easily palpable.  LABORATORY DATA:  I have reviewed the data as listed Lab Results  Component Value Date   WBC 5.7 09/27/2022   HGB 13.4 09/27/2022   HCT 41.7 09/27/2022   MCV 87.2 09/27/2022   PLT 369 09/27/2022   Lab Results  Component Value Date   NA 142 09/27/2022   K 3.9 09/27/2022   CL 110 09/27/2022   CO2 26 09/27/2022    RADIOGRAPHIC STUDIES: I have personally reviewed the radiological reports and agreed with the findings in the report.  ASSESSMENT AND PLAN:  Malignant neoplasm of upper-inner quadrant of left breast in female, estrogen receptor positive This is a very pleasant 72 year old postmenopausal female patient with newly diagnosed left breast invasive ductal carcinoma, grade 2, ER/PR positive HER2 with lymph node involvement referred to breast [place] for additional recommendations.  Given small tumor, strong ER/PR positivity despite lymph node planned surgery followed by Oncotype Dx testing.  We have discussed the following findings about Oncotype Dx.  We have discussed about Oncotype Dx score which is a well validated prognostic scoring system which can predict outcome with endocrine therapy alone and whether chemotherapy reduces recurrence.  Typically in patients with ER positive cancers that are node negative if the RS score is high typically greater than or equal  to 26, chemotherapy is recommended.  In women with intermediate recurrence score younger than 25, there can still be some role for chemotherapy in addition to endocrine therapy especially if the recurrence score is between 21-25. If chemotherapy is needed, this will precede radiation and then after radiation she will continue on antiestrogen therapy.   She at this time is not interested in chemotherapy however she is willing to proceed with Oncotype DX  testing.  She understands that if Oncotype does suggest no additional benefit from chemotherapy, she will proceed with radiation followed by antiestrogen therapy.  We have focused our discussion on antiestrogen therapy and options.    We have discussed options for antiestrogen therapy today  With regards to Tamoxifen, we discussed that this is a SERM, selective estrogen receptor modulator. We discussed mechanism of action of Tamoxifen, adverse effects on Tamoxifen including but not limited to post menopausal symptoms, increased risk of DVT/PE, increased risk of endometrial cancer, questionable cataracts with long term use and increased risk of cardiovascular events in the study which was not statistically significant. A benefit from Tamoxifen would be improvement in bone density. With regards to aromatase inhibitors, we discussed mechanism of action, adverse effects including but not limited to post menopausal symptoms, arthralgias, myalgias, increased risk of cardiovascular events and bone loss.   She is leaning towards aromatase inhibitors for antiestrogen therapy.  She will return to clinic after surgery to review Oncotype DX results and to discuss any additional recommendations.  All her questions were answered to the best my knowledge.  If she were to indeed require chemo, we have discussed about TC every 21 days for 4 cycles.  Thank you for consulting [place] in the care of this patient.  Please do not hesitate to contact us with any additional questions  or concerns.     Total time 60 minutes including history, physical exam, review of records, counseling and coordination of care All questions were answered. The patient knows to call the clinic with any problems, questions or concerns.    Benay Pike, MD 09/27/22

## 2022-09-27 NOTE — Research (Signed)
DCP-001: Use of a Clinical Trial Screening Tool to Address Cancer Health Disparities in the [place]  Introduced at [facility]; pt is not interested in research.  Marjie Skiff Garron Eline, RN, BSN, [facility] She  Her  [place] Clinical Research Nurse [facility] Direct Dial [phone]  Pager [phone] 09/27/2022 1:50 PM

## 2022-09-28 ENCOUNTER — Other Ambulatory Visit: Payer: Self-pay | Admitting: General Surgery

## 2022-09-28 DIAGNOSIS — Z170 Estrogen receptor positive status [ER+]: Secondary | ICD-10-CM

## 2022-10-03 ENCOUNTER — Ambulatory Visit: Payer: Medicare HMO | Attending: Hematology and Oncology | Admitting: Rehabilitation

## 2022-10-03 ENCOUNTER — Other Ambulatory Visit: Payer: Self-pay

## 2022-10-03 ENCOUNTER — Encounter: Payer: Self-pay | Admitting: Rehabilitation

## 2022-10-03 DIAGNOSIS — M25612 Stiffness of left shoulder, not elsewhere classified: Secondary | ICD-10-CM | POA: Diagnosis not present

## 2022-10-03 DIAGNOSIS — C50212 Malignant neoplasm of upper-inner quadrant of left female breast: Secondary | ICD-10-CM | POA: Insufficient documentation

## 2022-10-03 DIAGNOSIS — Z170 Estrogen receptor positive status [ER+]: Secondary | ICD-10-CM | POA: Insufficient documentation

## 2022-10-03 NOTE — Therapy (Signed)
OUTPATIENT PHYSICAL THERAPY BREAST CANCER BASELINE EVALUATION   Patient Name: Angie Garcia MRN: [id number] DOB:January 27, 1951, 72 y.o., female Today's Date: 10/03/2022  END OF SESSION:  PT End of Session - 10/03/22 1237     Visit Number 1    Number of Visits 2    Date for PT Re-Evaluation 11/28/22    Authorization Type none needed    PT Start Time 1200    PT Stop Time 1230    PT Time Calculation (min) 30 min    Activity Tolerance Patient tolerated treatment well    Behavior During Therapy [facility] for tasks assessed/performed             Past Medical History:  Diagnosis Date   Allergy    Arthritis    Baker's cyst    Left knee   Breast cancer    Hx of adenomatous polyp of colon 05/09/2017   Hypertension    Lactose intolerance    Shingles 10/2008   RUE   Past Surgical History:  Procedure Laterality Date   ABDOMINAL HYSTERECTOMY     Dr Roberto Scales for fibroids   BREAST BIOPSY Left 09/18/2022   [place] LT BREAST BX W LOC DEV 1ST LESION IMG BX SPEC US GUIDE 09/18/2022 [facility]   COLONOSCOPY  12/11/2005   lymphoid aggregate removed (suspected polyp was not), melanosis coli   PILONIDAL CYSTECTOMY     spine cyst   Patient Active Problem List   Diagnosis Date Noted   Malignant neoplasm of upper-inner quadrant of left breast in female, estrogen receptor positive 09/25/2022   Osteopenia 04/10/2021   Hyperparathyroidism, primary 03/14/2018   Multinodular goiter 03/14/2018   Hx of adenomatous polyp of colon 05/09/2017   Cyst of left kidney 10/21/2015   Prediabetes 08/19/2015   Hyperlipidemia 06/02/2014   DJD (degenerative joint disease) of knee 04/01/2013   Hemorrhoids, internal, with bleeding 07/04/2011   LACTOSE INTOLERANCE 11/18/2009   RHINITIS 11/18/2009   History of herpes zoster 10/28/2008   Essential hypertension 01/24/2008    PCP: Cheryll Cockayne, MD  REFERRING PROVIDER: Rachel Moulds, MD  REFERRING DIAG: Left breast cancer    THERAPY DIAG:  Malignant neoplasm  of upper-inner quadrant of left breast in female, estrogen receptor positive  Stiffness of left shoulder, not elsewhere classified  Rationale for Evaluation and Treatment: Rehabilitation  ONSET DATE: 08/29/22  SUBJECTIVE:                                                                                                                                                                                           SUBJECTIVE STATEMENT: Patient reports she is here today to  be seen by her medical team for her newly diagnosed left breast cancer.   PERTINENT HISTORY:  Patient was diagnosed on 08/29/22 with left grade 2 IDC. It measures 1.8 cm and is located in the upper inner quadrant. It is Er/PR positive with a Ki67 of 2%. One abnormal node on biopsy. She will be having a lt lumpectomy with seed and biopsy on 10/10/22. Radiation planned and oncotype testing will be performed.   PATIENT GOALS:   reduce lymphedema risk and learn post op HEP.   PAIN:  Are you having pain? No  PRECAUTIONS: Active CA   HAND DOMINANCE: right  WEIGHT BEARING RESTRICTIONS: No  FALLS:  Has patient fallen in last 6 months? No  LIVING ENVIRONMENT: Patient lives with: husband  OCCUPATION: retired from EKG tech  LEISURE: water aerobics and hiking, keeping the Haitigrandbaby (7months old)   PRIOR LEVEL OF FUNCTION: Independent   OBJECTIVE:  COGNITION: Overall cognitive status: Within functional limits for tasks assessed    POSTURE:  Forward head and rounded shoulders posture  UPPER EXTREMITY AROM/PROM:  A/PROM RIGHT   eval   Shoulder extension 70  Shoulder flexion 145  Shoulder abduction 170  Shoulder internal rotation   Shoulder external rotation 90    (Blank rows = not tested)  A/PROM LEFT   eval  Shoulder extension 50  Shoulder flexion 125  Shoulder abduction 170  Shoulder internal rotation   Shoulder external rotation 87    (Blank rows = not tested)  CERVICAL AROM: All within normal limits:    UPPER EXTREMITY STRENGTH: WNL  LYMPHEDEMA ASSESSMENTS:   LANDMARK RIGHT   eval  10 cm proximal to olecranon process 30.5  Olecranon process 26  10 cm proximal to ulnar styloid process 20.9  Just proximal to ulnar styloid process 16.4  Across hand at thumb web space 19.7  At base of 2nd digit 6.4  (Blank rows = not tested)  LANDMARK LEFT   eval  10 cm proximal to olecranon process 31.5  Olecranon process 26  10 cm proximal to ulnar styloid process 20  Just proximal to ulnar styloid process 15.8  Across hand at thumb web space 19.1  At base of 2nd digit 6.3  (Blank rows = not tested)  L-DEX LYMPHEDEMA SCREENING: The patient was assessed using the L-Dex machine today to produce a lymphedema index baseline score. The patient will be reassessed on a regular basis (typically every 3 months) to obtain new L-Dex scores. If the score is > 6.5 points away from his/her baseline score indicating onset of subclinical lymphedema, it will be recommended to wear a compression garment for 4 weeks, 12 hours per day and then be reassessed. If the score continues to be > 6.5 points from baseline at reassessment, we will initiate lymphedema treatment. Assessing in this manner has a 95% rate of preventing clinically significant lymphedema.  QUICK DASH SURVEY: 0%  PATIENT EDUCATION:  Education details: Lymphedema risk reduction and post op shoulder/posture HEP Person educated: Patient Education method: Explanation, Demonstration, Handout Education comprehension: Patient verbalized understanding and returned demonstration  HOME EXERCISE PROGRAM: Patient was instructed today in a home exercise program today for post op shoulder range of motion. These included active assist shoulder flexion in sitting, scapular retraction, wall walking with shoulder abduction, and hands behind head external rotation.  She was encouraged to do these twice a day, holding 3 seconds and repeating 5 times when permitted by  her physician.   ASSESSMENT:  CLINICAL IMPRESSION: Pt will  benefit from a post op PT reassessment to determine needs and from L-Dex screens every 3 months for 2 years to detect subclinical lymphedema. She presents with some Lt shoulder stiffness compared to the Rt side but no pain or shoulder issues.  She was educated on starting some wall walking or HEP prior to surgery.   Pt will benefit from skilled therapeutic intervention to improve on the following deficits: Decreased knowledge of precautions, impaired UE functional use, pain, decreased ROM, postural dysfunction.   PT treatment/interventions: ADL/self-care home management, pt/family education, therapeutic exercise  REHAB POTENTIAL: Excellent  CLINICAL DECISION MAKING: Stable/uncomplicated  EVALUATION COMPLEXITY: Low   GOALS: Goals reviewed with patient? YES  LONG TERM GOALS: (STG=LTG)    Name Target Date Goal status  1 Pt will be able to verbalize understanding of pertinent lymphedema risk reduction practices relevant to her dx specifically related to skin care.  Baseline:  No knowledge 10/03/2022 Achieved at eval  2 Pt will be able to return demo and/or verbalize understanding of the post op HEP related to regaining shoulder ROM. Baseline:  No knowledge 10/03/2022 Achieved at eval  3 Pt will be able to verbalize understanding of the importance of attending the post op After Breast CA Class for further lymphedema risk reduction education and therapeutic exercise.  Baseline:  No knowledge 10/03/2022 Achieved at eval  4 Pt will demo she has regained full shoulder ROM and function post operatively compared to baselines.  Baseline: See objective measurements taken today. 11/27/32     PLAN:  PT FREQUENCY/DURATION: EVAL and 1 follow up appointment.   PLAN FOR NEXT SESSION: will reassess 3-4 weeks post op to determine needs.   Patient will follow up at outpatient cancer rehab 3-4 weeks following surgery.  If the patient requires  physical therapy at that time, a specific plan will be dictated and sent to the referring physician for approval. The patient was educated today on appropriate basic range of motion exercises to begin post operatively and the importance of attending the After Breast Cancer class following surgery.  Patient was educated today on lymphedema risk reduction practices as it pertains to recommendations that will benefit the patient immediately following surgery.  She verbalized good understanding.    Physical Therapy Information for After Breast Cancer Surgery/Treatment:  Lymphedema is a swelling condition that you may be at risk for in your arm if you have lymph nodes removed from the armpit area.  After a sentinel node biopsy, the risk is approximately 5-9% and is higher after an axillary node dissection.  There is treatment available for this condition and it is not life-threatening.  Contact your physician or physical therapist with concerns. You may begin the 4 shoulder/posture exercises (see additional sheet) when permitted by your physician (typically a week after surgery).  If you have drains, you may need to wait until those are removed before beginning range of motion exercises.  A general recommendation is to not lift your arms above shoulder height until drains are removed.  These exercises should be done to your tolerance and gently.  This is not a "no pain/no gain" type of recovery so listen to your body and stretch into the range of motion that you can tolerate, stopping if you have pain.  If you are having immediate reconstruction, ask your plastic surgeon about doing exercises as he or she may want you to wait. We encourage you to attend the free one time ABC (After Breast Cancer) class offered by [facility]  Outpatient Cancer Re[facility].  You will learn information related to lymphedema risk, prevention and treatment and additional exercises to regain mobility following surgery.  You can call  [phone] for more information.  This is offered the 1st and 3rd Monday of each month.  You only attend the class one time. While undergoing any medical procedure or treatment, try to avoid blood pressure being taken or needle sticks from occurring on the arm on the side of cancer.   This recommendation begins after surgery and continues for the rest of your life.  This may help reduce your risk of getting lymphedema (swelling in your arm). An excellent resource for those seeking information on lymphedema is the National Lymphedema Network's web site. It can be accessed at www.lymphnet.org If you notice swelling in your hand, arm or breast at any time following surgery (even if it is many years from now), please contact your doctor or physical therapist to discuss this.  Lymphedema can be treated at any time but it is easier for you if it is treated early on.  If you feel like your shoulder motion is not returning to normal in a reasonable amount of time, please contact your surgeon or physical therapist.  [facility] [phone]. [address], [place] [address]  ABC CLASS After Breast Cancer Class  After Breast Cancer Class is a specially designed exercise class to assist you in a safe recover after having breast cancer surgery.  In this class you will learn how to get back to full function whether your drains were just removed or if you had surgery a month ago.  This one-time class is held the 1st and 3rd Monday of every month from 11:00 a.m. until 12:00 noon virtually.  This class is FREE and space is limited. For more information or to register for the next available class, call [phone].  Class Goals  Understand specific stretches to improve the flexibility of you chest and shoulder. Learn ways to safely strengthen your upper body and improve your posture. Understand the warning signs of infection and why you may be at risk for an arm  infection. Learn about Lymphedema and prevention.  ** You do not attend this class until after surgery.  Drains must be removed to participate  Patient was instructed today in a home exercise program today for post op shoulder range of motion. These included active assist shoulder flexion in sitting, scapular retraction, wall walking with shoulder abduction, and hands behind head external rotation.  She was encouraged to do these twice a day, holding 3 seconds and repeating 5 times when permitted by her physician.    Idamae Lusher, PT 10/03/2022, 12:38 PM

## 2022-10-05 ENCOUNTER — Encounter: Payer: Self-pay | Admitting: *Deleted

## 2022-10-05 ENCOUNTER — Telehealth: Payer: Self-pay | Admitting: *Deleted

## 2022-10-05 NOTE — Pre-Procedure Instructions (Signed)
Surgical Instructions    Your procedure is scheduled on Tuesday, October 10, 2022 at 12:30 AM.  Report to [facility] Main Entrance "A" at 10:30 A.M., then check in with the Admitting office.  Call this number if you have problems the morning of surgery:  [phone]   If you have any questions prior to your surgery date call [phone]: Open Monday-Friday 8am-4pm  *If you experience any cold or flu symptoms such as cough, fever, chills, shortness of breath, etc. between now and your scheduled surgery, please notify us.*    Remember:  Do not eat after midnight the night before your surgery  You may drink clear liquids until 9:30 AM the morning of your surgery.   Clear liquids allowed are: Water, Non-Citrus Juices (without pulp), Carbonated Beverages, Clear Tea, Black Coffee Only (NO MILK, CREAM OR POWDERED CREAMER of any kind), and Gatorade.  Patient Instructions  The night before surgery:  No food after midnight. ONLY clear liquids after midnight  The day of surgery (if you do NOT have diabetes):  Drink ONE (1) Pre-Surgery Clear Ensure by 9:30 AM the morning of surgery. Drink in one sitting. Do not sip.  This drink was given to you during your hospital  pre-op appointment visit.  Nothing else to drink after completing the  Pre-Surgery Clear Ensure.         If you have questions, please contact your surgeon's office.     Take these medicines the morning of surgery with A SIP OF WATER:  diltiazem (CARDIZEM CD)  cetirizine (ZYRTEC) - if needed  As of today, STOP taking any Aspirin (unless otherwise instructed by your surgeon) Aleve, Naproxen, Ibuprofen, Motrin, Advil, Goody's, BC's, all herbal medications, fish oil, and all vitamins.                     Do NOT Smoke (Tobacco/Vaping) for 24 hours prior to your procedure.  If you use a CPAP at night, you may bring your mask/headgear for your overnight stay.   Contacts, glasses, piercing's, hearing aid's, dentures or  partials may not be worn into surgery, please bring cases for these belongings.    For patients admitted to the hospital, discharge time will be determined by your treatment team.   Patients discharged the day of surgery will not be allowed to drive home, and someone needs to stay with them for 24 hours.  SURGICAL WAITING ROOM VISITATION Patients having surgery or a procedure may have 2 support people in the waiting area. Visitors may stay in the waiting area during the procedure and switch out with other visitors if needed. Only 1 support person is allowed in the pre-op area with the patient AFTER the patient is prepped. This person cannot be switched out.  Children under the age of 23 must have an adult accompany them who is not the patient. If the patient needs to stay at the hospital during part of their recovery, the visitor guidelines for inpatient rooms apply.  Please refer to the [facility] website for the visitor guidelines for Inpatients (after your surgery is over and you are in a regular room).    Special instructions:   [place]- Preparing For Surgery  Before surgery, you can play an important role. Because skin is not sterile, your skin needs to be as free of germs as possible. You can reduce the number of germs on your skin by washing with CHG (chlorahexidine gluconate) Soap before surgery.  CHG is an  antiseptic cleaner which kills germs and bonds with the skin to continue killing germs even after washing.    Oral Hygiene is also important to reduce your risk of infection.  Remember - BRUSH YOUR TEETH THE MORNING OF SURGERY WITH YOUR REGULAR TOOTHPASTE  Please do not use if you have an allergy to CHG or antibacterial soaps. If your skin becomes reddened/irritated stop using the CHG.  Do not shave (including legs and underarms) for at least 48 hours prior to first CHG shower. It is OK to shave your face.  Please follow these instructions carefully.   Shower the NIGHT  BEFORE SURGERY and the MORNING OF SURGERY  If you chose to wash your hair, wash your hair first as usual with your normal shampoo.  After you shampoo, rinse your hair and body thoroughly to remove the shampoo.  Use CHG Soap as you would any other liquid soap. You can apply CHG directly to the skin and wash gently with a scrungie or a clean washcloth.   Apply the CHG Soap to your body ONLY FROM THE NECK DOWN (neck, arms, chest, abdomen, legs, and back).  Do not use on open wounds or open sores. Avoid contact with your eyes, ears, mouth and genitals (private parts). Wash Face and genitals (private parts)  with your normal soap.   Wash thoroughly, paying special attention to the area where your surgery will be performed.  Thoroughly rinse your body with warm water from the neck down.  DO NOT shower/wash with your normal soap after using and rinsing off the CHG Soap.  Pat yourself dry with a CLEAN TOWEL.  Wear CLEAN PAJAMAS to bed the night before surgery  Place CLEAN SHEETS on your bed the night before your surgery  DO NOT SLEEP WITH PETS.   Day of Surgery: Take a shower with CHG soap. Do not wear lotions, powders, perfumes/colognes, or deodorant. Do not wear jewelry or makeup Do not shave 48 hours prior to surgery. Do not wear nail polish, gel polish, artificial nails, or any other type of covering on natural nails (fingers and toes) If you have artificial nails or gel coating that need to be removed by a nail salon, please have this removed prior to surgery. Artificial nails or gel coating may interfere with anesthesia's ability to adequately monitor your vital signs. Wear Clean/Comfortable clothing the morning of surgery Do not bring valuables to the hospital.  [facility] is not responsible for any belongings or valuables. Remember to brush your teeth WITH YOUR REGULAR TOOTHPASTE.   Please read over the following fact sheets that you were given.  If you received a COVID test  during your pre-op visit  it is requested that you wear a mask when out in public, stay away from anyone that may not be feeling well and notify your surgeon if you develop symptoms. If you have been in contact with anyone that has tested positive in the last 10 days please notify you surgeon.

## 2022-10-05 NOTE — Telephone Encounter (Signed)
Spoke to pt concerning [facility] from 4.3.24. Denies questions or concerns regarding dx or treatment care plan. Encourage pt to call with needs. Received verbal understanding.

## 2022-10-06 ENCOUNTER — Telehealth: Payer: Self-pay | Admitting: Hematology and Oncology

## 2022-10-06 ENCOUNTER — Encounter (HOSPITAL_COMMUNITY): Payer: Self-pay

## 2022-10-06 ENCOUNTER — Other Ambulatory Visit: Payer: Self-pay

## 2022-10-06 ENCOUNTER — Encounter (HOSPITAL_COMMUNITY)
Admission: RE | Admit: 2022-10-06 | Discharge: 2022-10-06 | Disposition: A | Discharge status: Home / Self Care | Payer: Medicare HMO | Source: Ambulatory Visit | Attending: General Surgery | Admitting: General Surgery

## 2022-10-06 VITALS — BP 143/76 | HR 82 | Temp 97.9°F | Resp 18 | Ht 65.0 in | Wt 172.3 lb

## 2022-10-06 DIAGNOSIS — I10 Essential (primary) hypertension: Secondary | ICD-10-CM | POA: Diagnosis not present

## 2022-10-06 DIAGNOSIS — Z01818 Encounter for other preprocedural examination: Secondary | ICD-10-CM | POA: Insufficient documentation

## 2022-10-06 NOTE — Progress Notes (Signed)
PCP - Cheryll Cockayne Cardiologist - Denies  PPM/ICD - Denies  Chest x-ray - N/I EKG - 10/06/22 Stress Test - Denies ECHO - Denies Cardiac Cath - Denies  Sleep Study - No OSA  DM - Denies  Blood Thinner Instructions:N/A Aspirin Instructions:n/a  ERAS Protcol -Yes PRE-SURGERY Ensure given   COVID TEST- N/A  No repsiratory virus or infection in the last 2 months.   Anesthesia review:  No  Patient denies shortness of breath, fever, cough and chest pain at PAT appointment   All instructions explained to the patient, with a verbal understanding of the material. Patient agrees to go over the instructions while at home for a better understanding. The opportunity to ask questions was provided.

## 2022-10-06 NOTE — Telephone Encounter (Signed)
Spoke with patient confirming upcoming appointments  

## 2022-10-10 ENCOUNTER — Other Ambulatory Visit: Payer: Self-pay | Admitting: General Surgery

## 2022-10-10 ENCOUNTER — Ambulatory Visit (HOSPITAL_BASED_OUTPATIENT_CLINIC_OR_DEPARTMENT_OTHER): Payer: Medicare HMO | Admitting: Anesthesiology

## 2022-10-10 ENCOUNTER — Ambulatory Visit (HOSPITAL_COMMUNITY)
Admission: RE | Admit: 2022-10-10 | Discharge: 2022-10-10 | Disposition: A | Discharge status: Home / Self Care | Payer: Medicare HMO | Attending: General Surgery | Admitting: General Surgery

## 2022-10-10 ENCOUNTER — Ambulatory Visit
Admission: RE | Admit: 2022-10-10 | Discharge: 2022-10-10 | Disposition: A | Discharge status: Home / Self Care | Payer: Medicare HMO | Source: Ambulatory Visit | Attending: General Surgery | Admitting: General Surgery

## 2022-10-10 ENCOUNTER — Encounter (HOSPITAL_COMMUNITY)
Admission: RE | Disposition: A | Discharge status: Home / Self Care | Payer: Self-pay | Source: Home / Self Care | Attending: General Surgery

## 2022-10-10 ENCOUNTER — Ambulatory Visit (HOSPITAL_COMMUNITY): Payer: Medicare HMO | Admitting: Anesthesiology

## 2022-10-10 DIAGNOSIS — I10 Essential (primary) hypertension: Secondary | ICD-10-CM | POA: Diagnosis not present

## 2022-10-10 DIAGNOSIS — C50912 Malignant neoplasm of unspecified site of left female breast: Secondary | ICD-10-CM

## 2022-10-10 DIAGNOSIS — Z87891 Personal history of nicotine dependence: Secondary | ICD-10-CM | POA: Insufficient documentation

## 2022-10-10 DIAGNOSIS — Z803 Family history of malignant neoplasm of breast: Secondary | ICD-10-CM | POA: Insufficient documentation

## 2022-10-10 DIAGNOSIS — C50212 Malignant neoplasm of upper-inner quadrant of left female breast: Secondary | ICD-10-CM

## 2022-10-10 DIAGNOSIS — C773 Secondary and unspecified malignant neoplasm of axilla and upper limb lymph nodes: Secondary | ICD-10-CM | POA: Diagnosis not present

## 2022-10-10 DIAGNOSIS — E785 Hyperlipidemia, unspecified: Secondary | ICD-10-CM | POA: Insufficient documentation

## 2022-10-10 DIAGNOSIS — Z8249 Family history of ischemic heart disease and other diseases of the circulatory system: Secondary | ICD-10-CM | POA: Diagnosis not present

## 2022-10-10 DIAGNOSIS — Z170 Estrogen receptor positive status [ER+]: Secondary | ICD-10-CM | POA: Insufficient documentation

## 2022-10-10 DIAGNOSIS — C50812 Malignant neoplasm of overlapping sites of left female breast: Secondary | ICD-10-CM | POA: Diagnosis not present

## 2022-10-10 DIAGNOSIS — G8918 Other acute postprocedural pain: Secondary | ICD-10-CM | POA: Diagnosis not present

## 2022-10-10 DIAGNOSIS — R7303 Prediabetes: Secondary | ICD-10-CM | POA: Insufficient documentation

## 2022-10-10 HISTORY — PX: BREAST LUMPECTOMY WITH RADIOACTIVE SEED AND SENTINEL LYMPH NODE BIOPSY: SHX6550

## 2022-10-10 HISTORY — PX: RADIOACTIVE SEED GUIDED AXILLARY SENTINEL LYMPH NODE: SHX6735

## 2022-10-10 HISTORY — PX: BREAST BIOPSY: SHX20

## 2022-10-10 SURGERY — BREAST LUMPECTOMY WITH RADIOACTIVE SEED AND SENTINEL LYMPH NODE BIOPSY
Anesthesia: Regional | Site: Breast | Laterality: Left

## 2022-10-10 MED ORDER — FENTANYL CITRATE (PF) 100 MCG/2ML IJ SOLN: 25.0000 ug | INTRAMUSCULAR | Status: DC | PRN

## 2022-10-10 MED ORDER — GLYCOPYRROLATE 0.2 MG/ML IJ SOLN
INTRAMUSCULAR | Status: DC | PRN
  Administered 2022-10-10: .2 mg via INTRAVENOUS

## 2022-10-10 MED ORDER — ENSURE PRE-SURGERY PO LIQD: 296.0000 mL | Freq: Once | ORAL | Status: DC

## 2022-10-10 MED ORDER — OXYCODONE HCL 5 MG/5ML PO SOLN: 5.0000 mg | Freq: Once | ORAL | Status: DC | PRN

## 2022-10-10 MED ORDER — CHLORHEXIDINE GLUCONATE 0.12 % MT SOLN
15.0000 mL | Freq: Once | OROMUCOSAL | Status: AC
  Administered 2022-10-10: 15 mL via OROMUCOSAL
  Filled 2022-10-10: qty 15

## 2022-10-10 MED ORDER — ACETAMINOPHEN 500 MG PO TABS
1000.0000 mg | ORAL_TABLET | ORAL | Status: AC
  Administered 2022-10-10: 1000 mg via ORAL
  Filled 2022-10-10: qty 2

## 2022-10-10 MED ORDER — GLYCOPYRROLATE PF 0.2 MG/ML IJ SOSY
PREFILLED_SYRINGE | INTRAMUSCULAR | Status: AC
  Filled 2022-10-10: qty 1

## 2022-10-10 MED ORDER — PROPOFOL 10 MG/ML IV BOLUS
INTRAVENOUS | Status: AC
  Filled 2022-10-10: qty 20

## 2022-10-10 MED ORDER — PHENYLEPHRINE 80 MCG/ML (10ML) SYRINGE FOR IV PUSH (FOR BLOOD PRESSURE SUPPORT)
PREFILLED_SYRINGE | INTRAVENOUS | Status: DC | PRN
  Administered 2022-10-10 (×5): 80 ug via INTRAVENOUS

## 2022-10-10 MED ORDER — FENTANYL CITRATE (PF) 250 MCG/5ML IJ SOLN
INTRAMUSCULAR | Status: AC
  Filled 2022-10-10: qty 5

## 2022-10-10 MED ORDER — DEXAMETHASONE SODIUM PHOSPHATE 10 MG/ML IJ SOLN
INTRAMUSCULAR | Status: DC | PRN
  Administered 2022-10-10: 10 mg via INTRAVENOUS

## 2022-10-10 MED ORDER — PHENYLEPHRINE 80 MCG/ML (10ML) SYRINGE FOR IV PUSH (FOR BLOOD PRESSURE SUPPORT)
PREFILLED_SYRINGE | INTRAVENOUS | Status: AC
  Filled 2022-10-10: qty 10

## 2022-10-10 MED ORDER — PROPOFOL 10 MG/ML IV BOLUS
INTRAVENOUS | Status: DC | PRN
  Administered 2022-10-10: 50 mg via INTRAVENOUS
  Administered 2022-10-10: 150 mg via INTRAVENOUS
  Administered 2022-10-10: 100 mg via INTRAVENOUS

## 2022-10-10 MED ORDER — OXYCODONE HCL 5 MG PO TABS: 5.0000 mg | ORAL_TABLET | Freq: Once | ORAL | Status: DC | PRN

## 2022-10-10 MED ORDER — EPHEDRINE SULFATE-NACL 50-0.9 MG/10ML-% IV SOSY
PREFILLED_SYRINGE | INTRAVENOUS | Status: DC | PRN
  Administered 2022-10-10 (×5): 5 mg via INTRAVENOUS

## 2022-10-10 MED ORDER — AMISULPRIDE (ANTIEMETIC) 5 MG/2ML IV SOLN: 10.0000 mg | Freq: Once | INTRAVENOUS | Status: DC | PRN

## 2022-10-10 MED ORDER — MIDAZOLAM HCL 2 MG/2ML IJ SOLN: 2.0000 mg | Freq: Once | INTRAMUSCULAR | Status: DC

## 2022-10-10 MED ORDER — MIDAZOLAM HCL 2 MG/2ML IJ SOLN
INTRAMUSCULAR | Status: AC
  Filled 2022-10-10: qty 2

## 2022-10-10 MED ORDER — TRAMADOL HCL 50 MG PO TABS: 50.0000 mg | ORAL_TABLET | Freq: Four times a day (QID) | ORAL | 0 refills | Status: DC | PRN

## 2022-10-10 MED ORDER — BUPIVACAINE HCL (PF) 0.25 % IJ SOLN
INTRAMUSCULAR | Status: DC | PRN
  Administered 2022-10-10: 30 mL via PERINEURAL

## 2022-10-10 MED ORDER — LIDOCAINE HCL (CARDIAC) PF 100 MG/5ML IV SOSY
PREFILLED_SYRINGE | INTRAVENOUS | Status: DC | PRN
  Administered 2022-10-10: 100 mg via INTRAVENOUS

## 2022-10-10 MED ORDER — CEFAZOLIN SODIUM-DEXTROSE 2-4 GM/100ML-% IV SOLN
2.0000 g | INTRAVENOUS | Status: AC
  Administered 2022-10-10: 2 g via INTRAVENOUS
  Filled 2022-10-10: qty 100

## 2022-10-10 MED ORDER — CHLORHEXIDINE GLUCONATE CLOTH 2 % EX PADS: 6.0000 | MEDICATED_PAD | Freq: Once | CUTANEOUS | Status: DC

## 2022-10-10 MED ORDER — LACTATED RINGERS IV SOLN: INTRAVENOUS | Status: DC

## 2022-10-10 MED ORDER — SUCCINYLCHOLINE CHLORIDE 200 MG/10ML IV SOSY
PREFILLED_SYRINGE | INTRAVENOUS | Status: DC | PRN
  Administered 2022-10-10: 100 mg via INTRAVENOUS

## 2022-10-10 MED ORDER — FENTANYL CITRATE (PF) 250 MCG/5ML IJ SOLN
INTRAMUSCULAR | Status: DC | PRN
  Administered 2022-10-10: 100 ug via INTRAVENOUS

## 2022-10-10 MED ORDER — EPHEDRINE 5 MG/ML INJ
INTRAVENOUS | Status: AC
  Filled 2022-10-10: qty 5

## 2022-10-10 MED ORDER — ORAL CARE MOUTH RINSE: 15.0000 mL | Freq: Once | OROMUCOSAL | Status: AC

## 2022-10-10 MED ORDER — ONDANSETRON HCL 4 MG/2ML IJ SOLN
INTRAMUSCULAR | Status: DC | PRN
  Administered 2022-10-10: 4 mg via INTRAVENOUS

## 2022-10-10 MED ORDER — BUPIVACAINE HCL (PF) 0.25 % IJ SOLN
INTRAMUSCULAR | Status: DC | PRN
  Administered 2022-10-10: 5 mL

## 2022-10-10 SURGICAL SUPPLY — 50 items
ADH SKN CLS APL DERMABOND .7 (GAUZE/BANDAGES/DRESSINGS) ×2
ADH SKN CLS LQ APL DERMABOND (GAUZE/BANDAGES/DRESSINGS) ×2
APL PRP STRL LF DISP 70% ISPRP (MISCELLANEOUS) ×2
APPLIER CLIP 9.375 MED OPEN (MISCELLANEOUS)
APR CLP MED 9.3 20 MLT OPN (MISCELLANEOUS)
BAG COUNTER SPONGE SURGICOUNT (BAG) ×2 IMPLANT
BAG SPNG CNTER NS LX DISP (BAG) ×2
BINDER BREAST LRG (GAUZE/BANDAGES/DRESSINGS) IMPLANT
BINDER BREAST XLRG (GAUZE/BANDAGES/DRESSINGS) IMPLANT
CANISTER SUCT 3000ML PPV (MISCELLANEOUS) ×2 IMPLANT
CHLORAPREP W/TINT 26 (MISCELLANEOUS) ×2 IMPLANT
CLIP APPLIE 9.375 MED OPEN (MISCELLANEOUS) IMPLANT
CLIP TI MEDIUM 6 (CLIP) ×2 IMPLANT
COVER PROBE W GEL 5X96 (DRAPES) ×4 IMPLANT
COVER SURGICAL LIGHT HANDLE (MISCELLANEOUS) ×2 IMPLANT
DERMABOND ADVANCED .7 DNX12 (GAUZE/BANDAGES/DRESSINGS) ×2 IMPLANT
DERMABOND ADVANCED .7 DNX6 (GAUZE/BANDAGES/DRESSINGS) IMPLANT
DEVICE DUBIN SPECIMEN MAMMOGRA (MISCELLANEOUS) ×2 IMPLANT
DRAPE CHEST BREAST 15X10 FENES (DRAPES) ×2 IMPLANT
ELECT COATED BLADE 2.86 ST (ELECTRODE) ×2 IMPLANT
ELECT REM PT RETURN 9FT ADLT (ELECTROSURGICAL) ×2
ELECTRODE REM PT RTRN 9FT ADLT (ELECTROSURGICAL) ×2 IMPLANT
GLOVE BIO SURGEON STRL SZ7 (GLOVE) ×2 IMPLANT
GLOVE BIOGEL PI IND STRL 7.5 (GLOVE) ×2 IMPLANT
GOWN STRL REUS W/ TWL LRG LVL3 (GOWN DISPOSABLE) ×4 IMPLANT
GOWN STRL REUS W/TWL LRG LVL3 (GOWN DISPOSABLE) ×4
HEMOSTAT ARISTA ABSORB 3G PWDR (HEMOSTASIS) IMPLANT
ILLUMINATOR WAVEGUIDE N/F (MISCELLANEOUS) IMPLANT
KIT BASIN OR (CUSTOM PROCEDURE TRAY) ×2 IMPLANT
KIT MARKER MARGIN INK (KITS) ×2 IMPLANT
NDL 18GX1X1/2 (RX/OR ONLY) (NEEDLE) IMPLANT
NDL FILTER BLUNT 18X1 1/2 (NEEDLE) IMPLANT
NDL HYPO 25GX1X1/2 BEV (NEEDLE) ×2 IMPLANT
NEEDLE 18GX1X1/2 (RX/OR ONLY) (NEEDLE) IMPLANT
NEEDLE FILTER BLUNT 18X1 1/2 (NEEDLE) IMPLANT
NEEDLE HYPO 25GX1X1/2 BEV (NEEDLE) ×2 IMPLANT
NS IRRIG 1000ML POUR BTL (IV SOLUTION) ×2 IMPLANT
PACK GENERAL/GYN (CUSTOM PROCEDURE TRAY) ×2 IMPLANT
RETRACTOR ONETRAX LX 90X20 (MISCELLANEOUS) IMPLANT
STRIP CLOSURE SKIN 1/2X4 (GAUZE/BANDAGES/DRESSINGS) ×2 IMPLANT
SUT MNCRL AB 4-0 PS2 18 (SUTURE) ×4 IMPLANT
SUT MON AB 5-0 PS2 18 (SUTURE) IMPLANT
SUT SILK 2 0 SH (SUTURE) IMPLANT
SUT VIC AB 2-0 SH 27 (SUTURE) ×4
SUT VIC AB 2-0 SH 27XBRD (SUTURE) ×4 IMPLANT
SUT VIC AB 3-0 SH 27 (SUTURE) ×4
SUT VIC AB 3-0 SH 27X BRD (SUTURE) ×4 IMPLANT
SYR CONTROL 10ML LL (SYRINGE) ×2 IMPLANT
TOWEL GREEN STERILE (TOWEL DISPOSABLE) ×2 IMPLANT
TOWEL GREEN STERILE FF (TOWEL DISPOSABLE) ×2 IMPLANT

## 2022-10-10 NOTE — H&P (Signed)
74 yof with fh of breast cancer in her mom who was in 49s. She has a screening detected left breast mass. She has B density breast tissue. She has a 1.8x1.2x0.7 cm mass on [place]. She has one abnormal axillary node. Biopsy of the breast mass is a grade II IDC that is 100% er pos, 75% pr pos, her 2 negative and Ki is 2%. The node is also positive. She is retired from [employer] as is her husband. She is here to discuss options today.  Review of Systems: A complete review of systems was obtained from the patient. I have reviewed this information and discussed as appropriate with the patient. See HPI as well for other ROS.  Review of Systems  All other systems reviewed and are negative.  Medical History: Past Medical History:  Diagnosis Date  History of cancer  Hyperlipidemia  Hypertension   Patient Active Problem List  Diagnosis  DJD (degenerative joint disease) of knee  History of herpes zoster  Hx of adenomatous polyp of colon  Hyperlipidemia  Malignant neoplasm of upper-inner quadrant of left breast in female, estrogen receptor positive (CMS/HHS-HCC)  Lactose intolerance  Hyperparathyroidism, primary (CMS/HHS-HCC)  Cyst of left kidney  Essential hypertension  Hemorrhoids, internal, with bleeding  Multinodular goiter  Osteopenia  Prediabetes   Past Surgical History:  Procedure Laterality Date  COLONOSCOPY 12/11/2005  .breast biopsy Left 09/18/2022   Allergies  Allergen Reactions  Sulfamethoxazole-Trimethoprim Other (See Comments)  Rash Because of a history of documented adverse serious drug reaction;Medi Alert bracelet is recommended   Current Outpatient Medications on File Prior to Visit  Medication Sig Dispense Refill  dilTIAZem (CARDIZEM CD) 120 MG XR capsule Take 120 mg by mouth once daily  fluticasone propionate (FLONASE) 50 mcg/actuation nasal spray Place into one nostril  lisinopriL (ZESTRIL) 20 MG tablet Take 20 mg by mouth once daily  naproxen sodium (ALEVE)  220 MG tablet Take by mouth  dilTIAZem (CARDIZEM CD) 120 MG XR capsule (Patient not taking: Reported on 09/27/2022)   Family History  Problem Relation Age of Onset  High blood pressure (Hypertension) Mother  Breast cancer Mother  Stroke Father  Diabetes Father  Diabetes Sister  Stroke Sister    Social History   Tobacco Use  Smoking Status Former  Types: Cigarettes  Quit date: 06/27/1995  Years since quitting: 27.2  Smokeless Tobacco Never  Marital status: Married  Tobacco Use  Smoking status: Former  Types: Cigarettes  Quit date: 06/27/1995  Years since quitting: 27.2  Smokeless tobacco: Never  Substance and Sexual Activity  Drug use: Never   Objective:  Physical Exam Vitals reviewed.  Constitutional:  Appearance: Normal appearance.  Cardiovascular:  Rate and Rhythm: Normal rate.  Pulmonary:  Effort: Pulmonary effort is normal.  Chest:  Breasts: Right: No inverted nipple, mass or nipple discharge.  Left: No inverted nipple, mass or nipple discharge.  Lymphadenopathy:  Upper Body:  Right upper body: No supraclavicular or axillary adenopathy.  Left upper body: No supraclavicular or axillary adenopathy.  Neurological:  Mental Status: She is alert.    Assessment and Plan:   Malignant neoplasm of upper-inner quadrant of left breast in female, estrogen receptor positive (CMS/HHS-HCC)  Left breast seed guided lumpectomy, left TAD  We discussed the staging and pathophysiology of breast cancer. We discussed all of the different options for treatment for breast cancer including surgery, chemotherapy, radiation therapy, Herceptin, and antiestrogen therapy. Likely will get oncotype after surgery  We discussed a sentinel lymph node biopsy  along with seed guided removal of the positive node - so called TAD. We discussed the performance of that with injection of Magtrace and small risk of discoloration. We discussed up to a 5% risk lifetime of chronic shoulder pain as well as  lymphedema associated with a sentinel lymph node biopsy. We discussed small chance of needing to return for ALND  We discussed the options for treatment of the breast cancer which included lumpectomy versus a mastectomy. We discussed the performance of the lumpectomy with radioactive seed placement. We discussed a 5-10% chance of a positive margin requiring reexcision in the operating room. We also discussed that she will likely need radiation therapy regardless of procedure with pos node. We discussed mastectomy and the postoperative care for that as well. Mastectomy can be followed by reconstruction. The decision for lumpectomy vs mastectomy has no impact on decision for chemotherapy. We discussed that there is no difference in her survival whether she undergoes lumpectomy with radiation therapy or antiestrogen therapy versus a mastectomy. There is also no real difference between her recurrence in the breast. We also discussed bilateral mastectomy as she was concerned. No medical benefit. She is concerned about dense breast tissue and mm. Her density now is b. After discussion she would like to proceed with lumpectomy.   We discussed the risks of operation including bleeding, infection, possible reoperation. She understands her further therapy will be based on what her stages at the time of her operation.

## 2022-10-10 NOTE — Anesthesia Procedure Notes (Signed)
Anesthesia Regional Block: Pectoralis block   Pre-Anesthetic Checklist: , timeout performed,  Correct Patient, Correct Site, Correct Laterality,  Correct Procedure, Correct Position, site marked,  Risks and benefits discussed,  Surgical consent,  Pre-op evaluation,  At surgeon's request and post-op pain management  Laterality: Left  Prep: chloraprep       Needles:  Injection technique: Single-shot  Needle Type: Echogenic Stimulator Needle     Needle Length: 9cm  Needle Gauge: 21     Additional Needles:   Procedures:,,,, ultrasound used (permanent image in chart),,    Narrative:  Start time: 10/10/2022 10:45 AM End time: 10/10/2022 10:48 AM Injection made incrementally with aspirations every 5 mL.  Performed by: Personally  Anesthesiologist: Linton Rump, MD  Additional Notes: Discussed risks and benefits of nerve block including, but not limited to, prolonged and/or permanent nerve injury involving sensory and/or motor function. Monitors were applied and a time-out was performed. The nerve and associated structures were visualized under ultrasound guidance. After negative aspiration, local anesthetic was slowly injected around the nerve. There was no evidence of high pressure during the procedure. There were no paresthesias. VSS remained stable and the patient tolerated the procedure well.

## 2022-10-10 NOTE — Transfer of Care (Signed)
Immediate Anesthesia Transfer of Care Note  Patient: CANESHA TESFAYE  Procedure(s) Performed: LEFT BREAST LUMPECTOMY WITH RADIOACTIVE SEED AND AXILLARY SENTINEL LYMPH NODE BIOPSY (Left: Breast) RADIOACTIVE SEED GUIDED LEFT AXILLARY SENTINEL LYMPH NODE EXCISION (Left)  Patient Location: PACU  Anesthesia Type:GA combined with regional for post-op pain  Level of Consciousness: drowsy and patient cooperative  Airway & Oxygen Therapy: Patient Spontanous Breathing and Patient connected to nasal cannula oxygen  Post-op Assessment: Report given to RN, Post -op Vital signs reviewed and stable, Patient moving all extremities X 4, and Patient able to stick tongue midline  Post vital signs: Reviewed and stable  Last Vitals:  Vitals Value Taken Time  BP 142/78 10/10/22 1232  Temp    Pulse 95 10/10/22 1234  Resp 18 10/10/22 1234  SpO2 97 % 10/10/22 1234  Vitals shown include unvalidated device data.  Last Pain:  Vitals:   10/10/22 1030  TempSrc:   PainSc: 0-No pain         Complications:  Encounter Notable Events  Notable Event Outcome Phase Comment  Difficult to intubate - unexpected  Intraprocedure Filed from anesthesia note documentation.

## 2022-10-10 NOTE — Discharge Instructions (Signed)
[facility] Office Phone Number [phone]  POST OP INSTRUCTIONS Take 400 mg of ibuprofen every 8 hours or 650 mg tylenol every 6 hours for next 72 hours then as needed. Use ice several times daily also.  A prescription for pain medication may be given to you upon discharge.  Take your pain medication as prescribed, if needed.  If narcotic pain medicine is not needed, then you may take acetaminophen (Tylenol), naprosyn (Alleve) or ibuprofen (Advil) as needed. Take your usually prescribed medications unless otherwise directed If you need a refill on your pain medication, please contact your pharmacy.  They will contact our office to request authorization.  Prescriptions will not be filled after 5pm or on week-ends. You should eat very light the first 24 hours after surgery, such as soup, crackers, pudding, etc.  Resume your normal diet the day after surgery. Most patients will experience some swelling and bruising in the breast.  Ice packs and a good support bra will help.  Wear the breast binder provided or a sports bra for 72 hours day and night.  After that wear a sports bra during the day until you return to the office. Swelling and bruising can take several days to resolve.  It is common to experience some constipation if taking pain medication after surgery.  Increasing fluid intake and taking a stool softener will usually help or prevent this problem from occurring.  A mild laxative (Milk of Magnesia or Miralax) should be taken according to package directions if there are no bowel movements after 48 hours. I used skin glue on the incision, you may shower in 24 hours.  The glue will flake off over the next 2-3 weeks.  Any sutures or staples will be removed at the office during your follow-up visit. ACTIVITIES:  You may resume regular daily activities (gradually increasing) beginning the next day.  Wearing a good support bra or sports bra minimizes pain and swelling.  You may have  sexual intercourse when it is comfortable. You may drive when you no longer are taking prescription pain medication, you can comfortably wear a seatbelt, and you can safely maneuver your car and apply brakes. RETURN TO WORK:  ______________________________________________________________________________________ You should see your doctor in the office for a follow-up appointment approximately two weeks after your surgery.  Your doctor's nurse will typically make your follow-up appointment when she calls you with your pathology report.  Expect your pathology report 3-4 business days after your surgery.  You may call to check if you do not hear from us after three days. OTHER INSTRUCTIONS: _______________________________________________________________________________________________ _____________________________________________________________________________________________________________________________________ _____________________________________________________________________________________________________________________________________ _____________________________________________________________________________________________________________________________________  WHEN TO CALL DR Mikaya Bunner: Fever over 101.0 Nausea and/or vomiting. Extreme swelling or bruising. Continued bleeding from incision. Increased pain, redness, or drainage from the incision.  The clinic staff is available to answer your questions during regular business hours.  Please don't hesitate to call and ask to speak to one of the nurses for clinical concerns.  If you have a medical emergency, go to the nearest emergency room or call 911.  A surgeon from [facility] is always on call at the hospital.  For further questions, please visit [email or web address]  

## 2022-10-10 NOTE — Anesthesia Postprocedure Evaluation (Signed)
Anesthesia Post Note  Patient: Angie Garcia  Procedure(s) Performed: LEFT BREAST LUMPECTOMY WITH RADIOACTIVE SEED AND AXILLARY SENTINEL LYMPH NODE BIOPSY (Left: Breast) RADIOACTIVE SEED GUIDED LEFT AXILLARY SENTINEL LYMPH NODE EXCISION (Left)     Patient location during evaluation: PACU Anesthesia Type: Regional and General Level of consciousness: awake Pain management: pain level controlled Vital Signs Assessment: post-procedure vital signs reviewed and stable Respiratory status: spontaneous breathing, nonlabored ventilation and respiratory function stable Cardiovascular status: blood pressure returned to baseline and stable Postop Assessment: no apparent nausea or vomiting Anesthetic complications: yes   Encounter Notable Events  Notable Event Outcome Phase Comment  Difficult to intubate - unexpected  Intraprocedure Filed from anesthesia note documentation.    Last Vitals:  Vitals:   10/10/22 1245 10/10/22 1300  BP: 129/73 124/66  Pulse: 81 72  Resp: (!) 21 12  Temp:  36.5 C  SpO2: 95% 95%    Last Pain:  Vitals:   10/10/22 1300  TempSrc:   PainSc: 0-No pain                 Linton Rump

## 2022-10-10 NOTE — Anesthesia Preprocedure Evaluation (Addendum)
Anesthesia Evaluation  Patient identified by MRN, date of birth, ID band Patient awake    Reviewed: Allergy & Precautions, NPO status , Patient's Chart, lab work & pertinent test results  History of Anesthesia Complications Negative for: history of anesthetic complications  Airway Mallampati: III  TM Distance: >3 FB Neck ROM: Full    Dental  (+) Edentulous Upper, Dental Advisory Given, Missing   Pulmonary neg shortness of breath, neg sleep apnea, neg COPD, neg recent URI, former smoker   Pulmonary exam normal breath sounds clear to auscultation       Cardiovascular hypertension (lisinopril, diltiazem), Pt. on medications (-) angina (-) Past MI, (-) Cardiac Stents and (-) CABG (-) dysrhythmias  Rhythm:Regular Rate:Normal  HLD   Neuro/Psych negative neurological ROS     GI/Hepatic negative GI ROS, Neg liver ROS,,,  Endo/Other  Hyperparathyroidism, multinodular goiter, prediabetes  Renal/GU Renal disease (cyst on left kidney)     Musculoskeletal  (+) Arthritis ,  osteopenia   Abdominal   Peds  Hematology negative hematology ROS (+)   Anesthesia Other Findings Breast cancer  Reproductive/Obstetrics                             Anesthesia Physical Anesthesia Plan  ASA: 2  Anesthesia Plan: General and Regional   Post-op Pain Management: Regional block* and Tylenol PO (pre-op)*   Induction: Intravenous  PONV Risk Score and Plan: 3 and Ondansetron, Dexamethasone and Treatment may vary due to age or medical condition  Airway Management Planned: LMA  Additional Equipment:   Intra-op Plan:   Post-operative Plan: Extubation in OR  Informed Consent: I have reviewed the patients History and Physical, chart, labs and discussed the procedure including the risks, benefits and alternatives for the proposed anesthesia with the patient or authorized representative who has indicated his/her  understanding and acceptance.     Dental advisory given  Plan Discussed with: CRNA and Anesthesiologist  Anesthesia Plan Comments: (Risks of general anesthesia discussed including, but not limited to, sore throat, hoarse voice, chipped/damaged teeth, injury to vocal cords, nausea and vomiting, allergic reactions, lung infection, heart attack, stroke, and death. All questions answered. )       Anesthesia Quick Evaluation

## 2022-10-10 NOTE — Anesthesia Procedure Notes (Addendum)
Procedure Name: Intubation Date/Time: 10/10/2022 11:26 AM  Performed by: Linton Rump, MDPre-anesthesia Checklist: Patient identified, Emergency Drugs available, Suction available and Patient being monitored Patient Re-evaluated:Patient Re-evaluated prior to induction Oxygen Delivery Method: Circle system utilized Preoxygenation: Pre-oxygenation with 100% oxygen Induction Type: IV induction Ventilation: Mask ventilation without difficulty Laryngoscope Size: Mac and 3 Grade View: Grade I Tube type: Oral Tube size: 7.0 mm Number of attempts: 1 Airway Equipment and Method: Stylet Placement Confirmation: ETT inserted through vocal cords under direct vision, positive ETCO2 and breath sounds checked- equal and bilateral Secured at: 23 cm Tube secured with: Tape Dental Injury: Teeth and Oropharynx as per pre-operative assessment  Difficulty Due To: Difficulty was unanticipated Comments: Performed by Natividad Brood SRNA

## 2022-10-10 NOTE — Anesthesia Procedure Notes (Signed)
Procedure Name: LMA Insertion Date/Time: 10/10/2022 11:12 AM  Performed by: Linton Rump, MDPre-anesthesia Checklist: Patient identified, Emergency Drugs available, Suction available and Patient being monitored Patient Re-evaluated:Patient Re-evaluated prior to induction Oxygen Delivery Method: Circle system utilized Preoxygenation: Pre-oxygenation with 100% oxygen Induction Type: IV induction Ventilation: Mask ventilation without difficulty LMA: LMA inserted LMA Size: 4.0 Number of attempts: 1 Placement Confirmation: ETT inserted through vocal cords under direct vision, positive ETCO2 and breath sounds checked- equal and bilateral Tube secured with: Tape Dental Injury: Teeth and Oropharynx as per pre-operative assessment  Difficulty Due To: Difficulty was unanticipated Comments: LMA inserted by Natividad Brood SRNA

## 2022-10-10 NOTE — Interval H&P Note (Signed)
History and Physical Interval Note:  10/10/2022 10:41 AM  Angie Garcia  has presented today for surgery, with the diagnosis of LEFT BREAST CANCER.  The various methods of treatment have been discussed with the patient and family. After consideration of risks, benefits and other options for treatment, the patient has consented to  Procedure(s): LEFT BREAST LUMPECTOMY WITH RADIOACTIVE SEED AND AXILLARY SENTINEL LYMPH NODE BIOPSY (Left) RADIOACTIVE SEED GUIDED LEFT AXILLARY SENTINEL LYMPH NODE EXCISION (Left) as a surgical intervention.  The patient's history has been reviewed, patient examined, no change in status, stable for surgery.  I have reviewed the patient's chart and labs.  Questions were answered to the patient's satisfaction.     Emelia Loron

## 2022-10-10 NOTE — Op Note (Signed)
Preoperative diagnosis:  Clinical stage I left breast cancer Postoperative diagnosis: Same as above Procedure: 1.  Injection of mag trace for sentinel lymph node identification 2.  Left breast radioactive seed guided lumpectomy 3.  Left deep axillary sentinel lymph node biopsy 4. Left axillary node seed guided excision Surgeon: Dr. Harden Mo Anesthesia: General with pec block Estimated blood loss: 25 cc Drains: None Specimens: 1.  Left breast tissue marked with paint containing seed and clip 2.  Left deep axillary sentinel lymph nodes 3. Additional left breast posterior margin marked short superior, long lateral double deep Complications: None Sponge needle count was correct completion Disposition recovery stable addition   Indications: 73 yof with fh of breast cancer in her mom who was in 70s. She has a screening detected left breast mass. She has B density breast tissue. She has a 1.8x1.2x0.7 cm mass on [place]. She has one abnormal axillary node. Biopsy of the breast mass is a grade II IDC that is 100% er pos, 75% pr pos, her 2 negative and Ki is 2%. The node is also positive. We discussed a lumpectomy and TAD.    Procedure: After informed sent was obtained she first underwent a pectoral block.  She had seeds placed placed.  I had these mammograms in the operating room.  She was then placed under general anesthesia without complication.  She was prepped and draped in standard sterile surgical fashion.  Surgical timeout was performed.   I first injected the mag trace in the subareolar position.  I injected 2 cc of mag trace in the subareolar position.  This was massaged for about 5 minutes.    I made a periareolar incision in order to hide the scar later. I then used the neoprobe again to locate the seed and removed the seed and the surrounding tissue with an attempt to get a clear margin.  Mammogram confirmed removal of the clip and the seed.  3D imaging showed that it look liked like I  might be close to the posterior margin so I removed this as well.   Clips were placed in the cavity.  Hemostasis was obtained.  I closed down the breast tissue with 2-0 Vicryl.  The skin was closed with 3-0 Vicryl and 4-0 Monocryl.   I then made an incision right below the axillary hairline.  I carried this to the axillary fascia into the axilla. I identified the seed containing node and removed this. This was also a sentinel node.  Mammogram confirmed removal of the seed and clip.   I was able to identify additional sentinel nodes.and removed these.   There was no additional activity or any abnormal nodes present.  I obtained hemostasis.  I did place some Arista in the axilla.  I closed the axillary fascia with 2-0 Vicryl.  Skin was closed with 3-0 Vicryl and 4-0 Monocryl.  Glue insertion was applied.  She tolerated all this well was extubated transferred recovery stable

## 2022-10-11 ENCOUNTER — Encounter (HOSPITAL_COMMUNITY): Payer: Self-pay | Admitting: General Surgery

## 2022-10-12 DIAGNOSIS — C50912 Malignant neoplasm of unspecified site of left female breast: Secondary | ICD-10-CM | POA: Diagnosis not present

## 2022-10-18 ENCOUNTER — Encounter: Payer: Self-pay | Admitting: *Deleted

## 2022-10-18 ENCOUNTER — Telehealth: Payer: Self-pay | Admitting: *Deleted

## 2022-10-18 LAB — SURGICAL PATHOLOGY

## 2022-10-18 NOTE — Telephone Encounter (Signed)
Ordered oncotype per Dr. Iruku. Sent requisition to pathology and exact sciences. 

## 2022-10-26 DIAGNOSIS — Z170 Estrogen receptor positive status [ER+]: Secondary | ICD-10-CM | POA: Diagnosis not present

## 2022-10-26 DIAGNOSIS — C50212 Malignant neoplasm of upper-inner quadrant of left female breast: Secondary | ICD-10-CM | POA: Diagnosis not present

## 2022-10-30 NOTE — Therapy (Signed)
OUTPATIENT PHYSICAL THERAPY BREAST CANCER POST OP FOLLOW UP   Patient Name: Angie Garcia MRN: [id number] DOB:[birth year], 72 y.o., female Today's Date: 10/31/2022  END OF SESSION:  PT End of Session - 10/31/22 1033     Visit Number 2    Number of Visits 2    Date for PT Re-Evaluation 11/28/22    PT Start Time 1000    PT Stop Time 1034    PT Time Calculation (min) 34 min    Activity Tolerance Patient tolerated treatment well    Behavior During Therapy [facility] for tasks assessed/performed             Past Medical History:  Diagnosis Date   Allergy    Arthritis    Baker's cyst    Left knee   Breast cancer (HCC)    Hx of adenomatous polyp of colon 05/09/2017   Hypertension    Lactose intolerance    Shingles 10/2008   RUE   Past Surgical History:  Procedure Laterality Date   ABDOMINAL HYSTERECTOMY     Dr Roberto Scales for fibroids   BREAST BIOPSY Left 09/18/2022   [place] LT BREAST BX W LOC DEV 1ST LESION IMG BX SPEC US GUIDE 09/18/2022 [facility]   BREAST BIOPSY  10/10/2022   MM LT RADIOACTIVE SEED LOC MAMMO GUIDE 10/10/2022 [facility]   BREAST BIOPSY Left 10/10/2022   [place] LT RADIOACTIVE SEED LOC 10/10/2022 [facility]   BREAST LUMPECTOMY WITH RADIOACTIVE SEED AND SENTINEL LYMPH NODE BIOPSY Left 10/10/2022   Procedure: LEFT BREAST LUMPECTOMY WITH RADIOACTIVE SEED AND AXILLARY SENTINEL LYMPH NODE BIOPSY;  Surgeon: Emelia Loron, MD;  Location: [facility] OR;  Service: General;  Laterality: Left;   COLONOSCOPY  12/11/2005   lymphoid aggregate removed (suspected polyp was not), melanosis coli   PILONIDAL CYSTECTOMY     spine cyst   RADIOACTIVE SEED GUIDED AXILLARY SENTINEL LYMPH NODE Left 10/10/2022   Procedure: RADIOACTIVE SEED GUIDED LEFT AXILLARY SENTINEL LYMPH NODE EXCISION;  Surgeon: Emelia Loron, MD;  Location: [facility] OR;  Service: General;  Laterality: Left;   Patient Active Problem List   Diagnosis Date Noted   Malignant neoplasm of upper-inner quadrant of left  breast in female, estrogen receptor positive (HCC) 09/25/2022   Osteopenia 04/10/2021   Hyperparathyroidism, primary (HCC) 03/14/2018   Multinodular goiter 03/14/2018   Hx of adenomatous polyp of colon 05/09/2017   Cyst of left kidney 10/21/2015   Prediabetes 08/19/2015   Hyperlipidemia 06/02/2014   DJD (degenerative joint disease) of knee 04/01/2013   Hemorrhoids, internal, with bleeding 07/04/2011   LACTOSE INTOLERANCE 11/18/2009   RHINITIS 11/18/2009   History of herpes zoster 10/28/2008   Essential hypertension 01/24/2008    PCP: Cheryll Cockayne, MD   REFERRING PROVIDER: Rachel Moulds, MD   REFERRING DIAG: Left breast cancer  THERAPY DIAG:  Malignant neoplasm of upper-inner quadrant of left breast in female, estrogen receptor positive (HCC)  Stiffness of left shoulder, not elsewhere classified  Rationale for Evaluation and Treatment: Rehabilitation  ONSET DATE: 08/29/22  SUBJECTIVE:  SUBJECTIVE STATEMENT: Breast does not feel swollen or heavy.  Not wearing compression.  Incisions are closed.    PERTINENT HISTORY:  Lt lumpectomy due to IDC ER/PR positive breast cancer on 10/10/22. 1/6 positive nodes removed. Awaiting oncotype  PATIENT GOALS:  Reassess how my recovery is going related to arm function, pain, and swelling.  PAIN:  Are you having pain? Intermittent pain only   PRECAUTIONS: Recent Surgery, left UE Lymphedema risk  ACTIVITY LEVEL / LEISURE: I am back but not lifting as much.    OBJECTIVE:   PATIENT SURVEYS:  QUICK DASH: 4.55%  OBSERVATIONS: Healing well   POSTURE:  WNL  LYMPHEDEMA ASSESSMENT:   UPPER EXTREMITY AROM/PROM:   A/PROM RIGHT   eval    Shoulder extension 70  Shoulder flexion 145  Shoulder abduction 170  Shoulder internal rotation    Shoulder  external rotation 90                          (Blank rows = not tested)   A/PROM LEFT   eval 10/31/22  Shoulder extension 50 50  Shoulder flexion 125 150  Shoulder abduction 170 164  Shoulder internal rotation     Shoulder external rotation 87 90                          (Blank rows = not tested)   CERVICAL AROM: All within normal limits:    UPPER EXTREMITY STRENGTH: WNL   LYMPHEDEMA ASSESSMENTS:    LANDMARK RIGHT   eval  10 cm proximal to olecranon process 30.5  Olecranon process 26  10 cm proximal to ulnar styloid process 20.9  Just proximal to ulnar styloid process 16.4  Across hand at thumb web space 19.7  At base of 2nd digit 6.4  (Blank rows = not tested)   LANDMARK LEFT   eval 10/31/22  10 cm proximal to olecranon process 31.5 31.5  Olecranon process 26 25.5  10 cm proximal to ulnar styloid process 20 20.3  Just proximal to ulnar styloid process 15.8 15.8  Across hand at thumb web space 19.1   At base of 2nd digit 6.3   (Blank rows = not tested)   PATIENT EDUCATION:  Education details: post op care per below Person educated: Patient Education method: Chief Technology Officer Education comprehension: verbalized understanding  HOME EXERCISE PROGRAM: Reviewed previously given post op HEP. Taught pt supine versions  ASSESSMENT:  CLINICAL IMPRESSION: Pt is doing very well post operatively.  She has almost regained full ROM.  Educated pt on risk reduction for lymphedema as she is unable to do the ABC class due to work.    Pt will benefit from skilled therapeutic intervention to improve on the following deficits: Decreased knowledge of precautions, impaired UE functional use, pain, decreased ROM, postural dysfunction.   PT treatment/interventions: ADL/Self care home management, Therapeutic exercises, Patient/Family education, and Re-evaluation   GOALS: Goals reviewed with patient? Yes  LONG TERM GOALS:  (STG=LTG)  GOALS Name Target Date  Goal status  1  Pt will demonstrate she has regained full shoulder ROM and function post operatively compared to baselines.  Baseline: 10/31/22 MOSTLY MET  2 Pt will be educated on lymphedema risk reduction instead of ABC class due to work schedule 10/31/22 met               PLAN:  PT FREQUENCY/DURATION: SOZO only  PLAN FOR NEXT SESSION: SOZO  only   [employer]  [address]  [place] [address]  [phone]  After Breast Cancer Class It is recommended you attend the ABC class to be educated on lymphedema risk reduction. This class is free of charge and lasts for 1 hour. It is a 1-time class. You will need to download the TEAMS app either on your phone or computer. We will send you a link the night before or the morning of the class. You should be able to click on that link to join the class. This is not a confidential class. You don't have to turn your camera on, but other participants may be able to see your email address.  Scar massage You can begin gentle scar massage to you incision sites. Gently place one hand on the incision and move the skin (without sliding on the skin) in various directions. Do this for a few minutes and then you can gently massage either coconut oil or vitamin E cream into the scars.  Compression garment You should continue wearing your compression bra until you feel like you no longer have swelling.  Home exercise Program Continue doing the exercises you were given until you feel like you can do them without feeling any tightness at the end.   Walking Program Studies show that 30 minutes of walking per day (fast enough to elevate your heart rate) can significantly reduce the risk of a cancer recurrence. If you can't walk due to other medical reasons, we encourage you to find another activity you could do (like a stationary bike or water exercise).  Posture After breast cancer surgery, people frequently sit with rounded shoulders posture  because it puts their incisions on slack and feels better. If you sit like this and scar tissue forms in that position, you can become very tight and have pain sitting or standing with good posture. Try to be aware of your posture and sit and stand up tall to heal properly.  Follow up PT: It is recommended you return every 3 months for the first 3 years following surgery to be assessed on the SOZO machine for an L-Dex score. This helps prevent clinically significant lymphedema in 95% of patients. These follow up screens are 10 minute appointments that you are not billed for.  Idamae Lusher, PT 10/31/2022, 10:34 AM

## 2022-10-31 ENCOUNTER — Encounter: Payer: Self-pay | Admitting: Rehabilitation

## 2022-10-31 ENCOUNTER — Ambulatory Visit: Payer: Medicare HMO | Attending: Hematology and Oncology | Admitting: Rehabilitation

## 2022-10-31 DIAGNOSIS — C50212 Malignant neoplasm of upper-inner quadrant of left female breast: Secondary | ICD-10-CM | POA: Diagnosis not present

## 2022-10-31 DIAGNOSIS — M25612 Stiffness of left shoulder, not elsewhere classified: Secondary | ICD-10-CM | POA: Diagnosis not present

## 2022-10-31 DIAGNOSIS — Z170 Estrogen receptor positive status [ER+]: Secondary | ICD-10-CM | POA: Insufficient documentation

## 2022-11-03 ENCOUNTER — Telehealth: Payer: Self-pay | Admitting: *Deleted

## 2022-11-03 ENCOUNTER — Encounter: Payer: Self-pay | Admitting: *Deleted

## 2022-11-03 DIAGNOSIS — Z170 Estrogen receptor positive status [ER+]: Secondary | ICD-10-CM

## 2022-11-03 NOTE — Telephone Encounter (Signed)
Received oncotype results of 17/15%. Referral placed for Dr. Roselind Messier

## 2022-11-08 ENCOUNTER — Encounter: Payer: Self-pay | Admitting: *Deleted

## 2022-11-08 NOTE — Progress Notes (Signed)
Location of Breast Cancer: Malignant neoplasm of upper-inner quadrant of left breast in female, estrogen receptor positive (HCC)   Histology per Pathology Report:  10-10-22 FINAL MICROSCOPIC DIAGNOSIS:  A. BREAST, LEFT, LUMPECTOMY: Invasive ductal carcinoma, 1.9 x 1.9 x 1.5 cm, grade through/III Ductal carcinoma in situ: Solid and cribriform type, nuclear grade 2 of 3 Margins, invasive: Negative     Closest, invasive: Superior at 3 mm Margins, DCIS: Negative     Closest, DCIS: Superior at 3 mm Lymphovascular invasion: Not identified Prognostic markers:  ER positive, PR positive, Her2 negative, Ki-67 2% % Other: Complex sclerosing lesion, see comment below See oncology table  B. LYMPH NODE, LEFT AXILLARY, SENTINEL, EXCISION: -  1 of 3 lymph node positive for malignancy (metastatic deposit 12 mm in greatest dimension) with focal extracapsular extension (1/3).  C. LYMPH NODE, LEFT AXILLARY, SENTINEL, EXCISION: -  1 lymph node negative for malignancy (0/1) serially sectioned and multiple levels examined.  D. LYMPH NODE, LEFT AXILLARY, SENTINEL, EXCISION: -  1 lymph node negative for malignancy (0/1) serially sectioned and multiple levels examined.  E. LYMPH NODE, LEFT AXILLARY, SENTINEL, EXCISION: -  1 lymph node negative for malignancy (0/1) multiple levels examined.  F. BREAST, LEFT ADDITIONAL POSTERIOR MARGIN, EXCISION: -  Benign breast tissue with an incidental radial scar and intraductal papilloma in the background of fibrocystic change, columnar cell change, apocrine metaplasia and usual ductal hyperplasia. -  Immunohistochemical stains (p63, smooth muscle myosin and calponin) show retained myoepithelial cells in the complex sclerosing lesions. CK5/6 and ER immunohistochemical stains show a pattern consistent with usual ductal hyperplasia.  ONCOLOGY TABLE:  INVASIVE CARCINOMA OF THE BREAST:  Resection  Procedure: Lumpectomy with additional posterior margin excision  and sentinel lymph nodes Specimen Laterality: Left Histologic Type: Invasive ductal carcinoma (NOS)/invasive mammary carcinoma (NST) Histologic Grade:      Glandular (Acinar)/Tubular Differentiation: 3/3      Nuclear Pleomorphism: 2/3      Mitotic Rate: 2/3      Overall Grade: II/III Tumor Size: 19 x 19 x 15 mm Ductal Carcinoma In Situ: Solid and cribriform type, nuclear grade 2 of 3 Lymphatic and/or Vascular Invasion: Not identified Treatment Effect in the Breast: No known presurgical therapy Margins: All margins negative for invasive carcinoma      Distance from Closest Margin (mm): 3 mm      Specify Closest Margin (required only if <20mm): Superior DCIS Margins: Uninvolved by DCIS      Distance from Closest Margin (mm): 3      Specify Closest Margin (required only if <72mm): Superior Regional Lymph Nodes:      Number of Lymph Nodes Examined: 6      Number of Sentinel Nodes Examined: 6      Number of Lymph Nodes with Macrometastases (>2 mm): 1      Number of Lymph Nodes with Micrometastases: 0      Number of Lymph Nodes with Isolated Tumor Cells (=0.2 mm or =200 cells): 0      Size of Largest Metastatic Deposit (mm): 12      Extranodal Extension: Present Distant Metastasis:      Distant Site(s) Involved: N/A Breast Biomarker Testing Performed on Previous Biopsy:      Testing Performed on Case Number: [id number]            Estrogen Receptor: Positive, 100% strong            Progesterone Receptor: Positive, 75% moderate to strong  HER2: Negative by FISH            Ki-67: 2% Pathologic Stage Classification (pTNM, AJCC 8th Edition): pT1c, pN1a Representative Tumor Block: A2 Comment(s): Incidental complex sclerosing lesions (CSL) are also found; one with the presence of perineural invasion (a known reported finding in CSL).  The CSL is present at the orange/lateral margin (myoepithelial cells are retained; p63, calponin and smooth muscle myosin  reviewed). (v4.5.0.0)  Receptor Status: ER(100%), PR (75%), Her2-neu (neg), Ki-67(2%)  Did patient present with symptoms (if so, please note symptoms) or was this found on screening mammography?: screening mammogram  Past/Anticipated interventions by surgeon, if any: 10-10-22 Preoperative diagnosis:  Clinical stage I left breast cancer Postoperative diagnosis: Same as above Procedure: 1.  Injection of mag trace for sentinel lymph node identification 2.  Left breast radioactive seed guided lumpectomy 3.  Left deep axillary sentinel lymph node biopsy 4. Left axillary node seed guided excision Surgeon: Dr. Harden Mo  Past/Anticipated interventions by medical oncology, if any:  09-27-22 Oncology History  Malignant neoplasm of upper-inner quadrant of left breast in female, estrogen receptor positive  08/29/2022 Mammogram    Bilateral screening mammogram showed possible mass in the left breast warranting further evaluation.  No suspicious findings for malignancy in the right breast.  Diagnostic mammogram confirmed a suspicious mass in the left breast at 9:00 measuring 1.8 cm, suspicious left axillary lymph node.  Ultrasound confirmed the above-mentioned findings.    09/18/2022 Pathology Results    Pathology from the left breast needle core biopsy at 9:00 2 cm from nipple showed overall grade 2 invasive ductal carcinoma, prognostic showed ER 100% positive strong staining PR 75% positive moderate to strong staining Ki-67 of 2% and HER2 negative.  On the lymph node biopsy there is a comment with states that the nuclear features are similar but there is obvious ductular differentiation which is different compared to the primary specimen hence prognostics were also repeated on the lymph node which once again confirmed ER 100% positive strong staining PR 30% positive moderate to strong staining Ki-67 of 5% and group 5 HER2 negative    09/25/2022 Initial Diagnosis    Malignant neoplasm of upper-inner  quadrant of left breast in female, estrogen receptor positive  ASSESSMENT AND PLAN:  Malignant neoplasm of upper-inner quadrant of left breast in female, estrogen receptor positive This is a very pleasant 72 year old postmenopausal female patient with newly diagnosed left breast invasive ductal carcinoma, grade 2, ER/PR positive HER2 with lymph node involvement referred to breast [facility] for additional recommendations.  Given small tumor, strong ER/PR positivity despite lymph node planned surgery followed by Oncotype Dx testing.  We have discussed the following findings about Oncotype Dx.   We have discussed about Oncotype Dx score which is a well validated prognostic scoring system which can predict outcome with endocrine therapy alone and whether chemotherapy reduces recurrence.  Typically in patients with ER positive cancers that are node negative if the RS score is high typically greater than or equal to 26, chemotherapy is recommended.  In women with intermediate recurrence score younger than 50, there can still be some role for chemotherapy in addition to endocrine therapy especially if the recurrence score is between 21-25. If chemotherapy is needed, this will precede radiation and then after radiation she will continue on antiestrogen therapy.     She at this time is not interested in chemotherapy however she is willing to proceed with Oncotype DX testing.  She understands that if Oncotype does  suggest no additional benefit from chemotherapy, she will proceed with radiation followed by antiestrogen therapy.  We have focused our discussion on antiestrogen therapy and options.     We have discussed options for antiestrogen therapy today  With regards to Tamoxifen, we discussed that this is a SERM, selective estrogen receptor modulator. We discussed mechanism of action of Tamoxifen, adverse effects on Tamoxifen including but not limited to post menopausal symptoms, increased risk of DVT/PE, increased  risk of endometrial cancer, questionable cataracts with long term use and increased risk of cardiovascular events in the study which was not statistically significant. A benefit from Tamoxifen would be improvement in bone density. With regards to aromatase inhibitors, we discussed mechanism of action, adverse effects including but not limited to post menopausal symptoms, arthralgias, myalgias, increased risk of cardiovascular events and bone loss.    She is leaning towards aromatase inhibitors for antiestrogen therapy.  She will return to clinic after surgery to review Oncotype DX results and to discuss any additional recommendations.  All her questions were answered to the best my knowledge.  If she were to indeed require chemo, we have discussed about TC every 21 days for 4 cycles.  Thank you for consulting [place] in the care of this patient.  Please do not hesitate to contact us with any additional questions or concerns.  Lymphedema issues, if any:  no   Pain issues, if any:  yes intermittent stinging sensation.  Skin- Patient reports skin  to left breast is intact.   SAFETY ISSUES: Prior radiation? no Pacemaker/ICD? no Possible current pregnancy?no Is the patient on methotrexate? no  Current Complaints / other details:    BP 115/62 (BP Location: Right Arm, Patient Position: Sitting, Cuff Size: Large)   Pulse 79   Temp (!) 97.5 F (36.4 C)   Resp 20   Ht 5\' 7"  (1.702 m)   Wt 170 lb 9.6 oz (77.4 kg)   SpO2 100%   BMI 26.72 kg/m

## 2022-11-09 ENCOUNTER — Encounter: Payer: Self-pay | Admitting: Hematology and Oncology

## 2022-11-20 NOTE — Progress Notes (Signed)
Radiation Oncology         [phone] ________________________________  Name: Angie Garcia MRN: [id number]  Date: 11/22/2022  DOB: 1950-12-02  Re-Evaluation Note  CC: Pincus Sanes, MD  Rachel Moulds, MD  No diagnosis found.  Diagnosis:  Stage IA (cT1c, cN0, cM0) Left Breast UIQ, Invasive and in situ ductal carcinoma, ER+ / PR+ / Her2-, Grade 2: s/p lumpectomy and SLN evaluation with SLN involvement x 1   Narrative:  The patient returns today to discuss radiation treatment options. She was seen in the multidisciplinary breast clinic on 09/27/22.   She opted to proceed with a left breast lumpectomy with SLN biopsies on 10/10/22 under the care of Dr. Dwain Sarna. Pathology from the procedure revealed: tumor the size of 1.9 cm; histology of grade 2 invasive ductal carcinoma with intermediate grade DCIS; all margins negative for invasive and in situ carcinoma; nodal status of 1/6 left axillary sentinel lymph node biopsies positive for carcinoma. Prognostic indicators significant for: estrogen receptor 100% positive with strong staining intensity; progesterone receptor 75% positive with moderate-strong staining intensity; Proliferation marker Ki67 at 2%; Her2 status negative; Grade 2.   Oncotype DX was obtained on the final surgical sample and the recurrence score of 17 predicts a risk of recurrence outside the breast over the next 9 years of 15%, if the patient's only systemic therapy were to be an antiestrogen for 5 years. It also predicts no significant benefit from chemotherapy.  Based on her discussion with Dr. Al Pimple during her breast clinic evaluation, the patient is leaning towards pursuing AI following XRT. She is scheduled to meet with Dr. Al Pimple later this afternoon to review Oncotype results and discuss AI options further.   On review of systems, the patient reports ***. She denies *** and any other symptoms.    Allergies:  is allergic to  sulfamethoxazole-trimethoprim.  Meds: Current Outpatient Medications  Medication Sig Dispense Refill   cetirizine (ZYRTEC) 10 MG tablet Take 10 mg by mouth daily as needed for allergies.     diltiazem (CARDIZEM CD) 120 MG 24 hr capsule Take 1 capsule (120 mg total) by mouth daily. 90 capsule 3   lisinopril (ZESTRIL) 20 MG tablet Take 1 tablet (20 mg total) by mouth daily. 90 tablet 3   traMADol (ULTRAM) 50 MG tablet Take 1 tablet (50 mg total) by mouth every 6 (six) hours as needed. 10 tablet 0   TURMERIC PO Take 1 capsule by mouth daily.     No current facility-administered medications for this encounter.    Physical Findings: The patient is in no acute distress. Patient is alert and oriented.  vitals were not taken for this visit.  No significant changes. Lungs are clear to auscultation bilaterally. Heart has regular rate and rhythm. No palpable cervical, supraclavicular, or axillary adenopathy. Abdomen soft, non-tender, normal bowel sounds. Right Breast: no palpable mass, nipple discharge or bleeding. Left Breast: ***  Lab Findings: Lab Results  Component Value Date   WBC 5.7 09/27/2022   HGB 13.4 09/27/2022   HCT 41.7 09/27/2022   MCV 87.2 09/27/2022   PLT 369 09/27/2022    Radiographic Findings: No results found.  Impression: Stage IA (cT1c, cN0, cM0) Left Breast UIQ, Invasive and in situ ductal carcinoma, ER+ / PR+ / Her2-, Grade 2: s/p lumpectomy and SLN evaluation with SLN involvement x 1   ***  Plan:  Patient is scheduled for CT simulation {date/later today}. ***  -----------------------------------  Billie Lade, PhD, MD  This  document serves as a record of services personally performed by Antony Blackbird, MD. It was created on his behalf by Neena Rhymes, a trained medical scribe. The creation of this record is based on the scribe's personal observations and the provider's statements to them. This document has been checked and approved by the attending  provider.

## 2022-11-21 ENCOUNTER — Inpatient Hospital Stay: Payer: Medicare HMO | Attending: Hematology and Oncology | Admitting: Hematology and Oncology

## 2022-11-21 ENCOUNTER — Telehealth: Payer: Self-pay

## 2022-11-21 VITALS — BP 136/84 | HR 90 | Temp 98.1°F | Wt 168.7 lb

## 2022-11-21 DIAGNOSIS — C50212 Malignant neoplasm of upper-inner quadrant of left female breast: Secondary | ICD-10-CM | POA: Diagnosis not present

## 2022-11-21 DIAGNOSIS — Z170 Estrogen receptor positive status [ER+]: Secondary | ICD-10-CM | POA: Insufficient documentation

## 2022-11-21 NOTE — Telephone Encounter (Signed)
Rn called in attempt to obtain nurse evaluation information without success. Patient's husband answered and she was not home. Nursing staff will have to obtain information in office with appointment tomorrow.

## 2022-11-21 NOTE — Assessment & Plan Note (Addendum)
This is a very pleasant 72 year old postmenopausal female patient with newly diagnosed left breast invasive ductal carcinoma, grade 2, ER/PR positive HER2 with lymph node involvement referred to breast [facility] for additional recommendations.   She had lumpectomy which showed 1.9 x 1.9 x 1.5 cm grade 3 IDC, DCIS solid and cribriform type, nuclear grade 2 out of 3, negative margins, 1 out of 3 lymph nodes positive for malignancy with focal extracapsular extension.  Prognostic marker shows ER 100% strong PR 75% moderate to strong staining, HER2 negative by FISH, Ki-67 of 2%. Oncotype DX of 17, no definitive benefit from chemotherapy in postmenopausal women with 1-3 positive lymph nodes based on Rx ponder trial. She will now proceed with adjuvant radiation and return to clinic for anti estrogen therapy. We have discussed about anti estrogen therapy as well as abemaciclib given grade 3 IDC and LN positive. We discussed that routine [place] is not recommended for screening RTC in about 8 weeks.

## 2022-11-21 NOTE — Progress Notes (Signed)
[facility] CONSULT NOTE  Patient Care Team: Pincus Sanes, MD as PCP - General (Internal Medicine) Romero Belling, MD (Inactive) as Consulting Physician (Endocrinology) de Freida Busman, [place], OD as Consulting Physician (Optometry) Lenice Llamas, Garry Heater, [facility] as Pharmacist (Pharmacist) Rachel Moulds, MD as Consulting Physician (Hematology and Oncology) Emelia Loron, MD as Consulting Physician (General Surgery) Antony Blackbird, MD as Consulting Physician (Radiation Oncology) Pershing Proud, RN as Oncology Nurse Navigator Donnelly Angelica, RN as Oncology Nurse Navigator  CHIEF COMPLAINTS/PURPOSE OF CONSULTATION:  Newly diagnosed breast cancer  HISTORY OF PRESENTING ILLNESS:  Angie Garcia 72 y.o. female is here because of recent diagnosis of left breast cancer  I reviewed her records extensively and collaborated the history with the patient.  SUMMARY OF ONCOLOGIC HISTORY: Oncology History  Malignant neoplasm of upper-inner quadrant of left breast in female, estrogen receptor positive (HCC)  08/29/2022 Mammogram   Bilateral screening mammogram showed possible mass in the left breast warranting further evaluation.  No suspicious findings for malignancy in the right breast.  Diagnostic mammogram confirmed a suspicious mass in the left breast at 9:00 measuring 1.8 cm, suspicious left axillary lymph node.  Ultrasound confirmed the above-mentioned findings.   09/18/2022 Pathology Results   Pathology from the left breast needle core biopsy at 9:00 2 cm from nipple showed overall grade 2 invasive ductal carcinoma, prognostic showed ER 100% positive strong staining PR 75% positive moderate to strong staining Ki-67 of 2% and HER2 negative.  On the lymph node biopsy there is a comment with states that the nuclear features are similar but there is obvious ductular differentiation which is different compared to the primary specimen hence prognostics were also repeated on the lymph node  which once again confirmed ER 100% positive strong staining PR 30% positive moderate to strong staining Ki-67 of 5% and group 5 HER2 negative   09/25/2022 Initial Diagnosis   Malignant neoplasm of upper-inner quadrant of left breast in female, estrogen receptor positive    Ms. Angie Garcia is here for follow-up after her surgery.  She had left breast lumpectomy and final pathology showed 1.9 x 1.9 x 1.5 cm grade 3 IDC, DCIS solid and cribriform type nuclear grade 2 out of 3, negative margins, 1 out of 3 lymph nodes positive for malignancy with focal extracapsular extension prognostic markers showed ER +100% strong PR +75% moderate to strong staining, HER2 negative by FISH, Ki-67 of 2%   Oncotype Dx of 17.  Based on Rx ponder study, and node positive women with Oncotype DX of 26, there may not be a definitive benefit of chemotherapy.  She is healing well from surgery.  She had several questions and concerns about mammograms today.  She tells me that she is very anxious when they call her back after mammogram to come for an ultrasound and she was wondering if we can do mammograms and ultrasounds on a routine basis.  She also had a few questions about the scattered fibroglandular density.  Overall she feels thankful that the cancer is detected early and she does not need chemotherapy.  MEDICAL HISTORY:  Past Medical History:  Diagnosis Date   Allergy    Arthritis    Baker's cyst    Left knee   Breast cancer (HCC)    Hx of adenomatous polyp of colon 05/09/2017   Hypertension    Lactose intolerance    Shingles 10/2008   RUE    SURGICAL HISTORY: Past Surgical History:  Procedure Laterality Date  ABDOMINAL HYSTERECTOMY     Dr Roberto Scales for fibroids   BREAST BIOPSY Left 09/18/2022   [place] LT BREAST BX W LOC DEV 1ST LESION IMG BX SPEC US GUIDE 09/18/2022 [facility]   BREAST BIOPSY  10/10/2022   MM LT RADIOACTIVE SEED LOC MAMMO GUIDE 10/10/2022 [facility]   BREAST BIOPSY Left 10/10/2022   [place] LT  RADIOACTIVE SEED LOC 10/10/2022 [facility]   BREAST LUMPECTOMY WITH RADIOACTIVE SEED AND SENTINEL LYMPH NODE BIOPSY Left 10/10/2022   Procedure: LEFT BREAST LUMPECTOMY WITH RADIOACTIVE SEED AND AXILLARY SENTINEL LYMPH NODE BIOPSY;  Surgeon: Emelia Loron, MD;  Location: [facility] OR;  Service: General;  Laterality: Left;   COLONOSCOPY  12/11/2005   lymphoid aggregate removed (suspected polyp was not), melanosis coli   PILONIDAL CYSTECTOMY     spine cyst   RADIOACTIVE SEED GUIDED AXILLARY SENTINEL LYMPH NODE Left 10/10/2022   Procedure: RADIOACTIVE SEED GUIDED LEFT AXILLARY SENTINEL LYMPH NODE EXCISION;  Surgeon: Emelia Loron, MD;  Location: [facility] OR;  Service: General;  Laterality: Left;    SOCIAL HISTORY: Social History   Socioeconomic History   Marital status: Married    Spouse name: [name]   Number of children: 1   Years of education: Not on file   Highest education level: Not on file  Occupational History   Occupation: retired    Associate Professor: [employer]  Tobacco Use   Smoking status: Former    Types: Cigarettes    Quit date: 06/27/1995    Years since quitting: 27.4   Smokeless tobacco: Never   Tobacco comments:    smoked [age], up to < 1 ppd  Vaping Use   Vaping Use: Never used  Substance and Sexual Activity   Alcohol use: No   Drug use: No   Sexual activity: Yes  Other Topics Concern   Not on file  Social History Narrative   REGULAR EXERCISE   Social Determinants of Health   Financial Resource Strain: Low Risk  (09/27/2022)   Overall Financial Resource Strain (CARDIA)    Difficulty of Paying Living Expenses: Not hard at all  Food Insecurity: No Food Insecurity (09/27/2022)   Hunger Vital Sign    Worried About Running Out of Food in the Last Year: Never true    Ran Out of Food in the Last Year: Never true  Transportation Needs: No Transportation Needs (09/27/2022)   PRAPARE - Administrator, Civil Service (Medical): No    Lack of Transportation  (Non-Medical): No  Physical Activity: Sufficiently Active (04/05/2022)   Exercise Vital Sign    Days of Exercise per Week: 5 days    Minutes of Exercise per Session: 30 min  Stress: No Stress Concern Present (04/05/2022)   [employer] of Occupational Health - Occupational Stress Questionnaire    Feeling of Stress : Not at all  Social Connections: Socially Integrated (04/05/2022)   Social Connection and Isolation Panel [NHANES]    Frequency of Communication with Friends and Family: More than three times a week    Frequency of Social Gatherings with Friends and Family: More than three times a week    Attends Religious Services: More than 4 times per year    Active Member of [employer] or Organizations: Yes    Attends Banker Meetings: More than 4 times per year    Marital Status: Married  Catering manager Violence: Not At Risk (04/05/2022)   Humiliation, Afraid, Rape, and Kick questionnaire    Fear of Current or  Ex-Partner: No    Emotionally Abused: No    Physically Abused: No    Sexually Abused: No    FAMILY HISTORY: Family History  Problem Relation Age of Onset   Hypertension Mother    Breast cancer Mother 68   Diabetes Father    Stroke Father        MINI STROKES   Stroke Sister 88   Diabetes Sister    Cancer Maternal Uncle        UNKNOWN TYPE   Colon cancer Neg Hx    Esophageal cancer Neg Hx    Pancreatic cancer Neg Hx    Rectal cancer Neg Hx    Stomach cancer Neg Hx    Hyperparathyroidism Neg Hx     ALLERGIES:  is allergic to sulfamethoxazole-trimethoprim.  MEDICATIONS:  Current Outpatient Medications  Medication Sig Dispense Refill   cetirizine (ZYRTEC) 10 MG tablet Take 10 mg by mouth daily as needed for allergies.     diltiazem (CARDIZEM CD) 120 MG 24 hr capsule Take 1 capsule (120 mg total) by mouth daily. 90 capsule 3   lisinopril (ZESTRIL) 20 MG tablet Take 1 tablet (20 mg total) by mouth daily. 90 tablet 3   traMADol (ULTRAM) 50 MG tablet  Take 1 tablet (50 mg total) by mouth every 6 (six) hours as needed. 10 tablet 0   TURMERIC PO Take 1 capsule by mouth daily.     No current facility-administered medications for this visit.    REVIEW OF SYSTEMS:   Constitutional: Denies fevers, chills or abnormal night sweats Eyes: Denies blurriness of vision, double vision or watery eyes Ears, nose, mouth, throat, and face: Denies mucositis or sore throat Respiratory: Denies cough, dyspnea or wheezes Cardiovascular: Denies palpitation, chest discomfort or lower extremity swelling Gastrointestinal:  Denies nausea, heartburn or change in bowel habits Skin: Denies abnormal skin rashes Lymphatics: Denies new lymphadenopathy or easy bruising Neurological:Denies numbness, tingling or new weaknesses Behavioral/Psych: Mood is stable, no new changes  Breast: Denies any palpable lumps or discharge All other systems were reviewed with the patient and are negative.  PHYSICAL EXAMINATION: ECOG PERFORMANCE STATUS: 0 - Asymptomatic  Vitals:   11/21/22 1508  BP: 136/84  Pulse: 90  Temp: 98.1 F (36.7 C)  SpO2: 100%   Filed Weights   11/21/22 1508  Weight: 168 lb 11.2 oz (76.5 kg)    GENERAL:alert, no distress and comfortable   LABORATORY DATA:  I have reviewed the data as listed Lab Results  Component Value Date   WBC 5.7 09/27/2022   HGB 13.4 09/27/2022   HCT 41.7 09/27/2022   MCV 87.2 09/27/2022   PLT 369 09/27/2022   Lab Results  Component Value Date   NA 142 09/27/2022   K 3.9 09/27/2022   CL 110 09/27/2022   CO2 26 09/27/2022    RADIOGRAPHIC STUDIES: I have personally reviewed the radiological reports and agreed with the findings in the report.  ASSESSMENT AND PLAN:  Malignant neoplasm of upper-inner quadrant of left breast in female, estrogen receptor positive (HCC) This is a very pleasant 71 year old postmenopausal female patient with newly diagnosed left breast invasive ductal carcinoma, grade 2, ER/PR positive  HER2 with lymph node involvement referred to breast [facility] for additional recommendations.   She had lumpectomy which showed 1.9 x 1.9 x 1.5 cm grade 3 IDC, DCIS solid and cribriform type, nuclear grade 2 out of 3, negative margins, 1 out of 3 lymph nodes positive for malignancy with focal extracapsular extension.  Prognostic marker shows ER 100% strong PR 75% moderate to strong staining, HER2 negative by FISH, Ki-67 of 2%. Oncotype DX of 17, no definitive benefit from chemotherapy in postmenopausal women with 1-3 positive lymph nodes based on Rx ponder trial. She will now proceed with adjuvant radiation and return to clinic for anti estrogen therapy. We have discussed about anti estrogen therapy as well as abemaciclib given grade 3 IDC and LN positive. We discussed that routine [place] is not recommended for screening RTC in about 8 weeks.   Total time 40 minutes including history, physical exam, review of records, counseling and coordination of care All questions were answered. The patient knows to call the clinic with any problems, questions or concerns.    Rachel Moulds, MD 11/21/22

## 2022-11-22 ENCOUNTER — Encounter: Payer: Self-pay | Admitting: Radiation Oncology

## 2022-11-22 ENCOUNTER — Telehealth: Payer: Self-pay | Admitting: Hematology and Oncology

## 2022-11-22 ENCOUNTER — Ambulatory Visit
Admission: RE | Admit: 2022-11-22 | Discharge: 2022-11-22 | Disposition: A | Discharge status: Home / Self Care | Payer: Medicare HMO | Source: Ambulatory Visit | Attending: Radiation Oncology | Admitting: Radiation Oncology

## 2022-11-22 ENCOUNTER — Other Ambulatory Visit: Payer: Self-pay

## 2022-11-22 VITALS — BP 115/62 | HR 79 | Temp 97.5°F | Resp 20 | Ht 67.0 in | Wt 170.6 lb

## 2022-11-22 DIAGNOSIS — C50212 Malignant neoplasm of upper-inner quadrant of left female breast: Secondary | ICD-10-CM | POA: Diagnosis not present

## 2022-11-22 DIAGNOSIS — Z923 Personal history of irradiation: Secondary | ICD-10-CM | POA: Diagnosis not present

## 2022-11-22 DIAGNOSIS — Z170 Estrogen receptor positive status [ER+]: Secondary | ICD-10-CM | POA: Insufficient documentation

## 2022-11-22 DIAGNOSIS — Z510 Encounter for antineoplastic radiation therapy: Secondary | ICD-10-CM | POA: Diagnosis not present

## 2022-11-22 DIAGNOSIS — Z79899 Other long term (current) drug therapy: Secondary | ICD-10-CM | POA: Diagnosis not present

## 2022-11-22 NOTE — Telephone Encounter (Signed)
Spoke with patient confirming upcoming appointment  

## 2022-11-23 DIAGNOSIS — Z170 Estrogen receptor positive status [ER+]: Secondary | ICD-10-CM | POA: Diagnosis not present

## 2022-11-23 DIAGNOSIS — C50212 Malignant neoplasm of upper-inner quadrant of left female breast: Secondary | ICD-10-CM | POA: Diagnosis not present

## 2022-11-23 DIAGNOSIS — Z510 Encounter for antineoplastic radiation therapy: Secondary | ICD-10-CM | POA: Diagnosis not present

## 2022-11-27 ENCOUNTER — Encounter: Payer: Self-pay | Admitting: *Deleted

## 2022-11-27 DIAGNOSIS — C50212 Malignant neoplasm of upper-inner quadrant of left female breast: Secondary | ICD-10-CM

## 2022-12-05 ENCOUNTER — Other Ambulatory Visit: Payer: Self-pay

## 2022-12-05 ENCOUNTER — Ambulatory Visit
Admission: RE | Admit: 2022-12-05 | Discharge: 2022-12-05 | Disposition: A | Discharge status: Home / Self Care | Payer: Medicare HMO | Source: Ambulatory Visit | Attending: Radiation Oncology | Admitting: Radiation Oncology

## 2022-12-05 DIAGNOSIS — Z510 Encounter for antineoplastic radiation therapy: Secondary | ICD-10-CM | POA: Diagnosis not present

## 2022-12-05 DIAGNOSIS — C50212 Malignant neoplasm of upper-inner quadrant of left female breast: Secondary | ICD-10-CM | POA: Insufficient documentation

## 2022-12-05 DIAGNOSIS — Z170 Estrogen receptor positive status [ER+]: Secondary | ICD-10-CM | POA: Diagnosis not present

## 2022-12-05 LAB — RAD ONC ARIA SESSION SUMMARY

## 2022-12-06 ENCOUNTER — Ambulatory Visit
Admission: RE | Admit: 2022-12-06 | Discharge: 2022-12-06 | Disposition: A | Discharge status: Home / Self Care | Payer: Medicare HMO | Source: Ambulatory Visit | Attending: Radiation Oncology | Admitting: Radiation Oncology

## 2022-12-06 ENCOUNTER — Other Ambulatory Visit: Payer: Self-pay

## 2022-12-06 DIAGNOSIS — Z170 Estrogen receptor positive status [ER+]: Secondary | ICD-10-CM | POA: Diagnosis not present

## 2022-12-06 DIAGNOSIS — C50212 Malignant neoplasm of upper-inner quadrant of left female breast: Secondary | ICD-10-CM | POA: Diagnosis not present

## 2022-12-06 DIAGNOSIS — Z510 Encounter for antineoplastic radiation therapy: Secondary | ICD-10-CM | POA: Diagnosis not present

## 2022-12-06 LAB — RAD ONC ARIA SESSION SUMMARY

## 2022-12-07 ENCOUNTER — Ambulatory Visit
Admission: RE | Admit: 2022-12-07 | Discharge: 2022-12-07 | Disposition: A | Discharge status: Home / Self Care | Payer: Medicare HMO | Source: Ambulatory Visit | Attending: Radiation Oncology | Admitting: Radiation Oncology

## 2022-12-07 ENCOUNTER — Other Ambulatory Visit: Payer: Self-pay

## 2022-12-07 DIAGNOSIS — Z510 Encounter for antineoplastic radiation therapy: Secondary | ICD-10-CM | POA: Diagnosis not present

## 2022-12-07 DIAGNOSIS — C50212 Malignant neoplasm of upper-inner quadrant of left female breast: Secondary | ICD-10-CM | POA: Diagnosis not present

## 2022-12-07 LAB — RAD ONC ARIA SESSION SUMMARY

## 2022-12-08 ENCOUNTER — Ambulatory Visit
Admission: RE | Admit: 2022-12-08 | Discharge: 2022-12-08 | Disposition: A | Discharge status: Home / Self Care | Payer: Medicare HMO | Source: Ambulatory Visit | Attending: Radiation Oncology | Admitting: Radiation Oncology

## 2022-12-08 ENCOUNTER — Other Ambulatory Visit: Payer: Self-pay

## 2022-12-08 ENCOUNTER — Ambulatory Visit: Payer: Medicare HMO

## 2022-12-08 DIAGNOSIS — Z510 Encounter for antineoplastic radiation therapy: Secondary | ICD-10-CM | POA: Diagnosis not present

## 2022-12-08 DIAGNOSIS — Z170 Estrogen receptor positive status [ER+]: Secondary | ICD-10-CM | POA: Diagnosis not present

## 2022-12-08 DIAGNOSIS — C50212 Malignant neoplasm of upper-inner quadrant of left female breast: Secondary | ICD-10-CM | POA: Diagnosis not present

## 2022-12-08 LAB — RAD ONC ARIA SESSION SUMMARY

## 2022-12-11 ENCOUNTER — Ambulatory Visit
Admission: RE | Admit: 2022-12-11 | Discharge: 2022-12-11 | Disposition: A | Discharge status: Home / Self Care | Payer: Medicare HMO | Source: Ambulatory Visit | Attending: Radiation Oncology | Admitting: Radiation Oncology

## 2022-12-11 ENCOUNTER — Ambulatory Visit: Payer: Medicare HMO

## 2022-12-11 ENCOUNTER — Other Ambulatory Visit: Payer: Self-pay

## 2022-12-11 DIAGNOSIS — C50212 Malignant neoplasm of upper-inner quadrant of left female breast: Secondary | ICD-10-CM | POA: Diagnosis not present

## 2022-12-11 DIAGNOSIS — Z510 Encounter for antineoplastic radiation therapy: Secondary | ICD-10-CM | POA: Diagnosis not present

## 2022-12-11 DIAGNOSIS — Z170 Estrogen receptor positive status [ER+]: Secondary | ICD-10-CM | POA: Diagnosis not present

## 2022-12-11 LAB — RAD ONC ARIA SESSION SUMMARY

## 2022-12-12 ENCOUNTER — Other Ambulatory Visit: Payer: Self-pay

## 2022-12-12 ENCOUNTER — Ambulatory Visit
Admission: RE | Admit: 2022-12-12 | Discharge: 2022-12-12 | Disposition: A | Discharge status: Home / Self Care | Payer: Medicare HMO | Source: Ambulatory Visit | Attending: Radiation Oncology | Admitting: Radiation Oncology

## 2022-12-12 DIAGNOSIS — Z170 Estrogen receptor positive status [ER+]: Secondary | ICD-10-CM | POA: Diagnosis not present

## 2022-12-12 DIAGNOSIS — C50212 Malignant neoplasm of upper-inner quadrant of left female breast: Secondary | ICD-10-CM | POA: Diagnosis not present

## 2022-12-12 DIAGNOSIS — Z510 Encounter for antineoplastic radiation therapy: Secondary | ICD-10-CM | POA: Diagnosis not present

## 2022-12-12 LAB — RAD ONC ARIA SESSION SUMMARY

## 2022-12-13 ENCOUNTER — Ambulatory Visit: Payer: Medicare HMO

## 2022-12-13 ENCOUNTER — Ambulatory Visit
Admission: RE | Admit: 2022-12-13 | Discharge: 2022-12-13 | Disposition: A | Discharge status: Home / Self Care | Payer: Medicare HMO | Source: Ambulatory Visit | Attending: Radiation Oncology | Admitting: Radiation Oncology

## 2022-12-13 ENCOUNTER — Other Ambulatory Visit: Payer: Self-pay

## 2022-12-13 DIAGNOSIS — Z170 Estrogen receptor positive status [ER+]: Secondary | ICD-10-CM | POA: Diagnosis not present

## 2022-12-13 DIAGNOSIS — C50212 Malignant neoplasm of upper-inner quadrant of left female breast: Secondary | ICD-10-CM | POA: Diagnosis not present

## 2022-12-13 DIAGNOSIS — Z510 Encounter for antineoplastic radiation therapy: Secondary | ICD-10-CM | POA: Diagnosis not present

## 2022-12-13 LAB — RAD ONC ARIA SESSION SUMMARY

## 2022-12-14 ENCOUNTER — Other Ambulatory Visit: Payer: Self-pay

## 2022-12-14 ENCOUNTER — Ambulatory Visit
Admission: RE | Admit: 2022-12-14 | Discharge: 2022-12-14 | Disposition: A | Discharge status: Home / Self Care | Payer: Medicare HMO | Source: Ambulatory Visit | Attending: Radiation Oncology | Admitting: Radiation Oncology

## 2022-12-14 DIAGNOSIS — C50212 Malignant neoplasm of upper-inner quadrant of left female breast: Secondary | ICD-10-CM | POA: Diagnosis not present

## 2022-12-14 DIAGNOSIS — Z510 Encounter for antineoplastic radiation therapy: Secondary | ICD-10-CM | POA: Diagnosis not present

## 2022-12-14 DIAGNOSIS — Z170 Estrogen receptor positive status [ER+]: Secondary | ICD-10-CM | POA: Diagnosis not present

## 2022-12-14 LAB — RAD ONC ARIA SESSION SUMMARY

## 2022-12-15 ENCOUNTER — Ambulatory Visit
Admission: RE | Admit: 2022-12-15 | Discharge: 2022-12-15 | Disposition: A | Discharge status: Home / Self Care | Payer: Medicare HMO | Source: Ambulatory Visit | Attending: Radiation Oncology | Admitting: Radiation Oncology

## 2022-12-15 ENCOUNTER — Other Ambulatory Visit: Payer: Self-pay

## 2022-12-15 DIAGNOSIS — C50212 Malignant neoplasm of upper-inner quadrant of left female breast: Secondary | ICD-10-CM | POA: Diagnosis not present

## 2022-12-15 DIAGNOSIS — Z170 Estrogen receptor positive status [ER+]: Secondary | ICD-10-CM | POA: Diagnosis not present

## 2022-12-15 DIAGNOSIS — Z510 Encounter for antineoplastic radiation therapy: Secondary | ICD-10-CM | POA: Diagnosis not present

## 2022-12-15 LAB — RAD ONC ARIA SESSION SUMMARY

## 2022-12-18 ENCOUNTER — Other Ambulatory Visit: Payer: Self-pay

## 2022-12-18 ENCOUNTER — Ambulatory Visit
Admission: RE | Admit: 2022-12-18 | Discharge: 2022-12-18 | Disposition: A | Discharge status: Home / Self Care | Payer: Medicare HMO | Source: Ambulatory Visit | Attending: Radiation Oncology | Admitting: Radiation Oncology

## 2022-12-18 DIAGNOSIS — C50212 Malignant neoplasm of upper-inner quadrant of left female breast: Secondary | ICD-10-CM | POA: Diagnosis not present

## 2022-12-18 DIAGNOSIS — Z170 Estrogen receptor positive status [ER+]: Secondary | ICD-10-CM | POA: Diagnosis not present

## 2022-12-18 DIAGNOSIS — Z510 Encounter for antineoplastic radiation therapy: Secondary | ICD-10-CM | POA: Diagnosis not present

## 2022-12-18 LAB — RAD ONC ARIA SESSION SUMMARY

## 2022-12-19 ENCOUNTER — Ambulatory Visit
Admission: RE | Admit: 2022-12-19 | Discharge: 2022-12-19 | Disposition: A | Discharge status: Home / Self Care | Payer: Medicare HMO | Source: Ambulatory Visit | Attending: Radiation Oncology | Admitting: Radiation Oncology

## 2022-12-19 ENCOUNTER — Other Ambulatory Visit: Payer: Self-pay

## 2022-12-19 DIAGNOSIS — Z510 Encounter for antineoplastic radiation therapy: Secondary | ICD-10-CM | POA: Diagnosis not present

## 2022-12-19 DIAGNOSIS — Z170 Estrogen receptor positive status [ER+]: Secondary | ICD-10-CM | POA: Diagnosis not present

## 2022-12-19 DIAGNOSIS — C50212 Malignant neoplasm of upper-inner quadrant of left female breast: Secondary | ICD-10-CM | POA: Diagnosis not present

## 2022-12-19 LAB — RAD ONC ARIA SESSION SUMMARY

## 2022-12-20 ENCOUNTER — Other Ambulatory Visit: Payer: Self-pay

## 2022-12-20 ENCOUNTER — Ambulatory Visit
Admission: RE | Admit: 2022-12-20 | Discharge: 2022-12-20 | Disposition: A | Discharge status: Home / Self Care | Payer: Medicare HMO | Source: Ambulatory Visit | Attending: Radiation Oncology | Admitting: Radiation Oncology

## 2022-12-20 DIAGNOSIS — Z510 Encounter for antineoplastic radiation therapy: Secondary | ICD-10-CM | POA: Diagnosis not present

## 2022-12-20 DIAGNOSIS — C50212 Malignant neoplasm of upper-inner quadrant of left female breast: Secondary | ICD-10-CM | POA: Diagnosis not present

## 2022-12-20 DIAGNOSIS — Z170 Estrogen receptor positive status [ER+]: Secondary | ICD-10-CM | POA: Diagnosis not present

## 2022-12-20 LAB — RAD ONC ARIA SESSION SUMMARY

## 2022-12-21 ENCOUNTER — Ambulatory Visit
Admission: RE | Admit: 2022-12-21 | Discharge: 2022-12-21 | Disposition: A | Discharge status: Home / Self Care | Payer: Medicare HMO | Source: Ambulatory Visit | Attending: Radiation Oncology | Admitting: Radiation Oncology

## 2022-12-21 ENCOUNTER — Other Ambulatory Visit: Payer: Self-pay

## 2022-12-21 DIAGNOSIS — C50212 Malignant neoplasm of upper-inner quadrant of left female breast: Secondary | ICD-10-CM | POA: Diagnosis not present

## 2022-12-21 DIAGNOSIS — Z170 Estrogen receptor positive status [ER+]: Secondary | ICD-10-CM | POA: Diagnosis not present

## 2022-12-21 DIAGNOSIS — Z510 Encounter for antineoplastic radiation therapy: Secondary | ICD-10-CM | POA: Diagnosis not present

## 2022-12-21 LAB — RAD ONC ARIA SESSION SUMMARY
Course Elapsed Days: 16
Plan Fractions Treated to Date: 13
Plan Fractions Treated to Date: 13
Plan Prescribed Dose Per Fraction: 1.8 Gy
Plan Prescribed Dose Per Fraction: 1.8 Gy
Plan Total Fractions Prescribed: 28
Plan Total Fractions Prescribed: 28
Plan Total Prescribed Dose: 50.4 Gy
Plan Total Prescribed Dose: 50.4 Gy
Reference Point Dosage Given to Date: 23.4 Gy
Reference Point Dosage Given to Date: 23.4 Gy
Reference Point Session Dosage Given: 1.8 Gy
Reference Point Session Dosage Given: 1.8 Gy
Session Number: 13

## 2022-12-22 ENCOUNTER — Other Ambulatory Visit: Payer: Self-pay

## 2022-12-22 ENCOUNTER — Ambulatory Visit
Admission: RE | Admit: 2022-12-22 | Discharge: 2022-12-22 | Disposition: A | Discharge status: Home / Self Care | Payer: Medicare HMO | Source: Ambulatory Visit | Attending: Radiation Oncology | Admitting: Radiation Oncology

## 2022-12-22 DIAGNOSIS — C50212 Malignant neoplasm of upper-inner quadrant of left female breast: Secondary | ICD-10-CM | POA: Diagnosis not present

## 2022-12-22 DIAGNOSIS — Z510 Encounter for antineoplastic radiation therapy: Secondary | ICD-10-CM | POA: Diagnosis not present

## 2022-12-22 DIAGNOSIS — Z170 Estrogen receptor positive status [ER+]: Secondary | ICD-10-CM | POA: Diagnosis not present

## 2022-12-22 LAB — RAD ONC ARIA SESSION SUMMARY
Course Elapsed Days: 17
Plan Fractions Treated to Date: 14
Plan Fractions Treated to Date: 14
Plan Prescribed Dose Per Fraction: 1.8 Gy
Plan Prescribed Dose Per Fraction: 1.8 Gy
Plan Total Fractions Prescribed: 28
Plan Total Fractions Prescribed: 28
Plan Total Prescribed Dose: 50.4 Gy
Plan Total Prescribed Dose: 50.4 Gy
Reference Point Dosage Given to Date: 25.2 Gy
Reference Point Dosage Given to Date: 25.2 Gy
Reference Point Session Dosage Given: 1.8 Gy
Reference Point Session Dosage Given: 1.8 Gy
Session Number: 14

## 2022-12-25 ENCOUNTER — Other Ambulatory Visit: Payer: Self-pay

## 2022-12-25 ENCOUNTER — Ambulatory Visit
Admission: RE | Admit: 2022-12-25 | Discharge: 2022-12-25 | Disposition: A | Discharge status: Home / Self Care | Payer: Medicare HMO | Source: Ambulatory Visit | Attending: Radiation Oncology | Admitting: Radiation Oncology

## 2022-12-25 DIAGNOSIS — C50212 Malignant neoplasm of upper-inner quadrant of left female breast: Secondary | ICD-10-CM | POA: Insufficient documentation

## 2022-12-25 DIAGNOSIS — Z510 Encounter for antineoplastic radiation therapy: Secondary | ICD-10-CM | POA: Insufficient documentation

## 2022-12-25 DIAGNOSIS — Z170 Estrogen receptor positive status [ER+]: Secondary | ICD-10-CM | POA: Diagnosis not present

## 2022-12-25 LAB — RAD ONC ARIA SESSION SUMMARY
Course Elapsed Days: 20
Plan Fractions Treated to Date: 15
Plan Fractions Treated to Date: 15
Plan Prescribed Dose Per Fraction: 1.8 Gy
Plan Prescribed Dose Per Fraction: 1.8 Gy
Plan Total Fractions Prescribed: 28
Plan Total Fractions Prescribed: 28
Plan Total Prescribed Dose: 50.4 Gy
Plan Total Prescribed Dose: 50.4 Gy
Reference Point Dosage Given to Date: 27 Gy
Reference Point Dosage Given to Date: 27 Gy
Reference Point Session Dosage Given: 1.8 Gy
Reference Point Session Dosage Given: 1.8 Gy
Session Number: 15

## 2022-12-26 ENCOUNTER — Ambulatory Visit
Admission: RE | Admit: 2022-12-26 | Discharge: 2022-12-26 | Disposition: A | Discharge status: Home / Self Care | Payer: Medicare HMO | Source: Ambulatory Visit | Attending: Radiation Oncology | Admitting: Radiation Oncology

## 2022-12-26 ENCOUNTER — Other Ambulatory Visit: Payer: Self-pay

## 2022-12-26 DIAGNOSIS — Z170 Estrogen receptor positive status [ER+]: Secondary | ICD-10-CM | POA: Diagnosis not present

## 2022-12-26 DIAGNOSIS — Z510 Encounter for antineoplastic radiation therapy: Secondary | ICD-10-CM | POA: Diagnosis not present

## 2022-12-26 DIAGNOSIS — C50212 Malignant neoplasm of upper-inner quadrant of left female breast: Secondary | ICD-10-CM | POA: Diagnosis not present

## 2022-12-26 LAB — RAD ONC ARIA SESSION SUMMARY
Course Elapsed Days: 21
Plan Fractions Treated to Date: 16
Plan Fractions Treated to Date: 16
Plan Prescribed Dose Per Fraction: 1.8 Gy
Plan Prescribed Dose Per Fraction: 1.8 Gy
Plan Total Fractions Prescribed: 28
Plan Total Fractions Prescribed: 28
Plan Total Prescribed Dose: 50.4 Gy
Plan Total Prescribed Dose: 50.4 Gy
Reference Point Dosage Given to Date: 28.8 Gy
Reference Point Dosage Given to Date: 28.8 Gy
Reference Point Session Dosage Given: 1.8 Gy
Reference Point Session Dosage Given: 1.8 Gy
Session Number: 16

## 2022-12-27 ENCOUNTER — Other Ambulatory Visit: Payer: Self-pay

## 2022-12-27 ENCOUNTER — Ambulatory Visit
Admission: RE | Admit: 2022-12-27 | Discharge: 2022-12-27 | Disposition: A | Discharge status: Home / Self Care | Payer: Medicare HMO | Source: Ambulatory Visit | Attending: Radiation Oncology | Admitting: Radiation Oncology

## 2022-12-27 DIAGNOSIS — Z170 Estrogen receptor positive status [ER+]: Secondary | ICD-10-CM | POA: Diagnosis not present

## 2022-12-27 DIAGNOSIS — C50212 Malignant neoplasm of upper-inner quadrant of left female breast: Secondary | ICD-10-CM | POA: Diagnosis not present

## 2022-12-27 DIAGNOSIS — Z510 Encounter for antineoplastic radiation therapy: Secondary | ICD-10-CM | POA: Diagnosis not present

## 2022-12-27 LAB — RAD ONC ARIA SESSION SUMMARY
Course Elapsed Days: 22
Plan Fractions Treated to Date: 17
Plan Fractions Treated to Date: 17
Plan Prescribed Dose Per Fraction: 1.8 Gy
Plan Prescribed Dose Per Fraction: 1.8 Gy
Plan Total Fractions Prescribed: 28
Plan Total Fractions Prescribed: 28
Plan Total Prescribed Dose: 50.4 Gy
Plan Total Prescribed Dose: 50.4 Gy
Reference Point Dosage Given to Date: 30.6 Gy
Reference Point Dosage Given to Date: 30.6 Gy
Reference Point Session Dosage Given: 1.8 Gy
Reference Point Session Dosage Given: 1.8 Gy
Session Number: 17

## 2022-12-29 ENCOUNTER — Other Ambulatory Visit: Payer: Self-pay

## 2022-12-29 ENCOUNTER — Ambulatory Visit
Admission: RE | Admit: 2022-12-29 | Discharge: 2022-12-29 | Disposition: A | Discharge status: Home / Self Care | Payer: Medicare HMO | Source: Ambulatory Visit | Attending: Radiation Oncology | Admitting: Radiation Oncology

## 2022-12-29 DIAGNOSIS — Z510 Encounter for antineoplastic radiation therapy: Secondary | ICD-10-CM | POA: Diagnosis not present

## 2022-12-29 DIAGNOSIS — C50212 Malignant neoplasm of upper-inner quadrant of left female breast: Secondary | ICD-10-CM | POA: Diagnosis not present

## 2022-12-29 DIAGNOSIS — Z170 Estrogen receptor positive status [ER+]: Secondary | ICD-10-CM | POA: Diagnosis not present

## 2022-12-29 LAB — RAD ONC ARIA SESSION SUMMARY
Course Elapsed Days: 24
Plan Fractions Treated to Date: 18
Plan Fractions Treated to Date: 18
Plan Prescribed Dose Per Fraction: 1.8 Gy
Plan Prescribed Dose Per Fraction: 1.8 Gy
Plan Total Fractions Prescribed: 28
Plan Total Fractions Prescribed: 28
Plan Total Prescribed Dose: 50.4 Gy
Plan Total Prescribed Dose: 50.4 Gy
Reference Point Dosage Given to Date: 32.4 Gy
Reference Point Dosage Given to Date: 32.4 Gy
Reference Point Session Dosage Given: 1.8 Gy
Reference Point Session Dosage Given: 1.8 Gy
Session Number: 18

## 2023-01-01 ENCOUNTER — Other Ambulatory Visit: Payer: Self-pay

## 2023-01-01 ENCOUNTER — Ambulatory Visit
Admission: RE | Admit: 2023-01-01 | Discharge: 2023-01-01 | Disposition: A | Discharge status: Home / Self Care | Payer: Medicare HMO | Source: Ambulatory Visit | Attending: Radiation Oncology | Admitting: Radiation Oncology

## 2023-01-01 DIAGNOSIS — Z170 Estrogen receptor positive status [ER+]: Secondary | ICD-10-CM | POA: Diagnosis not present

## 2023-01-01 DIAGNOSIS — C50212 Malignant neoplasm of upper-inner quadrant of left female breast: Secondary | ICD-10-CM | POA: Diagnosis not present

## 2023-01-01 DIAGNOSIS — Z510 Encounter for antineoplastic radiation therapy: Secondary | ICD-10-CM | POA: Diagnosis not present

## 2023-01-01 LAB — RAD ONC ARIA SESSION SUMMARY
Course Elapsed Days: 27
Plan Fractions Treated to Date: 19
Plan Fractions Treated to Date: 19
Plan Prescribed Dose Per Fraction: 1.8 Gy
Plan Prescribed Dose Per Fraction: 1.8 Gy
Plan Total Fractions Prescribed: 28
Plan Total Fractions Prescribed: 28
Plan Total Prescribed Dose: 50.4 Gy
Plan Total Prescribed Dose: 50.4 Gy
Reference Point Dosage Given to Date: 34.2 Gy
Reference Point Dosage Given to Date: 34.2 Gy
Reference Point Session Dosage Given: 1.8 Gy
Reference Point Session Dosage Given: 1.8 Gy
Session Number: 19

## 2023-01-02 ENCOUNTER — Ambulatory Visit: Admission: RE | Admit: 2023-01-02 | Payer: Medicare HMO | Source: Ambulatory Visit

## 2023-01-02 ENCOUNTER — Other Ambulatory Visit: Payer: Self-pay

## 2023-01-02 DIAGNOSIS — Z510 Encounter for antineoplastic radiation therapy: Secondary | ICD-10-CM | POA: Diagnosis not present

## 2023-01-02 DIAGNOSIS — Z170 Estrogen receptor positive status [ER+]: Secondary | ICD-10-CM | POA: Diagnosis not present

## 2023-01-02 DIAGNOSIS — C50212 Malignant neoplasm of upper-inner quadrant of left female breast: Secondary | ICD-10-CM | POA: Diagnosis not present

## 2023-01-02 LAB — RAD ONC ARIA SESSION SUMMARY
Course Elapsed Days: 28
Plan Fractions Treated to Date: 20
Plan Fractions Treated to Date: 20
Plan Prescribed Dose Per Fraction: 1.8 Gy
Plan Prescribed Dose Per Fraction: 1.8 Gy
Plan Total Fractions Prescribed: 28
Plan Total Fractions Prescribed: 28
Plan Total Prescribed Dose: 50.4 Gy
Plan Total Prescribed Dose: 50.4 Gy
Reference Point Dosage Given to Date: 36 Gy
Reference Point Dosage Given to Date: 36 Gy
Reference Point Session Dosage Given: 1.8 Gy
Reference Point Session Dosage Given: 1.8 Gy
Session Number: 20

## 2023-01-02 MED ORDER — ALRA NON-METALLIC DEODORANT (RAD-ONC)
1.0000 | Freq: Once | TOPICAL | Status: AC
  Administered 2023-01-02: 1 via TOPICAL

## 2023-01-02 MED ORDER — RADIAPLEXRX EX GEL: Freq: Once | CUTANEOUS | Status: AC

## 2023-01-03 ENCOUNTER — Other Ambulatory Visit: Payer: Self-pay

## 2023-01-03 ENCOUNTER — Ambulatory Visit
Admission: RE | Admit: 2023-01-03 | Discharge: 2023-01-03 | Disposition: A | Discharge status: Home / Self Care | Payer: Medicare HMO | Source: Ambulatory Visit | Attending: Radiation Oncology | Admitting: Radiation Oncology

## 2023-01-03 DIAGNOSIS — Z510 Encounter for antineoplastic radiation therapy: Secondary | ICD-10-CM | POA: Diagnosis not present

## 2023-01-03 DIAGNOSIS — C50212 Malignant neoplasm of upper-inner quadrant of left female breast: Secondary | ICD-10-CM | POA: Diagnosis not present

## 2023-01-03 DIAGNOSIS — Z170 Estrogen receptor positive status [ER+]: Secondary | ICD-10-CM | POA: Diagnosis not present

## 2023-01-03 LAB — RAD ONC ARIA SESSION SUMMARY
Course Elapsed Days: 29
Plan Fractions Treated to Date: 21
Plan Fractions Treated to Date: 21
Plan Prescribed Dose Per Fraction: 1.8 Gy
Plan Prescribed Dose Per Fraction: 1.8 Gy
Plan Total Fractions Prescribed: 28
Plan Total Fractions Prescribed: 28
Plan Total Prescribed Dose: 50.4 Gy
Plan Total Prescribed Dose: 50.4 Gy
Reference Point Dosage Given to Date: 37.8 Gy
Reference Point Dosage Given to Date: 37.8 Gy
Reference Point Session Dosage Given: 1.8 Gy
Reference Point Session Dosage Given: 1.8 Gy
Session Number: 21

## 2023-01-04 ENCOUNTER — Ambulatory Visit
Admission: RE | Admit: 2023-01-04 | Discharge: 2023-01-04 | Disposition: A | Discharge status: Home / Self Care | Payer: Medicare HMO | Source: Ambulatory Visit | Attending: Radiation Oncology | Admitting: Radiation Oncology

## 2023-01-04 ENCOUNTER — Other Ambulatory Visit: Payer: Self-pay

## 2023-01-04 DIAGNOSIS — Z170 Estrogen receptor positive status [ER+]: Secondary | ICD-10-CM | POA: Diagnosis not present

## 2023-01-04 DIAGNOSIS — Z510 Encounter for antineoplastic radiation therapy: Secondary | ICD-10-CM | POA: Diagnosis not present

## 2023-01-04 DIAGNOSIS — C50212 Malignant neoplasm of upper-inner quadrant of left female breast: Secondary | ICD-10-CM | POA: Diagnosis not present

## 2023-01-04 LAB — RAD ONC ARIA SESSION SUMMARY
Course Elapsed Days: 30
Plan Fractions Treated to Date: 22
Plan Fractions Treated to Date: 22
Plan Prescribed Dose Per Fraction: 1.8 Gy
Plan Prescribed Dose Per Fraction: 1.8 Gy
Plan Total Fractions Prescribed: 28
Plan Total Fractions Prescribed: 28
Plan Total Prescribed Dose: 50.4 Gy
Plan Total Prescribed Dose: 50.4 Gy
Reference Point Dosage Given to Date: 39.6 Gy
Reference Point Dosage Given to Date: 39.6 Gy
Reference Point Session Dosage Given: 1.8 Gy
Reference Point Session Dosage Given: 1.8 Gy
Session Number: 22

## 2023-01-05 ENCOUNTER — Ambulatory Visit: Payer: Medicare HMO

## 2023-01-05 ENCOUNTER — Ambulatory Visit: Admission: RE | Admit: 2023-01-05 | Payer: Medicare HMO | Source: Ambulatory Visit

## 2023-01-05 ENCOUNTER — Other Ambulatory Visit: Payer: Self-pay

## 2023-01-05 DIAGNOSIS — Z170 Estrogen receptor positive status [ER+]: Secondary | ICD-10-CM | POA: Diagnosis not present

## 2023-01-05 DIAGNOSIS — C50212 Malignant neoplasm of upper-inner quadrant of left female breast: Secondary | ICD-10-CM | POA: Diagnosis not present

## 2023-01-05 DIAGNOSIS — Z510 Encounter for antineoplastic radiation therapy: Secondary | ICD-10-CM | POA: Diagnosis not present

## 2023-01-05 LAB — RAD ONC ARIA SESSION SUMMARY
Course Elapsed Days: 31
Plan Fractions Treated to Date: 23
Plan Fractions Treated to Date: 23
Plan Prescribed Dose Per Fraction: 1.8 Gy
Plan Prescribed Dose Per Fraction: 1.8 Gy
Plan Total Fractions Prescribed: 28
Plan Total Fractions Prescribed: 28
Plan Total Prescribed Dose: 50.4 Gy
Plan Total Prescribed Dose: 50.4 Gy
Reference Point Dosage Given to Date: 41.4 Gy
Reference Point Dosage Given to Date: 41.4 Gy
Reference Point Session Dosage Given: 1.8 Gy
Reference Point Session Dosage Given: 1.8 Gy
Session Number: 23

## 2023-01-08 ENCOUNTER — Ambulatory Visit
Admission: RE | Admit: 2023-01-08 | Discharge: 2023-01-08 | Disposition: A | Discharge status: Home / Self Care | Payer: Medicare HMO | Source: Ambulatory Visit | Attending: Radiation Oncology | Admitting: Radiation Oncology

## 2023-01-08 ENCOUNTER — Other Ambulatory Visit: Payer: Self-pay | Admitting: Internal Medicine

## 2023-01-08 ENCOUNTER — Other Ambulatory Visit: Payer: Self-pay

## 2023-01-08 DIAGNOSIS — Z510 Encounter for antineoplastic radiation therapy: Secondary | ICD-10-CM | POA: Diagnosis not present

## 2023-01-08 DIAGNOSIS — Z170 Estrogen receptor positive status [ER+]: Secondary | ICD-10-CM | POA: Diagnosis not present

## 2023-01-08 DIAGNOSIS — C50212 Malignant neoplasm of upper-inner quadrant of left female breast: Secondary | ICD-10-CM | POA: Diagnosis not present

## 2023-01-08 LAB — RAD ONC ARIA SESSION SUMMARY
Course Elapsed Days: 34
Plan Fractions Treated to Date: 24
Plan Fractions Treated to Date: 24
Plan Prescribed Dose Per Fraction: 1.8 Gy
Plan Prescribed Dose Per Fraction: 1.8 Gy
Plan Total Fractions Prescribed: 28
Plan Total Fractions Prescribed: 28
Plan Total Prescribed Dose: 50.4 Gy
Plan Total Prescribed Dose: 50.4 Gy
Reference Point Dosage Given to Date: 43.2 Gy
Reference Point Dosage Given to Date: 43.2 Gy
Reference Point Session Dosage Given: 1.8 Gy
Reference Point Session Dosage Given: 1.8 Gy
Session Number: 24

## 2023-01-09 ENCOUNTER — Other Ambulatory Visit: Payer: Self-pay

## 2023-01-09 ENCOUNTER — Ambulatory Visit: Payer: Medicare HMO

## 2023-01-09 ENCOUNTER — Ambulatory Visit
Admission: RE | Admit: 2023-01-09 | Discharge: 2023-01-09 | Disposition: A | Discharge status: Home / Self Care | Payer: Medicare HMO | Source: Ambulatory Visit | Attending: Radiation Oncology | Admitting: Radiation Oncology

## 2023-01-09 DIAGNOSIS — Z170 Estrogen receptor positive status [ER+]: Secondary | ICD-10-CM | POA: Diagnosis not present

## 2023-01-09 DIAGNOSIS — Z510 Encounter for antineoplastic radiation therapy: Secondary | ICD-10-CM | POA: Diagnosis not present

## 2023-01-09 DIAGNOSIS — C50212 Malignant neoplasm of upper-inner quadrant of left female breast: Secondary | ICD-10-CM | POA: Diagnosis not present

## 2023-01-09 LAB — RAD ONC ARIA SESSION SUMMARY
Course Elapsed Days: 35
Plan Fractions Treated to Date: 25
Plan Fractions Treated to Date: 25
Plan Prescribed Dose Per Fraction: 1.8 Gy
Plan Prescribed Dose Per Fraction: 1.8 Gy
Plan Total Fractions Prescribed: 28
Plan Total Fractions Prescribed: 28
Plan Total Prescribed Dose: 50.4 Gy
Plan Total Prescribed Dose: 50.4 Gy
Reference Point Dosage Given to Date: 45 Gy
Reference Point Dosage Given to Date: 45 Gy
Reference Point Session Dosage Given: 1.8 Gy
Reference Point Session Dosage Given: 1.8 Gy
Session Number: 25

## 2023-01-10 ENCOUNTER — Other Ambulatory Visit: Payer: Self-pay

## 2023-01-10 ENCOUNTER — Ambulatory Visit
Admission: RE | Admit: 2023-01-10 | Discharge: 2023-01-10 | Disposition: A | Discharge status: Home / Self Care | Payer: Medicare HMO | Source: Ambulatory Visit | Attending: Radiation Oncology | Admitting: Radiation Oncology

## 2023-01-10 DIAGNOSIS — C50212 Malignant neoplasm of upper-inner quadrant of left female breast: Secondary | ICD-10-CM | POA: Diagnosis not present

## 2023-01-10 DIAGNOSIS — Z170 Estrogen receptor positive status [ER+]: Secondary | ICD-10-CM | POA: Diagnosis not present

## 2023-01-10 DIAGNOSIS — Z510 Encounter for antineoplastic radiation therapy: Secondary | ICD-10-CM | POA: Diagnosis not present

## 2023-01-10 LAB — RAD ONC ARIA SESSION SUMMARY
Course Elapsed Days: 36
Plan Fractions Treated to Date: 26
Plan Fractions Treated to Date: 26
Plan Prescribed Dose Per Fraction: 1.8 Gy
Plan Prescribed Dose Per Fraction: 1.8 Gy
Plan Total Fractions Prescribed: 28
Plan Total Fractions Prescribed: 28
Plan Total Prescribed Dose: 50.4 Gy
Plan Total Prescribed Dose: 50.4 Gy
Reference Point Dosage Given to Date: 46.8 Gy
Reference Point Dosage Given to Date: 46.8 Gy
Reference Point Session Dosage Given: 1.8 Gy
Reference Point Session Dosage Given: 1.8 Gy
Session Number: 26

## 2023-01-11 ENCOUNTER — Other Ambulatory Visit: Payer: Self-pay

## 2023-01-11 ENCOUNTER — Ambulatory Visit: Payer: Medicare HMO

## 2023-01-11 ENCOUNTER — Ambulatory Visit
Admission: RE | Admit: 2023-01-11 | Discharge: 2023-01-11 | Disposition: A | Discharge status: Home / Self Care | Payer: Medicare HMO | Source: Ambulatory Visit | Attending: Radiation Oncology | Admitting: Radiation Oncology

## 2023-01-11 DIAGNOSIS — C50212 Malignant neoplasm of upper-inner quadrant of left female breast: Secondary | ICD-10-CM | POA: Diagnosis not present

## 2023-01-11 DIAGNOSIS — Z170 Estrogen receptor positive status [ER+]: Secondary | ICD-10-CM | POA: Diagnosis not present

## 2023-01-11 DIAGNOSIS — Z510 Encounter for antineoplastic radiation therapy: Secondary | ICD-10-CM | POA: Diagnosis not present

## 2023-01-11 LAB — RAD ONC ARIA SESSION SUMMARY
Course Elapsed Days: 37
Plan Fractions Treated to Date: 27
Plan Fractions Treated to Date: 27
Plan Prescribed Dose Per Fraction: 1.8 Gy
Plan Prescribed Dose Per Fraction: 1.8 Gy
Plan Total Fractions Prescribed: 28
Plan Total Fractions Prescribed: 28
Plan Total Prescribed Dose: 50.4 Gy
Plan Total Prescribed Dose: 50.4 Gy
Reference Point Dosage Given to Date: 48.6 Gy
Reference Point Dosage Given to Date: 48.6 Gy
Reference Point Session Dosage Given: 1.8 Gy
Reference Point Session Dosage Given: 1.8 Gy
Session Number: 27

## 2023-01-12 ENCOUNTER — Other Ambulatory Visit: Payer: Self-pay

## 2023-01-12 ENCOUNTER — Ambulatory Visit
Admission: RE | Admit: 2023-01-12 | Discharge: 2023-01-12 | Disposition: A | Discharge status: Home / Self Care | Payer: Medicare HMO | Source: Ambulatory Visit | Attending: Radiation Oncology | Admitting: Radiation Oncology

## 2023-01-12 DIAGNOSIS — C50212 Malignant neoplasm of upper-inner quadrant of left female breast: Secondary | ICD-10-CM | POA: Diagnosis not present

## 2023-01-12 DIAGNOSIS — Z170 Estrogen receptor positive status [ER+]: Secondary | ICD-10-CM | POA: Diagnosis not present

## 2023-01-12 DIAGNOSIS — Z510 Encounter for antineoplastic radiation therapy: Secondary | ICD-10-CM | POA: Diagnosis not present

## 2023-01-12 LAB — RAD ONC ARIA SESSION SUMMARY
Course Elapsed Days: 38
Plan Fractions Treated to Date: 28
Plan Fractions Treated to Date: 28
Plan Prescribed Dose Per Fraction: 1.8 Gy
Plan Prescribed Dose Per Fraction: 1.8 Gy
Plan Total Fractions Prescribed: 28
Plan Total Fractions Prescribed: 28
Plan Total Prescribed Dose: 50.4 Gy
Plan Total Prescribed Dose: 50.4 Gy
Reference Point Dosage Given to Date: 50.4 Gy
Reference Point Dosage Given to Date: 50.4 Gy
Reference Point Session Dosage Given: 1.8 Gy
Reference Point Session Dosage Given: 1.8 Gy
Session Number: 28

## 2023-01-15 ENCOUNTER — Ambulatory Visit: Payer: Medicare HMO | Admitting: Rehabilitation

## 2023-01-15 ENCOUNTER — Other Ambulatory Visit: Payer: Self-pay

## 2023-01-15 ENCOUNTER — Ambulatory Visit: Admission: RE | Admit: 2023-01-15 | Payer: Medicare HMO | Source: Ambulatory Visit

## 2023-01-15 ENCOUNTER — Ambulatory Visit
Admission: RE | Admit: 2023-01-15 | Discharge: 2023-01-15 | Disposition: A | Discharge status: Home / Self Care | Payer: Medicare HMO | Source: Ambulatory Visit | Attending: Radiation Oncology | Admitting: Radiation Oncology

## 2023-01-15 DIAGNOSIS — Z170 Estrogen receptor positive status [ER+]: Secondary | ICD-10-CM | POA: Diagnosis not present

## 2023-01-15 DIAGNOSIS — Z510 Encounter for antineoplastic radiation therapy: Secondary | ICD-10-CM | POA: Diagnosis not present

## 2023-01-15 DIAGNOSIS — C50212 Malignant neoplasm of upper-inner quadrant of left female breast: Secondary | ICD-10-CM | POA: Diagnosis not present

## 2023-01-15 LAB — RAD ONC ARIA SESSION SUMMARY
Course Elapsed Days: 41
Plan Fractions Treated to Date: 1
Plan Prescribed Dose Per Fraction: 2 Gy
Plan Total Fractions Prescribed: 6
Plan Total Prescribed Dose: 12 Gy
Reference Point Dosage Given to Date: 2 Gy
Reference Point Session Dosage Given: 2 Gy
Session Number: 29

## 2023-01-15 MED ORDER — ALRA NON-METALLIC DEODORANT (RAD-ONC)
1.0000 | Freq: Once | TOPICAL | Status: AC
  Administered 2023-01-15: 1 via TOPICAL

## 2023-01-15 MED ORDER — RADIAPLEXRX EX GEL: Freq: Once | CUTANEOUS | Status: AC

## 2023-01-16 ENCOUNTER — Ambulatory Visit
Admission: RE | Admit: 2023-01-16 | Discharge: 2023-01-16 | Disposition: A | Discharge status: Home / Self Care | Payer: Medicare HMO | Source: Ambulatory Visit | Attending: Radiation Oncology | Admitting: Radiation Oncology

## 2023-01-16 ENCOUNTER — Other Ambulatory Visit: Payer: Self-pay

## 2023-01-16 DIAGNOSIS — Z510 Encounter for antineoplastic radiation therapy: Secondary | ICD-10-CM | POA: Diagnosis not present

## 2023-01-16 DIAGNOSIS — Z170 Estrogen receptor positive status [ER+]: Secondary | ICD-10-CM | POA: Diagnosis not present

## 2023-01-16 DIAGNOSIS — C50212 Malignant neoplasm of upper-inner quadrant of left female breast: Secondary | ICD-10-CM | POA: Diagnosis not present

## 2023-01-16 LAB — RAD ONC ARIA SESSION SUMMARY
Course Elapsed Days: 42
Plan Fractions Treated to Date: 2
Plan Prescribed Dose Per Fraction: 2 Gy
Plan Total Fractions Prescribed: 6
Plan Total Prescribed Dose: 12 Gy
Reference Point Dosage Given to Date: 4 Gy
Reference Point Session Dosage Given: 2 Gy
Session Number: 30

## 2023-01-17 ENCOUNTER — Other Ambulatory Visit: Payer: Self-pay

## 2023-01-17 ENCOUNTER — Ambulatory Visit: Admission: RE | Admit: 2023-01-17 | Payer: Medicare HMO | Source: Ambulatory Visit

## 2023-01-17 DIAGNOSIS — Z170 Estrogen receptor positive status [ER+]: Secondary | ICD-10-CM | POA: Diagnosis not present

## 2023-01-17 DIAGNOSIS — C50212 Malignant neoplasm of upper-inner quadrant of left female breast: Secondary | ICD-10-CM | POA: Diagnosis not present

## 2023-01-17 DIAGNOSIS — Z510 Encounter for antineoplastic radiation therapy: Secondary | ICD-10-CM | POA: Diagnosis not present

## 2023-01-17 LAB — RAD ONC ARIA SESSION SUMMARY
Course Elapsed Days: 43
Plan Fractions Treated to Date: 3
Plan Prescribed Dose Per Fraction: 2 Gy
Plan Total Fractions Prescribed: 6
Plan Total Prescribed Dose: 12 Gy
Reference Point Dosage Given to Date: 6 Gy
Reference Point Session Dosage Given: 2 Gy
Session Number: 31

## 2023-01-18 ENCOUNTER — Inpatient Hospital Stay: Payer: Medicare HMO | Attending: Hematology and Oncology | Admitting: Hematology and Oncology

## 2023-01-18 ENCOUNTER — Other Ambulatory Visit: Payer: Self-pay

## 2023-01-18 ENCOUNTER — Ambulatory Visit
Admission: RE | Admit: 2023-01-18 | Discharge: 2023-01-18 | Disposition: A | Discharge status: Home / Self Care | Payer: Medicare HMO | Source: Ambulatory Visit | Attending: Radiation Oncology | Admitting: Radiation Oncology

## 2023-01-18 DIAGNOSIS — Z79899 Other long term (current) drug therapy: Secondary | ICD-10-CM | POA: Diagnosis not present

## 2023-01-18 DIAGNOSIS — Z79811 Long term (current) use of aromatase inhibitors: Secondary | ICD-10-CM | POA: Insufficient documentation

## 2023-01-18 DIAGNOSIS — Z87891 Personal history of nicotine dependence: Secondary | ICD-10-CM | POA: Diagnosis not present

## 2023-01-18 DIAGNOSIS — Z170 Estrogen receptor positive status [ER+]: Secondary | ICD-10-CM | POA: Diagnosis not present

## 2023-01-18 DIAGNOSIS — Z510 Encounter for antineoplastic radiation therapy: Secondary | ICD-10-CM | POA: Diagnosis not present

## 2023-01-18 DIAGNOSIS — C50212 Malignant neoplasm of upper-inner quadrant of left female breast: Secondary | ICD-10-CM | POA: Diagnosis not present

## 2023-01-18 LAB — RAD ONC ARIA SESSION SUMMARY
Course Elapsed Days: 44
Plan Fractions Treated to Date: 4
Plan Prescribed Dose Per Fraction: 2 Gy
Plan Total Fractions Prescribed: 6
Plan Total Prescribed Dose: 12 Gy
Reference Point Dosage Given to Date: 8 Gy
Reference Point Session Dosage Given: 2 Gy
Session Number: 32

## 2023-01-18 MED ORDER — ANASTROZOLE 1 MG PO TABS: 1.0000 mg | ORAL_TABLET | Freq: Every day | ORAL | 3 refills | Status: DC

## 2023-01-18 NOTE — Progress Notes (Signed)
[facility] CONSULT NOTE  Patient Care Team: Pincus Sanes, MD as PCP - General (Internal Medicine) Romero Belling, MD (Inactive) as Consulting Physician (Endocrinology) de Freida Busman, [place], OD as Consulting Physician (Optometry) Lenice Llamas, Garry Heater, [facility] (Inactive) as Pharmacist (Pharmacist) Rachel Moulds, MD as Consulting Physician (Hematology and Oncology) Emelia Loron, MD as Consulting Physician (General Surgery) Antony Blackbird, MD as Consulting Physician (Radiation Oncology) Pershing Proud, RN as Oncology Nurse Navigator Donnelly Angelica, RN as Oncology Nurse Navigator  CHIEF COMPLAINTS/PURPOSE OF CONSULTATION:  Newly diagnosed breast cancer  HISTORY OF PRESENTING ILLNESS:  Angie Garcia 72 y.o. female is here because of recent diagnosis of left breast cancer  I reviewed her records extensively and collaborated the history with the patient.  SUMMARY OF ONCOLOGIC HISTORY: Oncology History  Malignant neoplasm of upper-inner quadrant of left breast in female, estrogen receptor positive (HCC)  08/29/2022 Mammogram   Bilateral screening mammogram showed possible mass in the left breast warranting further evaluation.  No suspicious findings for malignancy in the right breast.  Diagnostic mammogram confirmed a suspicious mass in the left breast at 9:00 measuring 1.8 cm, suspicious left axillary lymph node.  Ultrasound confirmed the above-mentioned findings.   09/18/2022 Pathology Results   Pathology from the left breast needle core biopsy at 9:00 2 cm from nipple showed overall grade 2 invasive ductal carcinoma, prognostic showed ER 100% positive strong staining PR 75% positive moderate to strong staining Ki-67 of 2% and HER2 negative.  On the lymph node biopsy there is a comment with states that the nuclear features are similar but there is obvious ductular differentiation which is different compared to the primary specimen hence prognostics were also repeated on the  lymph node which once again confirmed ER 100% positive strong staining PR 30% positive moderate to strong staining Ki-67 of 5% and group 5 HER2 negative   09/25/2022 Initial Diagnosis   Malignant neoplasm of upper-inner quadrant of left breast in female, estrogen receptor positive   01/18/2023 Cancer Staging   Staging form: Breast, AJCC 8th Edition - Pathologic stage from 01/18/2023: Stage IB (pT1c, pN1, cM0, G3, ER+, PR+, HER2-) - Signed by Rachel Moulds, MD on 01/18/2023 Stage prefix: Initial diagnosis Histologic grading system: 3 grade system    [name] is here for follow-up after her surgery.  She had left breast lumpectomy and final pathology showed 1.9 x 1.9 x 1.5 cm grade 3 IDC, DCIS solid and cribriform type nuclear grade 2 out of 3, negative margins, 1 out of 3 lymph nodes positive for malignancy with focal extracapsular extension prognostic markers showed ER +100% strong PR +75% moderate to strong staining, HER2 negative by FISH, Ki-67 of 2%   Oncotype Dx of 17.  Based on Rx ponder study, and node positive women with Oncotype DX of 26, there may not be a definitive benefit of chemotherapy.    She is here for follow-up.  She is almost done with radiation and she had several questions regarding antiestrogen therapy.  She also had some questions regarding her pathology report, importance of estrogen and progesterone receptors.  MEDICAL HISTORY:  Past Medical History:  Diagnosis Date   Allergy    Arthritis    Baker's cyst    Left knee   Breast cancer (HCC)    Hx of adenomatous polyp of colon 05/09/2017   Hypertension    Lactose intolerance    Shingles 10/2008   RUE    SURGICAL HISTORY: Past Surgical History:  Procedure Laterality  Date   ABDOMINAL HYSTERECTOMY     Dr Roberto Scales for fibroids   BREAST BIOPSY Left 09/18/2022   [place] LT BREAST BX W LOC DEV 1ST LESION IMG BX SPEC US GUIDE 09/18/2022 [facility]   BREAST BIOPSY  10/10/2022   MM LT RADIOACTIVE SEED LOC MAMMO GUIDE  10/10/2022 [facility]   BREAST BIOPSY Left 10/10/2022   [place] LT RADIOACTIVE SEED LOC 10/10/2022 [facility]   BREAST LUMPECTOMY WITH RADIOACTIVE SEED AND SENTINEL LYMPH NODE BIOPSY Left 10/10/2022   Procedure: LEFT BREAST LUMPECTOMY WITH RADIOACTIVE SEED AND AXILLARY SENTINEL LYMPH NODE BIOPSY;  Surgeon: Emelia Loron, MD;  Location: [facility] OR;  Service: General;  Laterality: Left;   COLONOSCOPY  12/11/2005   lymphoid aggregate removed (suspected polyp was not), melanosis coli   PILONIDAL CYSTECTOMY     spine cyst   RADIOACTIVE SEED GUIDED AXILLARY SENTINEL LYMPH NODE Left 10/10/2022   Procedure: RADIOACTIVE SEED GUIDED LEFT AXILLARY SENTINEL LYMPH NODE EXCISION;  Surgeon: Emelia Loron, MD;  Location: [facility] OR;  Service: General;  Laterality: Left;    SOCIAL HISTORY: Social History   Socioeconomic History   Marital status: Married    Spouse name: [name]   Number of children: 1   Years of education: Not on file   Highest education level: Not on file  Occupational History   Occupation: retired    Associate Professor: [employer]  Tobacco Use   Smoking status: Former    Current packs/day: 0.00    Types: Cigarettes    Quit date: 06/27/1995    Years since quitting: 27.5   Smokeless tobacco: Never   Tobacco comments:    smoked [age], up to < 1 ppd  Vaping Use   Vaping status: Never Used  Substance and Sexual Activity   Alcohol use: No   Drug use: No   Sexual activity: Yes  Other Topics Concern   Not on file  Social History Narrative   REGULAR EXERCISE   Social Determinants of Health   Financial Resource Strain: Low Risk  (09/27/2022)   Overall Financial Resource Strain (CARDIA)    Difficulty of Paying Living Expenses: Not hard at all  Food Insecurity: No Food Insecurity (11/22/2022)   Hunger Vital Sign    Worried About Running Out of Food in the Last Year: Never true    Ran Out of Food in the Last Year: Never true  Transportation Needs: No Transportation Needs  (11/22/2022)   PRAPARE - Administrator, Civil Service (Medical): No    Lack of Transportation (Non-Medical): No  Physical Activity: Sufficiently Active (04/05/2022)   Exercise Vital Sign    Days of Exercise per Week: 5 days    Minutes of Exercise per Session: 30 min  Stress: No Stress Concern Present (04/05/2022)   [employer] of Occupational Health - Occupational Stress Questionnaire    Feeling of Stress : Not at all  Social Connections: Socially Integrated (04/05/2022)   Social Connection and Isolation Panel [NHANES]    Frequency of Communication with Friends and Family: More than three times a week    Frequency of Social Gatherings with Friends and Family: More than three times a week    Attends Religious Services: More than 4 times per year    Active Member of [employer] or Organizations: Yes    Attends Banker Meetings: More than 4 times per year    Marital Status: Married  Catering manager Violence: Not At Risk (11/22/2022)   Humiliation, Afraid, Rape, and  Kick questionnaire    Fear of Current or Ex-Partner: No    Emotionally Abused: No    Physically Abused: No    Sexually Abused: No    FAMILY HISTORY: Family History  Problem Relation Age of Onset   Hypertension Mother    Breast cancer Mother 62   Diabetes Father    Stroke Father        MINI STROKES   Stroke Sister 67   Diabetes Sister    Cancer Maternal Uncle        UNKNOWN TYPE   Colon cancer Neg Hx    Esophageal cancer Neg Hx    Pancreatic cancer Neg Hx    Rectal cancer Neg Hx    Stomach cancer Neg Hx    Hyperparathyroidism Neg Hx     ALLERGIES:  is allergic to sulfamethoxazole-trimethoprim.  MEDICATIONS:  Current Outpatient Medications  Medication Sig Dispense Refill   anastrozole (ARIMIDEX) 1 MG tablet Take 1 tablet (1 mg total) by mouth daily. 90 tablet 3   cetirizine (ZYRTEC) 10 MG tablet Take 10 mg by mouth daily as needed for allergies.     diltiazem (CARDIZEM CD) 120 MG 24  hr capsule TAKE ONE CAPSULE BY MOUTH ONE TIME DAILY 90 capsule 3   lisinopril (ZESTRIL) 20 MG tablet TAKE ONE TABLET BY MOUTH ONE TIME DAILY 90 tablet 3   traMADol (ULTRAM) 50 MG tablet Take 1 tablet (50 mg total) by mouth every 6 (six) hours as needed. (Patient not taking: Reported on 11/22/2022) 10 tablet 0   TURMERIC PO Take 1 capsule by mouth daily.     No current facility-administered medications for this visit.    REVIEW OF SYSTEMS:   Constitutional: Denies fevers, chills or abnormal night sweats Eyes: Denies blurriness of vision, double vision or watery eyes Ears, nose, mouth, throat, and face: Denies mucositis or sore throat Respiratory: Denies cough, dyspnea or wheezes Cardiovascular: Denies palpitation, chest discomfort or lower extremity swelling Gastrointestinal:  Denies nausea, heartburn or change in bowel habits Skin: Denies abnormal skin rashes Lymphatics: Denies new lymphadenopathy or easy bruising Neurological:Denies numbness, tingling or new weaknesses Behavioral/Psych: Mood is stable, no new changes  Breast: Denies any palpable lumps or discharge All other systems were reviewed with the patient and are negative.  PHYSICAL EXAMINATION: ECOG PERFORMANCE STATUS: 0 - Asymptomatic  There were no vitals filed for this visit.  There were no vitals filed for this visit.   GENERAL:alert, no distress and comfortable   LABORATORY DATA:  I have reviewed the data as listed Lab Results  Component Value Date   WBC 5.7 09/27/2022   HGB 13.4 09/27/2022   HCT 41.7 09/27/2022   MCV 87.2 09/27/2022   PLT 369 09/27/2022   Lab Results  Component Value Date   NA 142 09/27/2022   K 3.9 09/27/2022   CL 110 09/27/2022   CO2 26 09/27/2022    RADIOGRAPHIC STUDIES: I have personally reviewed the radiological reports and agreed with the findings in the report.  ASSESSMENT AND PLAN:  Malignant neoplasm of upper-inner quadrant of left breast in female, estrogen receptor  positive (HCC) This is a very pleasant 72 year old postmenopausal female patient with newly diagnosed left breast invasive ductal carcinoma, grade 2, ER/PR positive HER2 with lymph node involvement referred to breast M[facility] for additional recommendations.   She had lumpectomy which showed 1.9 x 1.9 x 1.5 cm grade 3 IDC, DCIS solid and cribriform type, nuclear grade 2 out of 3, negative margins, 1 out  of 3 lymph nodes positive for malignancy with focal extracapsular extension.  Prognostic marker shows ER 100% strong PR 75% moderate to strong staining, HER2 negative by FISH, Ki-67 of 2%. Oncotype DX of 17, no definitive benefit from chemotherapy in postmenopausal women with 1-3 positive lymph nodes based on Rx ponder trial. She is now status post adjuvant radiation and is here to initiate antiestrogen therapy.  She once again had excellent questions about the pathology report.  We have reviewed antiestrogen therapy options today once again in detail.  We have discussed tamoxifen versus aromatase inhibitors, adverse effects with each class.  I have tried to clarify the pathology report and answered all her questions.  She wanted some printed information about the anastrozole.  She is willing to start it about 2 to 3 weeks after she completes radiation.  She should do a baseline bone density and return to clinic in approximately 3 weeks   Total time 30 minutes including history, physical exam, review of records, counseling and coordination of care All questions were answered. The patient knows to call the clinic with any problems, questions or concerns.    Rachel Moulds, MD 01/19/23

## 2023-01-19 ENCOUNTER — Telehealth: Payer: Self-pay | Admitting: Adult Health

## 2023-01-19 ENCOUNTER — Other Ambulatory Visit: Payer: Self-pay

## 2023-01-19 ENCOUNTER — Ambulatory Visit
Admission: RE | Admit: 2023-01-19 | Discharge: 2023-01-19 | Disposition: A | Discharge status: Home / Self Care | Payer: Medicare HMO | Source: Ambulatory Visit | Attending: Radiation Oncology | Admitting: Radiation Oncology

## 2023-01-19 DIAGNOSIS — Z510 Encounter for antineoplastic radiation therapy: Secondary | ICD-10-CM | POA: Diagnosis not present

## 2023-01-19 DIAGNOSIS — C50212 Malignant neoplasm of upper-inner quadrant of left female breast: Secondary | ICD-10-CM | POA: Diagnosis not present

## 2023-01-19 DIAGNOSIS — Z170 Estrogen receptor positive status [ER+]: Secondary | ICD-10-CM | POA: Diagnosis not present

## 2023-01-19 LAB — RAD ONC ARIA SESSION SUMMARY
Course Elapsed Days: 45
Plan Fractions Treated to Date: 5
Plan Prescribed Dose Per Fraction: 2 Gy
Plan Total Fractions Prescribed: 6
Plan Total Prescribed Dose: 12 Gy
Reference Point Dosage Given to Date: 10 Gy
Reference Point Session Dosage Given: 2 Gy
Session Number: 33

## 2023-01-19 NOTE — Telephone Encounter (Signed)
Called patient on both home and mobile contacts; Left a message regarding upcoming appointment times and dates

## 2023-01-19 NOTE — Assessment & Plan Note (Signed)
This is a very pleasant 72 year old postmenopausal female patient with newly diagnosed left breast invasive ductal carcinoma, grade 2, ER/PR positive HER2 with lymph node involvement referred to breast MDC for additional recommendations.   She had lumpectomy which showed 1.9 x 1.9 x 1.5 cm grade 3 IDC, DCIS solid and cribriform type, nuclear grade 2 out of 3, negative margins, 1 out of 3 lymph nodes positive for malignancy with focal extracapsular extension.  Prognostic marker shows ER 100% strong PR 75% moderate to strong staining, HER2 negative by FISH, Ki-67 of 2%. Oncotype DX of 17, no definitive benefit from chemotherapy in postmenopausal women with 1-3 positive lymph nodes based on Rx ponder trial. She is now status post adjuvant radiation and is here to initiate antiestrogen therapy.  She once again had excellent questions about the pathology report.  We have reviewed antiestrogen therapy options today once again in detail.  We have discussed tamoxifen versus aromatase inhibitors, adverse effects with each class.  I have tried to clarify the pathology report and answered all her questions.  She wanted some printed information about the anastrozole.  She is willing to start it about 2 to 3 weeks after she completes radiation.  She should do a baseline bone density and return to clinic in approximately 3 weeks

## 2023-01-22 ENCOUNTER — Ambulatory Visit: Payer: Medicare HMO

## 2023-01-22 ENCOUNTER — Ambulatory Visit
Admission: RE | Admit: 2023-01-22 | Discharge: 2023-01-22 | Disposition: A | Discharge status: Home / Self Care | Payer: Medicare HMO | Source: Ambulatory Visit | Attending: Radiation Oncology | Admitting: Radiation Oncology

## 2023-01-22 ENCOUNTER — Other Ambulatory Visit: Payer: Self-pay

## 2023-01-22 DIAGNOSIS — C50212 Malignant neoplasm of upper-inner quadrant of left female breast: Secondary | ICD-10-CM | POA: Diagnosis not present

## 2023-01-22 DIAGNOSIS — Z510 Encounter for antineoplastic radiation therapy: Secondary | ICD-10-CM | POA: Diagnosis not present

## 2023-01-22 DIAGNOSIS — Z170 Estrogen receptor positive status [ER+]: Secondary | ICD-10-CM | POA: Diagnosis not present

## 2023-01-22 LAB — RAD ONC ARIA SESSION SUMMARY
Course Elapsed Days: 48
Plan Fractions Treated to Date: 6
Plan Prescribed Dose Per Fraction: 2 Gy
Plan Total Fractions Prescribed: 6
Plan Total Prescribed Dose: 12 Gy
Reference Point Dosage Given to Date: 12 Gy
Reference Point Session Dosage Given: 2 Gy
Session Number: 34

## 2023-01-23 ENCOUNTER — Ambulatory Visit: Payer: Medicare HMO

## 2023-01-23 ENCOUNTER — Telehealth: Payer: Self-pay | Admitting: Adult Health

## 2023-01-24 NOTE — Radiation Completion Notes (Signed)
Patient Name: Angie Garcia, Angie Garcia MRN: [id number] Date of Birth: Aug 07, 1950 Referring Physician: Rachel Moulds, M.D. Date of Service: 2023-01-24 Radiation Oncologist: Arnette Schaumann, M.D.  [facility]                             RADIATION ONCOLOGY END OF TREATMENT NOTE     Diagnosis: C50.212 Malignant neoplasm of upper-inner quadrant of left female breast Staging on 2023-01-18: Malignant neoplasm of upper-inner quadrant of left breast in female, estrogen receptor positive (HCC) T=pT1c, N=pN1, M=cM0 Intent: Curative     ==========DELIVERED PLANS==========  First Treatment Date: 2022-12-05 - Last Treatment Date: 2023-01-22   Plan Name: Breast_L_BH Site: Breast, Left Technique: 3D Mode: Photon Dose Per Fraction: 1.8 Gy Prescribed Dose (Delivered / Prescribed): 50.4 Gy / 50.4 Gy Prescribed Fxs (Delivered / Prescribed): 28 / 28   Plan Name: Brst_L_Scv_BH Site: Breast, Left Technique: 3D Mode: Photon Dose Per Fraction: 1.8 Gy Prescribed Dose (Delivered / Prescribed): 50.4 Gy / 50.4 Gy Prescribed Fxs (Delivered / Prescribed): 28 / 28   Plan Name: Brst_L_Bst_BH Site: Breast, Left Technique: 3D Mode: Photon Dose Per Fraction: 2 Gy Prescribed Dose (Delivered / Prescribed): 12 Gy / 12 Gy Prescribed Fxs (Delivered / Prescribed): 6 / 6     ==========ON TREATMENT VISIT DATES========== 2022-12-11, 2022-12-14, 2022-12-19, 2022-12-26, 2023-01-02, 2023-01-08, 2023-01-15, 2023-01-22     ==========UPCOMING VISITS==========       ==========APPENDIX - ON TREATMENT VISIT NOTES==========   See weekly On Treatment Notes in Epic for details.

## 2023-01-31 ENCOUNTER — Ambulatory Visit: Payer: Medicare HMO | Attending: Hematology and Oncology

## 2023-01-31 VITALS — Wt 167.1 lb

## 2023-01-31 DIAGNOSIS — Z483 Aftercare following surgery for neoplasm: Secondary | ICD-10-CM | POA: Insufficient documentation

## 2023-01-31 NOTE — Therapy (Signed)
OUTPATIENT PHYSICAL THERAPY SOZO SCREENING NOTE   Patient Name: Angie Garcia MRN: [id number] DOB:February 28, 1951, 72 y.o., female Today's Date: 01/31/2023  PCP: Pincus Sanes, MD REFERRING PROVIDER: Rachel Moulds, MD   PT End of Session - 01/31/23 1154     Visit Number 2   # unchanged due to screen only   PT Start Time 1147    PT Stop Time 1154    PT Time Calculation (min) 7 min    Activity Tolerance Patient tolerated treatment well    Behavior During Therapy [facility] for tasks assessed/performed             Past Medical History:  Diagnosis Date   Allergy    Arthritis    Baker's cyst    Left knee   Breast cancer (HCC)    Hx of adenomatous polyp of colon 05/09/2017   Hypertension    Lactose intolerance    Shingles 10/2008   RUE   Past Surgical History:  Procedure Laterality Date   ABDOMINAL HYSTERECTOMY     Dr Roberto Scales for fibroids   BREAST BIOPSY Left 09/18/2022   [place] LT BREAST BX W LOC DEV 1ST LESION IMG BX SPEC US GUIDE 09/18/2022 [facility]   BREAST BIOPSY  10/10/2022   MM LT RADIOACTIVE SEED LOC MAMMO GUIDE 10/10/2022 [facility]   BREAST BIOPSY Left 10/10/2022   [place] LT RADIOACTIVE SEED LOC 10/10/2022 [facility]   BREAST LUMPECTOMY WITH RADIOACTIVE SEED AND SENTINEL LYMPH NODE BIOPSY Left 10/10/2022   Procedure: LEFT BREAST LUMPECTOMY WITH RADIOACTIVE SEED AND AXILLARY SENTINEL LYMPH NODE BIOPSY;  Surgeon: Emelia Loron, MD;  Location: [facility] OR;  Service: General;  Laterality: Left;   COLONOSCOPY  12/11/2005   lymphoid aggregate removed (suspected polyp was not), melanosis coli   PILONIDAL CYSTECTOMY     spine cyst   RADIOACTIVE SEED GUIDED AXILLARY SENTINEL LYMPH NODE Left 10/10/2022   Procedure: RADIOACTIVE SEED GUIDED LEFT AXILLARY SENTINEL LYMPH NODE EXCISION;  Surgeon: Emelia Loron, MD;  Location: [facility] OR;  Service: General;  Laterality: Left;   Patient Active Problem List   Diagnosis Date Noted   Malignant neoplasm of upper-inner quadrant  of left breast in female, estrogen receptor positive (HCC) 09/25/2022   Osteopenia 04/10/2021   Hyperparathyroidism, primary (HCC) 03/14/2018   Multinodular goiter 03/14/2018   Hx of adenomatous polyp of colon 05/09/2017   Cyst of left kidney 10/21/2015   Prediabetes 08/19/2015   Hyperlipidemia 06/02/2014   DJD (degenerative joint disease) of knee 04/01/2013   Hemorrhoids, internal, with bleeding 07/04/2011   LACTOSE INTOLERANCE 11/18/2009   RHINITIS 11/18/2009   History of herpes zoster 10/28/2008   Essential hypertension 01/24/2008    REFERRING DIAG: left breast cancer at risk for lymphedema  THERAPY DIAG:  Aftercare following surgery for neoplasm  PERTINENT HISTORY: Lt lumpectomy due to [facility] ER/PR positive breast cancer on 10/10/22. 1/6 positive nodes removed. Oncotype 17 and will not be getting chemotherapy  PRECAUTIONS: left UE Lymphedema risk, None  SUBJECTIVE: Pt returns for her first 3 month L-Dex screen. "I just finished radiation last week."  PAIN:  Are you having pain? No  SOZO SCREENING: Patient was assessed today using the SOZO machine to determine the lymphedema index score. This was compared to her baseline score. It was determined that she is within the recommended range when compared to her baseline and no further action is needed at this time. She will continue SOZO screenings. These are done every 3 months for 2 years post operatively  followed by every 6 months for 2 years, and then annually. Answered pts questions about SOZO.    L-DEX FLOWSHEETS - 01/31/23 1100       L-DEX LYMPHEDEMA SCREENING   Measurement Type Unilateral    L-DEX MEASUREMENT EXTREMITY Upper Extremity    POSITION  Standing    DOMINANT SIDE Right    At Risk Side Left    BASELINE SCORE (UNILATERAL) 0.9    L-DEX SCORE (UNILATERAL) 6.5    VALUE CHANGE (UNILAT) 5.6               Hermenia Bers, PTA 01/31/2023, 11:59 AM

## 2023-02-21 ENCOUNTER — Encounter: Payer: Self-pay | Admitting: Radiation Oncology

## 2023-02-21 NOTE — Progress Notes (Signed)
Radiation Oncology         [phone] ________________________________  Name: Angie Garcia MRN: [id number]  Date: 02/22/2023  DOB: Apr 05, 1951  Follow-Up Visit Note  CC: Pincus Sanes, MD  Pincus Sanes, MD  No diagnosis found.  Diagnosis: Stage pT1c, pN1, cM0 Left Breast UIQ, Invasive and in situ ductal carcinoma, ER+ / PR+ / Her2-, Grade 2: s/p lumpectomy and SLN evaluation with SLN involvement x 1      Interval Since Last Radiation: 1 month   Indication for treatment: Curative       Radiation treatment dates: 12/05/22 through 01/22/23  Site/dose:   1) Left breast - 50.4 Gy delivered in 28 Fx at 1.8 Gy/Fx 2) Left breast_SCV - 50.4 Gy delivered in 28 Fx at 1.8 Gy/Fx 3) Left breast boost - 12 Gy delivered in 6 Fx at 2 Gy/Fx Technique/Mode: 3D / Photon  Beams/energy: 6X and 10X  Narrative:  The patient returns today for routine follow-up. The patient tolerated radiation treatment relatively well. During her final weekly treatment check on 01/22/23, the patient endorsed some tenderness and an occasional shooting pain in her breast, hyperpigmentation changes, and areas of peeling which are slowly healing. Physical exam performed that same date showed hyperpigmented areas with some areas of peeling in the left breast and supraclavicular area. No moist desquamation was appreciated.     In the interval, the patient followed up with Dr. Al Pimple on 01/18/23. During which time, the patient agreed to try antiestrogen therapy consisting of anastrozole. She began anastrozole approximately 2-3 weeks after her final radiation treatment.              No other significant interval history since the patient completed radiation therapy, or in the interval since her initial consultation date.            ***        Allergies:  is allergic to sulfamethoxazole-trimethoprim.  Meds: Current Outpatient Medications  Medication Sig Dispense Refill   anastrozole (ARIMIDEX) 1 MG tablet Take 1 tablet (1  mg total) by mouth daily. 90 tablet 3   cetirizine (ZYRTEC) 10 MG tablet Take 10 mg by mouth daily as needed for allergies.     diltiazem (CARDIZEM CD) 120 MG 24 hr capsule TAKE ONE CAPSULE BY MOUTH ONE TIME DAILY 90 capsule 3   lisinopril (ZESTRIL) 20 MG tablet TAKE ONE TABLET BY MOUTH ONE TIME DAILY 90 tablet 3   traMADol (ULTRAM) 50 MG tablet Take 1 tablet (50 mg total) by mouth every 6 (six) hours as needed. (Patient not taking: Reported on 11/22/2022) 10 tablet 0   TURMERIC PO Take 1 capsule by mouth daily.     No current facility-administered medications for this encounter.    Physical Findings: The patient is in no acute distress. Patient is alert and oriented.  vitals were not taken for this visit. .  No significant changes. Lungs are clear to auscultation bilaterally. Heart has regular rate and rhythm. No palpable cervical, supraclavicular, or axillary adenopathy. Abdomen soft, non-tender, normal bowel sounds.  Right Breast: no palpable mass, nipple discharge or bleeding. Left Breast: ***  Lab Findings: Lab Results  Component Value Date   WBC 5.7 09/27/2022   HGB 13.4 09/27/2022   HCT 41.7 09/27/2022   MCV 87.2 09/27/2022   PLT 369 09/27/2022    Radiographic Findings: No results found.  Impression: Stage pT1c, pN1, cM0 Left Breast UIQ, Invasive and in situ ductal carcinoma, ER+ / PR+ / Her2-,  Grade 2: s/p lumpectomy and SLN evaluation with SLN involvement x 1      The patient is recovering from the effects of radiation.  ***  Plan:  ***   *** minutes of total time was spent for this patient encounter, including preparation, face-to-face counseling with the patient and coordination of care, physical exam, and documentation of the encounter. ____________________________________  Billie Lade, PhD, MD  This document serves as a record of services personally performed by Antony Blackbird, MD. It was created on his behalf by Neena Rhymes, a trained medical scribe. The  creation of this record is based on the scribe's personal observations and the provider's statements to them. This document has been checked and approved by the attending provider.

## 2023-02-21 NOTE — Progress Notes (Signed)
  Radiation Oncology         [phone] ________________________________  Name: Angie Garcia MRN: [id number]  Date: 02/22/2023  DOB: [birth year]  End of Treatment Note  Diagnosis: Stage pT1c, pN1, cM0 Left Breast UIQ, Invasive and in situ ductal carcinoma, ER+ / PR+ / Her2-, Grade 2: s/p lumpectomy and SLN evaluation with SLN involvement x 1      Indication for treatment: Curative        Radiation treatment dates: 12/05/22 through 01/22/23   Site/dose:   1) Left breast - 50.4 Gy delivered in 28 Fx at 1.8 Gy/Fx 2) Left breast_SCV - 50.4 Gy delivered in 28 Fx at 1.8 Gy/Fx 3) Left breast boost - 12 Gy delivered in 6 Fx at 2 Gy/Fx  Technique/Mode: 3D / Photon   Beams/energy: 6X and 10X  Narrative: The patient tolerated radiation treatment relatively well. During her final weekly treatment check on 01/22/23, the patient endorsed some tenderness and occasional shooting pain in her breast, hyperpigmentation changes, and areas of peeling which are slowly healing. Physical exam performed that same date showed hyperpigmented areas with some areas of peeling in the left breast and supraclavicular area. No moist desquamation was appreciated.   Plan: The patient has completed radiation treatment. The patient will return to radiation oncology clinic for routine followup in one month. I advised them to call or return sooner if they have any questions or concerns related to their recovery or treatment.  -----------------------------------  Billie Lade, PhD, MD  This document serves as a record of services personally performed by Antony Blackbird, MD. It was created on his behalf by Neena Rhymes, a trained medical scribe. The creation of this record is based on the scribe's personal observations and the provider's statements to them. This document has been checked and approved by the attending provider.

## 2023-02-22 ENCOUNTER — Encounter: Payer: Self-pay | Admitting: Radiation Oncology

## 2023-02-22 ENCOUNTER — Ambulatory Visit
Admission: RE | Admit: 2023-02-22 | Discharge: 2023-02-22 | Disposition: A | Discharge status: Home / Self Care | Payer: Medicare HMO | Source: Ambulatory Visit | Attending: Radiation Oncology | Admitting: Radiation Oncology

## 2023-02-22 VITALS — BP 133/78 | HR 75 | Temp 96.8°F | Resp 18 | Ht 67.0 in | Wt 166.4 lb

## 2023-02-22 DIAGNOSIS — Z170 Estrogen receptor positive status [ER+]: Secondary | ICD-10-CM

## 2023-02-22 DIAGNOSIS — C50212 Malignant neoplasm of upper-inner quadrant of left female breast: Secondary | ICD-10-CM

## 2023-02-22 DIAGNOSIS — C50211 Malignant neoplasm of upper-inner quadrant of right female breast: Secondary | ICD-10-CM | POA: Insufficient documentation

## 2023-02-22 DIAGNOSIS — Z923 Personal history of irradiation: Secondary | ICD-10-CM | POA: Diagnosis not present

## 2023-02-22 HISTORY — DX: Personal history of irradiation: Z92.3

## 2023-02-22 NOTE — Progress Notes (Signed)
Angie Garcia is here today for follow up post radiation to the breast.   Breast Side:Left   They completed their radiation on: 01/22/23   Does the patient complain of any of the following: Post radiation skin issues: Skin is improving. Patient continues to apply Aquaphor ointment to breast.  Breast Tenderness: No Breast Swelling: No Lymphadema: No Range of Motion limitations: No Fatigue post radiation: No Appetite good/fair/poor: Good  Additional comments if applicable:  Patient started taking anastrozole last week.   BP 133/78 (BP Location: Right Arm, Patient Position: Sitting)   Pulse 75   Temp (!) 96.8 F (36 C) (Temporal)   Resp 18   Ht 5\' 7"  (1.702 m)   Wt 166 lb 6 oz (75.5 kg)   SpO2 100%   BMI 26.06 kg/m

## 2023-03-13 ENCOUNTER — Telehealth: Payer: Self-pay | Admitting: Adult Health

## 2023-03-13 NOTE — Telephone Encounter (Signed)
Patient is aware of rescheduled appointment times/dates

## 2023-04-19 ENCOUNTER — Encounter: Payer: Self-pay | Admitting: Internal Medicine

## 2023-04-19 NOTE — Progress Notes (Signed)
Subjective:    Patient ID: Angie Garcia, female    DOB: 1951-03-29, 72 y.o.   MRN: [id number]      HPI Angie Garcia is here for a Physical exam and her chronic medical problems.    Since she was here - dx with lumpectomy, s/p radiation and on anastrozole.  She is a little overwhelmed since going through illness and at this point does not want to consider a bone density or pneumonia vaccine.  She denies any depression or anxiety.  Overall she feels she is doing well and has no concerns.  Medications and allergies reviewed with patient and updated if appropriate.  Current Outpatient Medications on File Prior to Visit  Medication Sig Dispense Refill   anastrozole (ARIMIDEX) 1 MG tablet Take 1 tablet (1 mg total) by mouth daily. 90 tablet 3   cetirizine (ZYRTEC) 10 MG tablet Take 10 mg by mouth daily as needed for allergies.     diltiazem (CARDIZEM CD) 120 MG 24 hr capsule TAKE ONE CAPSULE BY MOUTH ONE TIME DAILY 90 capsule 3   lisinopril (ZESTRIL) 20 MG tablet TAKE ONE TABLET BY MOUTH ONE TIME DAILY 90 tablet 3   TURMERIC PO Take 1 capsule by mouth daily.     No current facility-administered medications on file prior to visit.    Review of Systems  Constitutional:  Negative for fever.  Eyes:  Negative for visual disturbance.  Respiratory:  Negative for cough, shortness of breath and wheezing.   Cardiovascular:  Negative for chest pain, palpitations and leg swelling.  Gastrointestinal:  Negative for abdominal pain, blood in stool, constipation and diarrhea.       No gerd  Genitourinary:  Negative for dysuria.  Musculoskeletal:  Positive for arthralgias. Negative for back pain.  Skin:  Negative for rash.  Neurological:  Negative for light-headedness and headaches.  Psychiatric/Behavioral:  Negative for dysphoric mood. The patient is not nervous/anxious.        Objective:   Vitals:   04/20/23 1527 04/20/23 1534  BP: (!) 130/90 128/78  Pulse: 60   Temp: 98.1 F (36.7 C)    SpO2: 97%    Filed Weights   04/20/23 1527  Weight: 166 lb (75.3 kg)   Body mass index is 27.62 kg/m.  BP Readings from Last 3 Encounters:  04/20/23 128/78  02/22/23 133/78  11/22/22 115/62    Wt Readings from Last 3 Encounters:  04/20/23 166 lb (75.3 kg)  04/20/23 166 lb (75.3 kg)  02/22/23 166 lb 6 oz (75.5 kg)       Physical Exam Constitutional: She appears well-developed and well-nourished. No distress.  HENT:  Head: Normocephalic and atraumatic.  Right Ear: External ear normal. Normal ear canal and TM Left Ear: External ear normal.  Normal ear canal and TM Mouth/Throat: Oropharynx is clear and moist.  Eyes: Conjunctivae normal.  Neck: Neck supple. No tracheal deviation present. No thyromegaly present.  No carotid bruit  Cardiovascular: Normal rate, regular rhythm and normal heart sounds.   No murmur heard.  No edema. Pulmonary/Chest: Effort normal and breath sounds normal. No respiratory distress. She has no wheezes. She has no rales.  Breast: deferred   Abdominal: Soft. She exhibits no distension. There is no tenderness.  Lymphadenopathy: She has no cervical adenopathy.  Skin: Skin is warm and dry. She is not diaphoretic.  Psychiatric: She has a normal mood and affect. Her behavior is normal.     Lab Results  Component Value Date  WBC 5.7 09/27/2022   HGB 13.4 09/27/2022   HCT 41.7 09/27/2022   PLT 369 09/27/2022   GLUCOSE 115 (H) 09/27/2022   CHOL 213 (H) 04/18/2022   TRIG 50.0 04/18/2022   HDL 72.60 04/18/2022   LDLDIRECT 120.1 09/09/2012   LDLCALC 130 (H) 04/18/2022   ALT 11 09/27/2022   AST 13 (L) 09/27/2022   NA 142 09/27/2022   K 3.9 09/27/2022   CL 110 09/27/2022   CREATININE 0.90 09/27/2022   BUN 19 09/27/2022   CO2 26 09/27/2022   TSH 0.51 04/18/2022   HGBA1C 5.8 04/18/2022    The 10-year ASCVD risk score (Arnett DK, et al., 2019) is: 13%   Values used to calculate the score:     Age: 42 years     Sex: Female     Is  Non-Hispanic African American: Yes     Diabetic: No     Tobacco smoker: No     Systolic Blood Pressure: 128 mmHg     Is BP treated: Yes     HDL Cholesterol: 72.6 mg/dL     Total Cholesterol: 213 mg/dL      Assessment & Plan:   Physical exam: Screening blood work  ordered Exercise  hiking every Wednesday, cares for her grankid, walks her dog Weight  is ok Substance abuse  none   Reviewed recommended immunizations.   Health Maintenance  Topic Date Due   Zoster Vaccines- Shingrix (1 of 2) 07/21/2023 (Originally 12/13/1969)   COVID-19 Vaccine (5 - 2023-24 season) 09/24/2023 (Originally 06/01/2023)   Pneumonia Vaccine 68+ Years old (1 of 1 - PCV) 04/19/2024 (Originally 12/12/2017)   DEXA SCAN  04/19/2024 (Originally 03/18/2021)   Medicare Annual Wellness (AWV)  04/19/2024   Colonoscopy  05/03/2024   MAMMOGRAM  08/28/2024   DTaP/Tdap/Td (3 - Td or Tdap) 02/10/2025   INFLUENZA VACCINE  Completed   Hepatitis C Screening  Completed   HPV VACCINES  Aged Out      Deferred pneumonia vaccine, DEXA scan-will consider these in the spring    See Problem List for Assessment and Plan of chronic medical problems.

## 2023-04-19 NOTE — Patient Instructions (Addendum)
Blood work was ordered.   The lab is on the first floor.    Medications changes include :   start taking calcium and vitamin d  or  a multivitamin     Return in about 6 months (around 10/19/2023) for follow up.   Health Maintenance, Female Adopting a healthy lifestyle and getting preventive care are important in promoting health and wellness. Ask your health care provider about: The right schedule for you to have regular tests and exams. Things you can do on your own to prevent diseases and keep yourself healthy. What should I know about diet, weight, and exercise? Eat a healthy diet  Eat a diet that includes plenty of vegetables, fruits, low-fat dairy products, and lean protein. Do not eat a lot of foods that are high in solid fats, added sugars, or sodium. Maintain a healthy weight Body mass index (BMI) is used to identify weight problems. It estimates body fat based on height and weight. Your health care provider can help determine your BMI and help you achieve or maintain a healthy weight. Get regular exercise Get regular exercise. This is one of the most important things you can do for your health. Most adults should: Exercise for at least 150 minutes each week. The exercise should increase your heart rate and make you sweat (moderate-intensity exercise). Do strengthening exercises at least twice a week. This is in addition to the moderate-intensity exercise. Spend less time sitting. Even light physical activity can be beneficial. Watch cholesterol and blood lipids Have your blood tested for lipids and cholesterol at 70 years of age, then have this test every 5 years. Have your cholesterol levels checked more often if: Your lipid or cholesterol levels are high. You are older than 70 years of age. You are at high risk for heart disease. What should I know about cancer screening? Depending on your health history and family history, you may need to have cancer screening  at various ages. This may include screening for: Breast cancer. Cervical cancer. Colorectal cancer. Skin cancer. Lung cancer. What should I know about heart disease, diabetes, and high blood pressure? Blood pressure and heart disease High blood pressure causes heart disease and increases the risk of stroke. This is more likely to develop in people who have high blood pressure readings or are overweight. Have your blood pressure checked: Every 3-5 years if you are 55-48 years of age. Every year if you are 13 years old or older. Diabetes Have regular diabetes screenings. This checks your fasting blood sugar level. Have the screening done: Once every three years after age 109 if you are at a normal weight and have a low risk for diabetes. More often and at a younger age if you are overweight or have a high risk for diabetes. What should I know about preventing infection? Hepatitis B If you have a higher risk for hepatitis B, you should be screened for this virus. Talk with your health care provider to find out if you are at risk for hepatitis B infection. Hepatitis C Testing is recommended for: Everyone born from [birth year] through 1963. Anyone with known risk factors for hepatitis C. Sexually transmitted infections (STIs) Get screened for STIs, including gonorrhea and chlamydia, if: You are sexually active and are younger than 70 years of age. You are older than 70 years of age and your health care provider tells you that you are at risk for this type of infection. Your sexual activity has  changed since you were last screened, and you are at increased risk for chlamydia or gonorrhea. Ask your health care provider if you are at risk. Ask your health care provider about whether you are at high risk for HIV. Your health care provider may recommend a prescription medicine to help prevent HIV infection. If you choose to take medicine to prevent HIV, you should first get tested for HIV. You should then  be tested every 3 months for as long as you are taking the medicine. Pregnancy If you are about to stop having your period (premenopausal) and you may become pregnant, seek counseling before you get pregnant. Take 400 to 800 micrograms (mcg) of folic acid every day if you become pregnant. Ask for birth control (contraception) if you want to prevent pregnancy. Osteoporosis and menopause Osteoporosis is a disease in which the bones lose minerals and strength with aging. This can result in bone fractures. If you are 80 years old or older, or if you are at risk for osteoporosis and fractures, ask your health care provider if you should: Be screened for bone loss. Take a calcium or vitamin D supplement to lower your risk of fractures. Be given hormone replacement therapy (HRT) to treat symptoms of menopause. Follow these instructions at home: Alcohol use Do not drink alcohol if: Your health care provider tells you not to drink. You are pregnant, may be pregnant, or are planning to become pregnant. If you drink alcohol: Limit how much you have to: 0-1 drink a day. Know how much alcohol is in your drink. In the [place] one drink equals one 12 oz bottle of beer (355 mL), one 5 oz glass of wine (148 mL), or one 1 oz glass of hard liquor (44 mL). Lifestyle Do not use any products that contain nicotine or tobacco. These products include cigarettes, chewing tobacco, and vaping devices, such as e-cigarettes. If you need help quitting, ask your health care provider. Do not use street drugs. Do not share needles. Ask your health care provider for help if you need support or information about quitting drugs. General instructions Schedule regular health, dental, and eye exams. Stay current with your vaccines. Tell your health care provider if: You often feel depressed. You have ever been abused or do not feel safe at home. Summary Adopting a healthy lifestyle and getting preventive care are important in  promoting health and wellness. Follow your health care provider's instructions about healthy diet, exercising, and getting tested or screened for diseases. Follow your health care provider's instructions on monitoring your cholesterol and blood pressure. This information is not intended to replace advice given to you by your health care provider. Make sure you discuss any questions you have with your health care provider. Document Revised: 11/01/2020 Document Reviewed: 11/01/2020 Elsevier Patient Education  2024 [employer].

## 2023-04-20 ENCOUNTER — Ambulatory Visit (INDEPENDENT_AMBULATORY_CARE_PROVIDER_SITE_OTHER): Payer: Medicare HMO | Admitting: Internal Medicine

## 2023-04-20 ENCOUNTER — Ambulatory Visit (INDEPENDENT_AMBULATORY_CARE_PROVIDER_SITE_OTHER): Payer: Medicare HMO

## 2023-04-20 VITALS — Ht 65.0 in | Wt 166.0 lb

## 2023-04-20 VITALS — BP 128/78 | HR 60 | Temp 98.1°F | Ht 65.0 in | Wt 166.0 lb

## 2023-04-20 DIAGNOSIS — R7303 Prediabetes: Secondary | ICD-10-CM

## 2023-04-20 DIAGNOSIS — E782 Mixed hyperlipidemia: Secondary | ICD-10-CM | POA: Diagnosis not present

## 2023-04-20 DIAGNOSIS — M8589 Other specified disorders of bone density and structure, multiple sites: Secondary | ICD-10-CM

## 2023-04-20 DIAGNOSIS — Z780 Asymptomatic menopausal state: Secondary | ICD-10-CM

## 2023-04-20 DIAGNOSIS — E042 Nontoxic multinodular goiter: Secondary | ICD-10-CM | POA: Diagnosis not present

## 2023-04-20 DIAGNOSIS — Z0000 Encounter for general adult medical examination without abnormal findings: Secondary | ICD-10-CM

## 2023-04-20 DIAGNOSIS — I10 Essential (primary) hypertension: Secondary | ICD-10-CM

## 2023-04-20 LAB — LIPID PANEL
Cholesterol: 232 mg/dL — ABNORMAL HIGH (ref 0–200)
HDL: 78 mg/dL (ref >39.00)
LDL Cholesterol: 142 mg/dL — ABNORMAL HIGH (ref 0–99)
NonHDL: 153.72
Total CHOL/HDL Ratio: 3
Triglycerides: 59 mg/dL (ref 0.0–149.0)
VLDL: 11.8 mg/dL (ref 0.0–40.0)

## 2023-04-20 LAB — CBC WITH DIFFERENTIAL/PLATELET
Basophils Absolute: 0.1 10*3/uL (ref 0.0–0.1)
Basophils Relative: 1.1 % (ref 0.0–3.0)
Eosinophils Absolute: 0.2 10*3/uL (ref 0.0–0.7)
Eosinophils Relative: 3.5 % (ref 0.0–5.0)
HCT: 40.5 % (ref 36.0–46.0)
Hemoglobin: 12.9 g/dL (ref 12.0–15.0)
Lymphocytes Relative: 25.8 % (ref 12.0–46.0)
Lymphs Abs: 1.4 10*3/uL (ref 0.7–4.0)
MCHC: 31.8 g/dL (ref 30.0–36.0)
MCV: 86.7 fL (ref 78.0–100.0)
Monocytes Absolute: 0.5 10*3/uL (ref 0.1–1.0)
Monocytes Relative: 9.4 % (ref 3.0–12.0)
Neutro Abs: 3.3 10*3/uL (ref 1.4–7.7)
Neutrophils Relative %: 60.2 % (ref 43.0–77.0)
Platelets: 337 10*3/uL (ref 150.0–400.0)
RBC: 4.67 Mil/uL (ref 3.87–5.11)
RDW: 13.8 % (ref 11.5–15.5)
WBC: 5.5 10*3/uL (ref 4.0–10.5)

## 2023-04-20 LAB — COMPREHENSIVE METABOLIC PANEL
ALT: 15 U/L (ref 0–35)
AST: 20 U/L (ref 0–37)
Albumin: 4.1 g/dL (ref 3.5–5.2)
Alkaline Phosphatase: 83 U/L (ref 39–117)
BUN: 16 mg/dL (ref 6–23)
CO2: 27 meq/L (ref 19–32)
Calcium: 11.2 mg/dL — ABNORMAL HIGH (ref 8.4–10.5)
Chloride: 106 meq/L (ref 96–112)
Creatinine, Ser: 0.75 mg/dL (ref 0.40–1.20)
GFR: 80.7 mL/min (ref >60.00)
Glucose, Bld: 91 mg/dL (ref 70–99)
Potassium: 4.3 meq/L (ref 3.5–5.1)
Sodium: 141 meq/L (ref 135–145)
Total Bilirubin: 0.4 mg/dL (ref 0.2–1.2)
Total Protein: 6.9 g/dL (ref 6.0–8.3)

## 2023-04-20 LAB — HEMOGLOBIN A1C: Hgb A1c MFr Bld: 5.7 % (ref 4.6–6.5)

## 2023-04-20 LAB — TSH: TSH: 0.88 u[IU]/mL (ref 0.35–5.50)

## 2023-04-20 NOTE — Progress Notes (Signed)
Subjective:   Angie Garcia is a 72 y.o. female who presents for Medicare Annual (Subsequent) preventive examination.  Visit Complete: Virtual I connected with  Angie Garcia on 04/20/23 by a audio enabled telemedicine application and verified that I am speaking with the correct person using two identifiers.  Patient Location: Home  Provider Location: Office/Clinic  I discussed the limitations of evaluation and management by telemedicine. The patient expressed understanding and agreed to proceed.  Vital Signs: Because this visit was a virtual/telehealth visit, some criteria may be missing or patient reported. Any vitals not documented were not able to be obtained and vitals that have been documented are patient reported.  Patient Medicare AWV questionnaire was completed by the patient on 04/16/2023; I have confirmed that all information answered by patient is correct and no changes since this date.  Cardiac Risk Factors include: advanced age (>41men, >54 women);hypertension;dyslipidemia;Other (see comment), Risk factor comments: Malignant neoplasm-Lt breast     Objective:    Today's Vitals   04/20/23 0946  Weight: 166 lb (75.3 kg)  Height: 5\' 5"  (1.651 m)   Body mass index is 27.62 kg/m.     04/20/2023    9:51 AM 02/22/2023    8:54 AM 11/22/2022    1:17 PM 10/06/2022   11:23 AM 04/05/2022    8:49 AM 04/04/2021    8:46 AM 04/01/2020    8:31 AM  Advanced Directives  Does Patient Have a Medical Advance Directive? Yes Yes No No Yes Yes No  Type of Estate agent of [place];Living will Living will   Healthcare Power of [place];Living will Living will;Healthcare Power of Attorney   Does patient want to make changes to medical advance directive?    Yes (MAU/Ambulatory/Procedural Areas - Information given)  No - Patient declined   Copy of Healthcare Power of Attorney in Chart? No - copy requested    No - copy requested No - copy requested   Would patient like  information on creating a medical advance directive?   No - Patient declined    No - Patient declined    Current Medications (verified) Outpatient Encounter Medications as of 04/20/2023  Medication Sig   anastrozole (ARIMIDEX) 1 MG tablet Take 1 tablet (1 mg total) by mouth daily.   cetirizine (ZYRTEC) 10 MG tablet Take 10 mg by mouth daily as needed for allergies.   diltiazem (CARDIZEM CD) 120 MG 24 hr capsule TAKE ONE CAPSULE BY MOUTH ONE TIME DAILY   lisinopril (ZESTRIL) 20 MG tablet TAKE ONE TABLET BY MOUTH ONE TIME DAILY   TURMERIC PO Take 1 capsule by mouth daily.   No facility-administered encounter medications on file as of 04/20/2023.    Allergies (verified) Sulfamethoxazole-trimethoprim   History: Past Medical History:  Diagnosis Date   Allergy    Arthritis    Baker's cyst    Left knee   Breast cancer (HCC)    History of radiation therapy    Left breast- 12/05/22-01/22/23-Dr. Antony Blackbird   Hx of adenomatous polyp of colon 05/09/2017   Hypertension    Lactose intolerance    Shingles 10/2008   RUE   Past Surgical History:  Procedure Laterality Date   ABDOMINAL HYSTERECTOMY     Dr Roberto Scales for fibroids   BREAST BIOPSY Left 09/18/2022   [place] LT BREAST BX W LOC DEV 1ST LESION IMG BX SPEC US GUIDE 09/18/2022 [facility]   BREAST BIOPSY  10/10/2022   MM LT RADIOACTIVE SEED LOC MAMMO  GUIDE 10/10/2022 [facility]   BREAST BIOPSY Left 10/10/2022   [place] LT RADIOACTIVE SEED LOC 10/10/2022 [facility]   BREAST LUMPECTOMY WITH RADIOACTIVE SEED AND SENTINEL LYMPH NODE BIOPSY Left 10/10/2022   Procedure: LEFT BREAST LUMPECTOMY WITH RADIOACTIVE SEED AND AXILLARY SENTINEL LYMPH NODE BIOPSY;  Surgeon: Emelia Loron, MD;  Location: [facility];  Service: General;  Laterality: Left;   COLONOSCOPY  12/11/2005   lymphoid aggregate removed (suspected polyp was not), melanosis coli   PILONIDAL CYSTECTOMY     spine cyst   RADIOACTIVE SEED GUIDED AXILLARY SENTINEL LYMPH NODE  Left 10/10/2022   Procedure: RADIOACTIVE SEED GUIDED LEFT AXILLARY SENTINEL LYMPH NODE EXCISION;  Surgeon: Emelia Loron, MD;  Location: [facility] OR;  Service: General;  Laterality: Left;   Family History  Problem Relation Age of Onset   Hypertension Mother    Breast cancer Mother 75   Diabetes Father    Stroke Father        MINI STROKES   Stroke Sister 3   Diabetes Sister    Cancer Maternal Uncle        UNKNOWN TYPE   Colon cancer Neg Hx    Esophageal cancer Neg Hx    Pancreatic cancer Neg Hx    Rectal cancer Neg Hx    Stomach cancer Neg Hx    Hyperparathyroidism Neg Hx    Social History   Socioeconomic History   Marital status: Married    Spouse name: [name]   Number of children: 1   Years of education: Not on file   Highest education level: Not on file  Occupational History   Occupation: retired    Associate Professor: [employer]  Tobacco Use   Smoking status: Former    Current packs/day: 0.00    Types: Cigarettes    Quit date: 06/27/1995    Years since quitting: 27.8   Smokeless tobacco: Never   Tobacco comments:    smoked [age], up to < 1 ppd  Vaping Use   Vaping status: Never Used  Substance and Sexual Activity   Alcohol use: No   Drug use: No   Sexual activity: Yes  Other Topics Concern   Not on file  Social History Narrative   REGULAR EXERCISE   Lives with husband. Has a dog   Social Determinants of Health   Financial Resource Strain: Low Risk  (04/16/2023)   Overall Financial Resource Strain (CARDIA)    Difficulty of Paying Living Expenses: Not hard at all  Food Insecurity: No Food Insecurity (04/16/2023)   Hunger Vital Sign    Worried About Running Out of Food in the Last Year: Never true    Ran Out of Food in the Last Year: Never true  Transportation Needs: No Transportation Needs (04/16/2023)   PRAPARE - Administrator, Civil Service (Medical): No    Lack of Transportation (Non-Medical): No  Physical Activity: Sufficiently Active  (04/16/2023)   Exercise Vital Sign    Days of Exercise per Week: 7 days    Minutes of Exercise per Session: 30 min  Stress: No Stress Concern Present (04/05/2022)   [employer] of Occupational Health - Occupational Stress Questionnaire    Feeling of Stress : Not at all  Social Connections: Unknown (04/16/2023)   Social Connection and Isolation Panel [NHANES]    Frequency of Communication with Friends and Family: Once a week    Frequency of Social Gatherings with Friends and Family: More than three times a week  Attends Religious Services: Not on file    Active Member of Clubs or Organizations: Yes    Attends Club or Organization Meetings: 1 to 4 times per year    Marital Status: Married    Tobacco Counseling Counseling given: Not Answered Tobacco comments: smoked 16-19, up to < 1 ppd   Clinical Intake:  Pre-visit preparation completed: Yes  Pain : No/denies pain     BMI - recorded: 27.62 Nutritional Status: BMI 25 -29 Overweight Nutritional Risks: None Diabetes: No  How often do you need to have someone help you when you read instructions, pamphlets, or other written materials from your doctor or pharmacy?: 1 - Never  Interpreter Needed?: No  Information entered by :: Jermone Geister, RMA   Activities of Daily Living    04/16/2023    1:48 PM 10/06/2022   11:27 AM  In your present state of health, do you have any difficulty performing the following activities:  Hearing? 0   Vision? 0   Difficulty concentrating or making decisions? 0   Walking or climbing stairs? 0   Dressing or bathing? 0   Doing errands, shopping? 0 0  Preparing Food and eating ? N   Using the Toilet? N   In the past six months, have you accidently leaked urine? N   Do you have problems with loss of bowel control? N   Managing your Medications? N   Managing your Finances? N   Housekeeping or managing your Housekeeping? N     Patient Care Team: Pincus Sanes, MD as PCP - General  (Internal Medicine) Romero Belling, MD (Inactive) as Consulting Physician (Endocrinology) de Freida Busman, [place], OD as Consulting Physician (Optometry) Lenice Llamas, Garry Heater, [facility] (Inactive) as Pharmacist (Pharmacist) Rachel Moulds, MD as Consulting Physician (Hematology and Oncology) Emelia Loron, MD as Consulting Physician (General Surgery) Antony Blackbird, MD as Consulting Physician (Radiation Oncology) Pershing Proud, RN as Oncology Nurse Navigator Donnelly Angelica, RN as Oncology Nurse Navigator  Indicate any recent Medical Services you may have received from other than [facility] providers in the past year (date may be approximate).     Assessment:   This is a routine wellness examination for [place].  Hearing/Vision screen Hearing Screening - Comments:: Denies hearing difficulties   Vision Screening - Comments:: Wears eyeglasses   Goals Addressed             This Visit's Progress    My goal is to eat healthy and increase my water intake.   On track     Depression Screen    04/20/2023    9:56 AM 11/22/2022    1:24 PM 04/05/2022    8:41 AM 04/04/2021    8:48 AM 04/01/2020    8:30 AM 03/25/2019    8:13 AM 02/14/2019   11:35 AM  PHQ 2/9 Scores  PHQ - 2 Score 0 0 0 0 0 0 0  PHQ- 9 Score 0          Fall Risk    04/16/2023    1:48 PM 04/05/2022    8:42 AM 04/04/2021    8:47 AM 04/01/2020    8:31 AM 03/25/2019    8:13 AM  Fall Risk   Falls in the past year? 0 0 0 0 0  Number falls in past yr: 0 0 0 0 0  Injury with Fall? 0 0 0 0   Risk for fall due to :  No Fall Risks No Fall Risks No Fall Risks  Follow up Falls evaluation completed;Follow up appointment Falls prevention discussed Falls evaluation completed Falls evaluation completed     MEDICARE RISK AT HOME: Medicare Risk at Home If so, are there any without handrails?: No Home free of loose throw rugs in walkways, pet beds, electrical cords, etc?: No Adequate lighting in your home to reduce risk of falls?:  Yes Life alert?: No Use of a cane, walker or w/c?: Yes Grab bars in the bathroom?: Yes Shower chair or bench in shower?: Yes Elevated toilet seat or a handicapped toilet?: Yes  TIMED UP AND GO:  Was the test performed?  No    Cognitive Function:        04/20/2023    9:55 AM 04/20/2023    9:52 AM 04/05/2022    8:43 AM  6CIT Screen  What Year? 0 points 0 points 0 points  What month? 0 points 0 points 0 points  What time?  0 points 0 points  Count back from 20  0 points 0 points  Months in reverse  0 points 0 points  Repeat phrase  0 points 0 points  Total Score  0 points 0 points    Immunizations Immunization History  Administered Date(s) Administered   Fluad Quad(high Dose 65+) 05/17/2019, 04/08/2020, 03/06/2022   Hepatitis B 08/03/1987, 12/27/1987   Influenza, High Dose Seasonal PF 04/01/2021   Influenza,inj,Quad PF,6-35 Mos 04/02/2018   Influenza-Unspecified 03/22/2015, 03/12/2017, 03/10/2022   Moderna Sars-Covid-2 Vaccination 04/30/2020   PFIZER(Purple Top)SARS-COV-2 Vaccination 08/17/2019, 09/10/2019   Td 06/26/2001   Tdap 02/11/2015   Zoster, Live 04/11/2013    TDAP status: Up to date  Flu Vaccine status: Up to date  Pneumococcal vaccine status: Declined,  Education has been provided regarding the importance of this vaccine but patient still declined. Advised may receive this vaccine at local pharmacy or Health Dept. Aware to provide a copy of the vaccination record if obtained from local pharmacy or Health Dept. Verbalized acceptance and understanding.   Covid-19 vaccine status: Completed vaccines  Qualifies for Shingles Vaccine? Yes   Zostavax completed Yes   Shingrix Completed?: No.    Education has been provided regarding the importance of this vaccine. Patient has been advised to call insurance company to determine out of pocket expense if they have not yet received this vaccine. Advised may also receive vaccine at local pharmacy or Health Dept.  Verbalized acceptance and understanding.  Screening Tests Health Maintenance  Topic Date Due   Pneumonia Vaccine 98+ Years old (1 of 1 - PCV) Never done   DEXA SCAN  03/18/2021   Zoster Vaccines- Shingrix (1 of 2) 07/21/2023 (Originally 12/13/1969)   INFLUENZA VACCINE  09/24/2023 (Originally 01/25/2023)   COVID-19 Vaccine (4 - 2023-24 season) 09/24/2023 (Originally 02/25/2023)   Medicare Annual Wellness (AWV)  04/19/2024   Colonoscopy  05/03/2024   MAMMOGRAM  08/28/2024   DTaP/Tdap/Td (3 - Td or Tdap) 02/10/2025   Hepatitis C Screening  Completed   HPV VACCINES  Aged Out    Health Maintenance  Health Maintenance Due  Topic Date Due   Pneumonia Vaccine 58+ Years old (1 of 1 - PCV) Never done   DEXA SCAN  03/18/2021    Colorectal cancer screening: Type of screening: Colonoscopy. Completed 05/03/2017. Repeat every 7 years  Mammogram status: Completed 08/29/2022. Repeat every year  Bone Density status: Ordered 04/20/2023. Pt provided with contact info and advised to call to schedule appt.  Lung Cancer Screening: (Low Dose CT Chest recommended if Age 65-80 years,  20 pack-year currently smoking OR have quit w/in 15years.) does not qualify.   Lung Cancer Screening Referral: N/A  Additional Screening:  Hepatitis C Screening: does qualify; Completed 08/16/2015  Vision Screening: Recommended annual ophthalmology exams for early detection of glaucoma and other disorders of the eye. Is the patient up to date with their annual eye exam?  No  Who is the provider or what is the name of the office in which the patient attends annual eye exams? Dr. Alva Garnet If pt is not established with a provider, would they like to be referred to a provider to establish care? No .   Dental Screening: Recommended annual dental exams for proper oral hygiene   Community Resource Referral / Chronic Care Management: CRR required this visit?  No   CCM required this visit?  No     Plan:     I have  personally reviewed and noted the following in the patient's chart:   Medical and social history Use of alcohol, tobacco or illicit drugs  Current medications and supplements including opioid prescriptions. Patient is not currently taking opioid prescriptions. Functional ability and status Nutritional status Physical activity Advanced directives List of other physicians Hospitalizations, surgeries, and ER visits in previous 12 months Vitals Screenings to include cognitive, depression, and falls Referrals and appointments  In addition, I have reviewed and discussed with patient certain preventive protocols, quality metrics, and best practice recommendations. A written personalized care plan for preventive services as well as general preventive health recommendations were provided to patient.     Eastin Swing L Derrisha Foos, CMA   04/20/2023   After Visit Summary: (MyChart) Due to this being a telephonic visit, the after visit summary with patients personalized plan was offered to patient via MyChart   Nurse Notes: Patient declines Pneumonia, Shingrix and vaccine.  She stated that she received a Flu and Covid vaccine on 04/06/23 at [facility] pharmacy, however it is not documented in [place].  Patient is due for a DEXA and it has been reordered today.  Patient is aware to call and schedule appointment.  She had no other concerns to address today.

## 2023-04-20 NOTE — Assessment & Plan Note (Signed)
Chronic Blood pressure well controlled CMP, cbc Continue diltiazem 120 mg daily, lisinopril 20 mg daily

## 2023-04-20 NOTE — Assessment & Plan Note (Signed)
Chronic Regular exercise and healthy diet encouraged Check lipid panel  Currently lifestyle controlled Discussed starting a statin given elevated ASCVD risk.  She is concerned about her cholesterol, but would prefer to be not on medication Discussed getting a CT coronary calcium score test to evaluate her risk to help [place] determine how much she needs the statin-she will think about this advise she can let me know and I can order this

## 2023-04-20 NOTE — Assessment & Plan Note (Signed)
Chronic Check a1c Low sugar / carb diet Stressed regular exercise  

## 2023-04-20 NOTE — Patient Instructions (Signed)
Angie Garcia , Thank you for taking time to come for your Medicare Wellness Visit. I appreciate your ongoing commitment to your health goals. Please review the following plan we discussed and let me know if I can assist you in the future.   Referrals/Orders/Follow-Ups/Clinician Recommendations: You are due for a Bone Density, please call to schedule appointment.  It was talking to you today.  Keep up the good work. You have an order for:   [x]   Bone Density     Please call for appointment:   [facility] [address] [place], [place] [address] [phone]      Make sure to wear two-piece clothing.  No lotions, powders, or deodorants the day of the appointment. Make sure to bring picture ID and insurance card.  Bring list of medications you are currently taking including any supplements.   Schedule your [facility] screening mammogram through MyChart!   Log into your MyChart account.  Go to 'Visit' (or 'Appointments' if on mobile App) --> Schedule an Appointment  Under 'Select a Reason for Visit' choose the Mammogram Screening option.  Complete the pre-visit questions and select the time and place that best fits your schedule.    This is a list of the screening recommended for you and due dates:  Health Maintenance  Topic Date Due   Pneumonia Vaccine (1 of 1 - PCV) Never done   DEXA scan (bone density measurement)  03/18/2021   Zoster (Shingles) Vaccine (1 of 2) 07/21/2023*   Flu Shot  09/24/2023*   COVID-19 Vaccine (4 - 2023-24 season) 09/24/2023*   Medicare Annual Wellness Visit  04/19/2024   Colon Cancer Screening  05/03/2024   Mammogram  08/28/2024   DTaP/Tdap/Td vaccine (3 - Td or Tdap) 02/10/2025   Hepatitis C Screening  Completed   HPV Vaccine  Aged Out  *Topic was postponed. The date shown is not the original due date.    Advanced directives: (Copy Requested) Please bring a copy of your health care power of attorney and living will to  the office to be added to your chart at your convenience.  Next Medicare Annual Wellness Visit scheduled for next year: Yes

## 2023-04-20 NOTE — Assessment & Plan Note (Signed)
Chronic No concerning nodules felt on exam TSH

## 2023-04-20 NOTE — Assessment & Plan Note (Signed)
Chronic DEXA deferred-she would like to consider this in the spring Stressed regular exercise Advised calcium 600 mg daily and vitamin D 1000-2000 mg twice daily-advise she can take calcium and vitamin D pill or take a multivitamin Encouraged high calcium diet

## 2023-04-24 ENCOUNTER — Encounter: Payer: Medicare HMO | Admitting: Adult Health

## 2023-04-25 ENCOUNTER — Encounter: Payer: Medicare HMO | Admitting: Adult Health

## 2023-05-02 ENCOUNTER — Encounter: Payer: Medicare HMO | Admitting: Adult Health

## 2023-05-02 ENCOUNTER — Ambulatory Visit: Payer: Medicare HMO | Admitting: Rehabilitation

## 2023-05-04 ENCOUNTER — Encounter: Payer: Medicare HMO | Admitting: Adult Health

## 2023-05-04 ENCOUNTER — Ambulatory Visit: Payer: Medicare HMO | Attending: Hematology and Oncology

## 2023-05-04 VITALS — Wt 165.2 lb

## 2023-05-04 DIAGNOSIS — Z483 Aftercare following surgery for neoplasm: Secondary | ICD-10-CM | POA: Insufficient documentation

## 2023-05-04 NOTE — Therapy (Signed)
OUTPATIENT PHYSICAL THERAPY SOZO SCREENING NOTE   Patient Name: Angie Garcia MRN: [id number] DOB:Feb 10, 1951, 72 y.o., female Today's Date: 05/04/2023  PCP: Pincus Sanes, MD REFERRING PROVIDER: Rachel Moulds, MD   PT End of Session - 05/04/23 0856     Visit Number 2   # unchanged due to screen only   PT Start Time 0855    PT Stop Time 0859    PT Time Calculation (min) 4 min    Activity Tolerance Patient tolerated treatment well    Behavior During Therapy [facility] for tasks assessed/performed             Past Medical History:  Diagnosis Date   Allergy    Arthritis    Baker's cyst    Left knee   Breast cancer (HCC)    History of radiation therapy    Left breast- 12/05/22-01/22/23-Dr. Antony Blackbird   Hx of adenomatous polyp of colon 05/09/2017   Hypertension    Lactose intolerance    Shingles 10/2008   RUE   Past Surgical History:  Procedure Laterality Date   ABDOMINAL HYSTERECTOMY     Dr Roberto Scales for fibroids   BREAST BIOPSY Left 09/18/2022   [place] LT BREAST BX W LOC DEV 1ST LESION IMG BX SPEC US GUIDE 09/18/2022 [facility]   BREAST BIOPSY  10/10/2022   MM LT RADIOACTIVE SEED LOC MAMMO GUIDE 10/10/2022 [facility]   BREAST BIOPSY Left 10/10/2022   [place] LT RADIOACTIVE SEED LOC 10/10/2022 [facility]   BREAST LUMPECTOMY WITH RADIOACTIVE SEED AND SENTINEL LYMPH NODE BIOPSY Left 10/10/2022   Procedure: LEFT BREAST LUMPECTOMY WITH RADIOACTIVE SEED AND AXILLARY SENTINEL LYMPH NODE BIOPSY;  Surgeon: Emelia Loron, MD;  Location: [facility] OR;  Service: General;  Laterality: Left;   COLONOSCOPY  12/11/2005   lymphoid aggregate removed (suspected polyp was not), melanosis coli   PILONIDAL CYSTECTOMY     spine cyst   RADIOACTIVE SEED GUIDED AXILLARY SENTINEL LYMPH NODE Left 10/10/2022   Procedure: RADIOACTIVE SEED GUIDED LEFT AXILLARY SENTINEL LYMPH NODE EXCISION;  Surgeon: Emelia Loron, MD;  Location: [facility] OR;  Service: General;  Laterality: Left;   Patient  Active Problem List   Diagnosis Date Noted   Malignant neoplasm of upper-inner quadrant of left breast in female, estrogen receptor positive (HCC) 09/25/2022   Osteopenia 04/10/2021   Hyperparathyroidism, primary (HCC) 03/14/2018   Multinodular goiter 03/14/2018   Hx of adenomatous polyp of colon 05/09/2017   Cyst of left kidney 10/21/2015   Prediabetes 08/19/2015   Hyperlipidemia 06/02/2014   DJD (degenerative joint disease) of knee 04/01/2013   Hemorrhoids, internal, with bleeding 07/04/2011   LACTOSE INTOLERANCE 11/18/2009   Allergic rhinitis 11/18/2009   History of herpes zoster 10/28/2008   Essential hypertension 01/24/2008    REFERRING DIAG: left breast cancer at risk for lymphedema  THERAPY DIAG:  Aftercare following surgery for neoplasm  PERTINENT HISTORY: Lt lumpectomy due to [facility] ER/PR positive breast cancer on 10/10/22. 1/6 positive nodes removed. Oncotype 17 and will not be getting chemotherapy  PRECAUTIONS: left UE Lymphedema risk, None  SUBJECTIVE: Pt returns for her 3 month L-Dex screen.   PAIN:  Are you having pain? No  SOZO SCREENING: Patient was assessed today using the SOZO machine to determine the lymphedema index score. This was compared to her baseline score. It was determined that she is within the recommended range when compared to her baseline and no further action is needed at this time. She will continue SOZO screenings. These are  done every 3 months for 2 years post operatively followed by every 6 months for 2 years, and then annually. Answered pts questions about SOZO.    L-DEX FLOWSHEETS - 05/04/23 0800       L-DEX LYMPHEDEMA SCREENING   Measurement Type Unilateral    L-DEX MEASUREMENT EXTREMITY Upper Extremity    POSITION  Standing    DOMINANT SIDE Right    At Risk Side Left    BASELINE SCORE (UNILATERAL) 0.9    L-DEX SCORE (UNILATERAL) 3.9    VALUE CHANGE (UNILAT) 3               Hermenia Bers, PTA 05/04/2023, 9:00  AM

## 2023-05-11 ENCOUNTER — Inpatient Hospital Stay: Payer: Medicare HMO | Attending: Adult Health | Admitting: Adult Health

## 2023-05-11 ENCOUNTER — Encounter: Payer: Self-pay | Admitting: Adult Health

## 2023-05-11 VITALS — BP 136/74 | HR 84 | Temp 97.7°F | Resp 16 | Wt 167.6 lb

## 2023-05-11 DIAGNOSIS — Z170 Estrogen receptor positive status [ER+]: Secondary | ICD-10-CM | POA: Insufficient documentation

## 2023-05-11 DIAGNOSIS — C50212 Malignant neoplasm of upper-inner quadrant of left female breast: Secondary | ICD-10-CM | POA: Diagnosis not present

## 2023-05-11 DIAGNOSIS — Z79899 Other long term (current) drug therapy: Secondary | ICD-10-CM | POA: Diagnosis not present

## 2023-05-11 DIAGNOSIS — M8580 Other specified disorders of bone density and structure, unspecified site: Secondary | ICD-10-CM | POA: Insufficient documentation

## 2023-05-11 NOTE — Progress Notes (Unsigned)
SURVIVORSHIP VISIT:  BRIEF ONCOLOGIC HISTORY:  Oncology History  Malignant neoplasm of upper-inner quadrant of left breast in female, estrogen receptor positive (HCC)  08/29/2022 Mammogram   Bilateral screening mammogram showed possible mass in the left breast warranting further evaluation.  No suspicious findings for malignancy in the right breast.  Diagnostic mammogram confirmed a suspicious mass in the left breast at 9:00 measuring 1.8 cm, suspicious left axillary lymph node.  Ultrasound confirmed the above-mentioned findings.   09/18/2022 Pathology Results   Pathology from the left breast needle core biopsy at 9:00 2 cm from nipple showed overall grade 2 invasive ductal carcinoma, prognostic showed ER 100% positive strong staining PR 75% positive moderate to strong staining Ki-67 of 2% and HER2 negative.  On the lymph node biopsy there is a comment with states that the nuclear features are similar but there is obvious ductular differentiation which is different compared to the primary specimen hence prognostics were also repeated on the lymph node which once again confirmed ER 100% positive strong staining PR 30% positive moderate to strong staining Ki-67 of 5% and group 5 HER2 negative   09/25/2022 Initial Diagnosis   Malignant neoplasm of upper-inner quadrant of left breast in female, estrogen receptor positive   12/05/2022 - 01/22/2023 Radiation Therapy   Site/dose:   1) Left breast - 50.4 Gy delivered in 28 Fx at 1.8 Gy/Fx 2) Left breast_SCV - 50.4 Gy delivered in 28 Fx at 1.8 Gy/Fx 3) Left breast boost - 12 Gy delivered in 6 Fx at 2 Gy/Fx   01/18/2023 Cancer Staging   Staging form: Breast, AJCC 8th Edition - Pathologic stage from 01/18/2023: Stage IB (pT1c, pN1, cM0, G3, ER+, PR+, HER2-) - Signed by Rachel Moulds, MD on 01/18/2023 Stage prefix: Initial diagnosis Histologic grading system: 3 grade system   01/2023 -  Anti-estrogen oral therapy   Anastrozole x 5 years     INTERVAL  HISTORY:  Ms. Seedorf to review her survivorship care plan detailing her treatment course for breast cancer, as well as monitoring long-term side effects of that treatment, education regarding health maintenance, screening, and overall wellness and health promotion.     Overall, Ms. Stmarie reports feeling quite well. She reports experiencing joint aches and pains, which she attributes to her ongoing anastrozole therapy. Despite these discomforts, she has been diligent in maintaining an active lifestyle, including regular exercise.  The patient has been adhering to a diet that includes soy, walnuts, and bananas, in an effort to manage her high cholesterol levels naturally. She expresses a desire to avoid taking additional medications if possible. She has also been exploring a more plant-based diet, with a focus on whole foods.  The patient has a history of dense breasts, which has led to frequent callbacks following mammograms. She expresses some anxiety about the constant need for follow-up imaging and has considered a mastectomy to alleviate this stress. However, she has decided to continue with her current treatment plan, which includes anastrozole therapy and regular imaging.  The patient has a history of tobacco use but quit over fifteen years ago. She also reports a family history of cancer, which has led to some concern about her own risk of recurrence. Despite these concerns, the patient remains socially active and maintains a positive outlook, attributing her resilience to her faith and the support of her family.  REVIEW OF SYSTEMS:  Review of Systems  Constitutional:  Negative for appetite change, chills, fatigue, fever and unexpected weight change.  HENT:  Negative for hearing loss, lump/mass and trouble swallowing.   Eyes:  Negative for eye problems and icterus.  Respiratory:  Negative for chest tightness, cough and shortness of breath.   Cardiovascular:  Negative for chest pain, leg swelling  and palpitations.  Gastrointestinal:  Negative for abdominal distention, abdominal pain, constipation, diarrhea, nausea and vomiting.  Endocrine: Negative for hot flashes.  Genitourinary:  Negative for difficulty urinating.   Musculoskeletal:  Negative for arthralgias.  Skin:  Negative for itching and rash.  Neurological:  Negative for dizziness, extremity weakness, headaches and numbness.  Hematological:  Negative for adenopathy. Does not bruise/bleed easily.  Psychiatric/Behavioral:  Negative for depression. The patient is not nervous/anxious.    Breast: Denies any new nodularity, masses, tenderness, nipple changes, or nipple discharge.       PAST MEDICAL/SURGICAL HISTORY:  Past Medical History:  Diagnosis Date   Allergy    Arthritis    Baker's cyst    Left knee   Breast cancer (HCC)    History of radiation therapy    Left breast- 12/05/22-01/22/23-Dr. Antony Blackbird   Hx of adenomatous polyp of colon 05/09/2017   Hypertension    Lactose intolerance    Shingles 10/2008   RUE   Past Surgical History:  Procedure Laterality Date   ABDOMINAL HYSTERECTOMY     Dr Roberto Scales for fibroids   BREAST BIOPSY Left 09/18/2022   [place] LT BREAST BX W LOC DEV 1ST LESION IMG BX SPEC US GUIDE 09/18/2022 [facility]   BREAST BIOPSY  10/10/2022   MM LT RADIOACTIVE SEED LOC MAMMO GUIDE 10/10/2022 [facility]   BREAST BIOPSY Left 10/10/2022   [place] LT RADIOACTIVE SEED LOC 10/10/2022 [facility]   BREAST LUMPECTOMY WITH RADIOACTIVE SEED AND SENTINEL LYMPH NODE BIOPSY Left 10/10/2022   Procedure: LEFT BREAST LUMPECTOMY WITH RADIOACTIVE SEED AND AXILLARY SENTINEL LYMPH NODE BIOPSY;  Surgeon: Emelia Loron, MD;  Location: [facility] OR;  Service: General;  Laterality: Left;   COLONOSCOPY  12/11/2005   lymphoid aggregate removed (suspected polyp was not), melanosis coli   PILONIDAL CYSTECTOMY     spine cyst   RADIOACTIVE SEED GUIDED AXILLARY SENTINEL LYMPH NODE Left 10/10/2022   Procedure:  RADIOACTIVE SEED GUIDED LEFT AXILLARY SENTINEL LYMPH NODE EXCISION;  Surgeon: Emelia Loron, MD;  Location: [facility] OR;  Service: General;  Laterality: Left;     ALLERGIES:  Allergies  Allergen Reactions   Sulfamethoxazole-Trimethoprim Rash    Rash Because of a history of documented adverse serious drug reaction;Medi Alert bracelet  is recommended     CURRENT MEDICATIONS:  Outpatient Encounter Medications as of 05/11/2023  Medication Sig   anastrozole (ARIMIDEX) 1 MG tablet Take 1 tablet (1 mg total) by mouth daily.   cetirizine (ZYRTEC) 10 MG tablet Take 10 mg by mouth daily as needed for allergies.   diltiazem (CARDIZEM CD) 120 MG 24 hr capsule TAKE ONE CAPSULE BY MOUTH ONE TIME DAILY   lisinopril (ZESTRIL) 20 MG tablet TAKE ONE TABLET BY MOUTH ONE TIME DAILY   TURMERIC PO Take 1 capsule by mouth daily.   No facility-administered encounter medications on file as of 05/11/2023.     ONCOLOGIC FAMILY HISTORY:  Family History  Problem Relation Age of Onset   Hypertension Mother    Breast cancer Mother 28   Diabetes Father    Stroke Father        MINI STROKES   Stroke Sister 17   Diabetes Sister    Cancer Maternal Uncle  UNKNOWN TYPE   Colon cancer Neg Hx    Esophageal cancer Neg Hx    Pancreatic cancer Neg Hx    Rectal cancer Neg Hx    Stomach cancer Neg Hx    Hyperparathyroidism Neg Hx      SOCIAL HISTORY:  Social History   Socioeconomic History   Marital status: Married    Spouse name: [name]   Number of children: 1   Years of education: Not on file   Highest education level: Not on file  Occupational History   Occupation: retired    Associate Professor: [employer]  Tobacco Use   Smoking status: Former    Current packs/day: 0.00    Types: Cigarettes    Quit date: 06/27/1995    Years since quitting: 27.8   Smokeless tobacco: Never   Tobacco comments:    smoked [age], up to < 1 ppd  Vaping Use   Vaping status: Never Used  Substance and Sexual Activity    Alcohol use: No   Drug use: No   Sexual activity: Yes  Other Topics Concern   Not on file  Social History Narrative   REGULAR EXERCISE   Lives with husband. Has a dog   Social Determinants of Health   Financial Resource Strain: Low Risk  (04/16/2023)   Overall Financial Resource Strain (CARDIA)    Difficulty of Paying Living Expenses: Not hard at all  Food Insecurity: No Food Insecurity (04/16/2023)   Hunger Vital Sign    Worried About Running Out of Food in the Last Year: Never true    Ran Out of Food in the Last Year: Never true  Transportation Needs: No Transportation Needs (04/16/2023)   PRAPARE - Administrator, Civil Service (Medical): No    Lack of Transportation (Non-Medical): No  Physical Activity: Sufficiently Active (04/16/2023)   Exercise Vital Sign    Days of Exercise per Week: 7 days    Minutes of Exercise per Session: 30 min  Stress: No Stress Concern Present (04/05/2022)   [employer] of Occupational Health - Occupational Stress Questionnaire    Feeling of Stress : Not at all  Social Connections: Unknown (04/16/2023)   Social Connection and Isolation Panel [NHANES]    Frequency of Communication with Friends and Family: Once a week    Frequency of Social Gatherings with Friends and Family: More than three times a week    Attends Religious Services: Not on file    Active Member of Clubs or Organizations: Yes    Attends Banker Meetings: 1 to 4 times per year    Marital Status: Married  Catering manager Violence: Not At Risk (04/20/2023)   Humiliation, Afraid, Rape, and Kick questionnaire    Fear of Current or Ex-Partner: No    Emotionally Abused: No    Physically Abused: No    Sexually Abused: No     OBSERVATIONS/OBJECTIVE:  BP 136/74 (BP Location: Left Arm, Patient Position: Sitting)   Pulse 84   Temp 97.7 F (36.5 C) (Temporal)   Resp 16   Wt 167 lb 9.6 oz (76 kg)   SpO2 100%   BMI 27.89 kg/m  GENERAL: Patient is  a well appearing female in no acute distress HEENT:  Sclerae anicteric.  Oropharynx clear and moist. No ulcerations or evidence of oropharyngeal candidiasis. Neck is supple.  NODES:  No cervical, supraclavicular, or axillary lymphadenopathy palpated.  BREAST EXAM: Left breast status postlumpectomy and radiation no sign of local recurrence, right  breast benign. LUNGS:  Clear to auscultation bilaterally.  No wheezes or rhonchi. HEART:  Regular rate and rhythm. No murmur appreciated. ABDOMEN:  Soft, nontender.  Positive, normoactive bowel sounds. No organomegaly palpated. MSK:  No focal spinal tenderness to palpation. Full range of motion bilaterally in the upper extremities. EXTREMITIES:  No peripheral edema.   SKIN:  Clear with no obvious rashes or skin changes. No nail dyscrasia. NEURO:  Nonfocal. Well oriented.  Appropriate affect.   LABORATORY DATA:  None for this visit.  DIAGNOSTIC IMAGING:  None for this visit.      ASSESSMENT AND PLAN:  Ms.. Krupicka is a pleasant 72 y.o. female with Stage IB left breast invasive ductal carcinoma, ER+/PR+/HER2-, diagnosed in 08/2022, treated with lumpectomy, adjuvant radiation therapy, and anti-estrogen therapy with Anastrozole beginning in 01/2023.  She presents to the Survivorship Clinic for our initial meeting and routine follow-up post-completion of treatment for breast cancer.    1. Stage IB left breast cancer:  Ms. Tsan is continuing to recover from definitive treatment for breast cancer. She will follow-up with her medical oncologist, Dr. Al Pimple in 6 months with history and physical exam per surveillance protocol.  She will continue her anti-estrogen therapy with Anastrozole. Thus far, she is tolerating the Anastrozole well, with minimal side effects. Her mammogram is due 09/2023; orders placed today.   Today, a comprehensive survivorship care plan and treatment summary was reviewed with the patient today detailing her breast cancer diagnosis, treatment  course, potential late/long-term effects of treatment, appropriate follow-up care with recommendations for the future, and patient education resources.  A copy of this summary, along with a letter will be sent to the patient's primary care provider via mail/fax/In Basket message after today's visit.    2. Bone health:  Given Ms. Schnackenberg's age/history of breast cancer and her current treatment regimen including anti-estrogen therapy with Anastrozole, she is at risk for bone demineralization.  Her last DEXA scan occurred in 2019 and was consistent with osteopenia.  Repeat has been ordered.  She was given education on specific activities to promote bone health.  3. Cancer screening:  Due to Ms. Verret's history and her age, she should receive screening for skin cancers, colon cancer.  The information and recommendations are listed on the patient's comprehensive care plan/treatment summary and were reviewed in detail with the patient.    4. Health maintenance and wellness promotion: Ms. Shippee was encouraged to consume 5-7 servings of fruits and vegetables per day. We reviewed the "Nutrition Rainbow" handout.  She was also encouraged to engage in moderate to vigorous exercise for 30 minutes per day most days of the week.  She was instructed to limit her alcohol consumption and continue to abstain from tobacco use.     5. Support services/counseling: It is not uncommon for this period of the patient's cancer care trajectory to be one of many emotions and stressors.   She was given information regarding our available services and encouraged to contact me with any questions or for help enrolling in any of our support group/programs.    Follow up instructions:    -Return to cancer center 6 months for f/u with Dr. Al Pimple  -Mammogram due in 09/2023 -She is welcome to return back to the Survivorship Clinic at any time; no additional follow-up needed at this time.  -Consider referral back to survivorship as a long-term  survivor for continued surveillance  The patient was provided an opportunity to ask questions and all were answered. The  patient agreed with the plan and demonstrated an understanding of the instructions.   Total encounter time:50 minutes*in face-to-face visit time, chart review, lab review, care coordination, order entry, and documentation of the encounter time.    Lillard Anes, NP 05/11/23 11:34 AM Medical Oncology and Hematology [facility] [address] [place], [place] [address] Tel. [phone]    Fax. [phone]  *Total Encounter Time as defined by the Centers for Medicare and Medicaid Services includes, in addition to the face-to-face time of a patient visit (documented in the note above) non-face-to-face time: obtaining and reviewing outside history, ordering and reviewing medications, tests or procedures, care coordination (communications with other health care professionals or caregivers) and documentation in the medical record.

## 2023-05-12 ENCOUNTER — Telehealth: Payer: Self-pay | Admitting: Hematology and Oncology

## 2023-05-12 NOTE — Telephone Encounter (Signed)
Spoke with patient confirming upcoming appointment  

## 2023-08-10 ENCOUNTER — Ambulatory Visit: Payer: Medicare HMO | Attending: Hematology and Oncology | Admitting: Rehabilitation

## 2023-08-10 DIAGNOSIS — C50212 Malignant neoplasm of upper-inner quadrant of left female breast: Secondary | ICD-10-CM | POA: Insufficient documentation

## 2023-08-10 DIAGNOSIS — Z170 Estrogen receptor positive status [ER+]: Secondary | ICD-10-CM | POA: Insufficient documentation

## 2023-08-10 DIAGNOSIS — Z483 Aftercare following surgery for neoplasm: Secondary | ICD-10-CM | POA: Insufficient documentation

## 2023-08-10 NOTE — Therapy (Signed)
OUTPATIENT PHYSICAL THERAPY SOZO SCREENING NOTE   Patient Name: Angie Garcia MRN: [id number] DOB:03/31/1951, 73 y.o., female Today's Date: 08/10/2023  PCP: Pincus Sanes, MD REFERRING PROVIDER: Rachel Moulds, MD   PT End of Session - 08/10/23 0943     Visit Number 2   screen   PT Start Time 0900    PT Stop Time 0905    PT Time Calculation (min) 5 min    Activity Tolerance Patient tolerated treatment well    Behavior During Therapy [facility] for tasks assessed/performed             Past Medical History:  Diagnosis Date   Allergy    Arthritis    Baker's cyst    Left knee   Breast cancer (HCC)    History of radiation therapy    Left breast- 12/05/22-01/22/23-Dr. Antony Blackbird   Hx of adenomatous polyp of colon 05/09/2017   Hypertension    Lactose intolerance    Shingles 10/2008   RUE   Past Surgical History:  Procedure Laterality Date   ABDOMINAL HYSTERECTOMY     Dr Roberto Scales for fibroids   BREAST BIOPSY Left 09/18/2022   [place] LT BREAST BX W LOC DEV 1ST LESION IMG BX SPEC US GUIDE 09/18/2022 [facility]   BREAST BIOPSY  10/10/2022   MM LT RADIOACTIVE SEED LOC MAMMO GUIDE 10/10/2022 [facility]   BREAST BIOPSY Left 10/10/2022   [place] LT RADIOACTIVE SEED LOC 10/10/2022 [facility]   BREAST LUMPECTOMY WITH RADIOACTIVE SEED AND SENTINEL LYMPH NODE BIOPSY Left 10/10/2022   Procedure: LEFT BREAST LUMPECTOMY WITH RADIOACTIVE SEED AND AXILLARY SENTINEL LYMPH NODE BIOPSY;  Surgeon: Emelia Loron, MD;  Location: [facility] OR;  Service: General;  Laterality: Left;   COLONOSCOPY  12/11/2005   lymphoid aggregate removed (suspected polyp was not), melanosis coli   PILONIDAL CYSTECTOMY     spine cyst   RADIOACTIVE SEED GUIDED AXILLARY SENTINEL LYMPH NODE Left 10/10/2022   Procedure: RADIOACTIVE SEED GUIDED LEFT AXILLARY SENTINEL LYMPH NODE EXCISION;  Surgeon: Emelia Loron, MD;  Location: [facility] OR;  Service: General;  Laterality: Left;   Patient Active Problem List    Diagnosis Date Noted   Malignant neoplasm of upper-inner quadrant of left breast in female, estrogen receptor positive (HCC) 09/25/2022   Osteopenia 04/10/2021   Hyperparathyroidism, primary (HCC) 03/14/2018   Multinodular goiter 03/14/2018   Hx of adenomatous polyp of colon 05/09/2017   Cyst of left kidney 10/21/2015   Prediabetes 08/19/2015   Hyperlipidemia 06/02/2014   DJD (degenerative joint disease) of knee 04/01/2013   Hemorrhoids, internal, with bleeding 07/04/2011   LACTOSE INTOLERANCE 11/18/2009   Allergic rhinitis 11/18/2009   History of herpes zoster 10/28/2008   Essential hypertension 01/24/2008    REFERRING DIAG: left breast cancer at risk for lymphedema  THERAPY DIAG:  Aftercare following surgery for neoplasm  Malignant neoplasm of upper-inner quadrant of left breast in female, estrogen receptor positive (HCC)  PERTINENT HISTORY: Lt lumpectomy due to IDC ER/PR positive breast cancer on 10/10/22. 1/6 positive nodes removed. Oncotype 17 and will not be getting chemotherapy  PRECAUTIONS: left UE Lymphedema risk, None  SUBJECTIVE: Pt returns for her 3 month L-Dex screen.   PAIN:  Are you having pain? No  SOZO SCREENING: Patient was assessed today using the SOZO machine to determine the lymphedema index score. This was compared to her baseline score. It was determined that she is within the recommended range when compared to her baseline and no further action is needed  at this time. She will continue SOZO screenings. These are done every 3 months for 2 years post operatively followed by every 6 months for 2 years, and then annually. Answered pts questions about SOZO.    L-DEX FLOWSHEETS - 08/10/23 0900       L-DEX LYMPHEDEMA SCREENING   Measurement Type Unilateral    L-DEX MEASUREMENT EXTREMITY Upper Extremity    POSITION  Standing    DOMINANT SIDE Right    At Risk Side Left    BASELINE SCORE (UNILATERAL) 0.9    L-DEX SCORE (UNILATERAL) 2.7    VALUE CHANGE  (UNILAT) 1.8               Keiosha Cancro, Julieanne Manson, PT 08/10/2023, 9:44 AM

## 2023-08-31 ENCOUNTER — Ambulatory Visit
Admission: RE | Admit: 2023-08-31 | Discharge: 2023-08-31 | Disposition: A | Discharge status: Home / Self Care | Payer: Medicare HMO | Source: Ambulatory Visit | Attending: Adult Health | Admitting: Adult Health

## 2023-08-31 DIAGNOSIS — Z9012 Acquired absence of left breast and nipple: Secondary | ICD-10-CM | POA: Diagnosis not present

## 2023-08-31 DIAGNOSIS — C50212 Malignant neoplasm of upper-inner quadrant of left female breast: Secondary | ICD-10-CM

## 2023-08-31 DIAGNOSIS — Z853 Personal history of malignant neoplasm of breast: Secondary | ICD-10-CM | POA: Diagnosis not present

## 2023-08-31 HISTORY — DX: Personal history of irradiation: Z92.3

## 2023-09-08 DIAGNOSIS — Z6827 Body mass index (BMI) 27.0-27.9, adult: Secondary | ICD-10-CM | POA: Diagnosis not present

## 2023-09-08 DIAGNOSIS — J018 Other acute sinusitis: Secondary | ICD-10-CM | POA: Diagnosis not present

## 2023-10-09 NOTE — Progress Notes (Unsigned)
 Subjective:    Patient ID: Angie Garcia, female    DOB: 10-23-1950, 73 y.o.   MRN: [id number]     HPI Angie Garcia is here for follow up of her chronic medical problems.  Exercises 30 minutes 3-5 times a week.  She is trying to eat healthy-she is trying to work on getting the cholesterol and sugar down.  Still has a palpable lump in her right upper leg.  She thinks it may have gotten a little bit larger-she is concerned about it.  Medications and allergies reviewed with patient and updated if appropriate.  Current Outpatient Medications on File Prior to Visit  Medication Sig Dispense Refill   Multiple Vitamin (MULTIVITAMIN) tablet Take 1 tablet by mouth daily.     anastrozole (ARIMIDEX) 1 MG tablet Take 1 tablet (1 mg total) by mouth daily. 90 tablet 3   cetirizine (ZYRTEC) 10 MG tablet Take 10 mg by mouth daily as needed for allergies.     diltiazem (CARDIZEM CD) 120 MG 24 hr capsule TAKE ONE CAPSULE BY MOUTH ONE TIME DAILY 90 capsule 3   lisinopril (ZESTRIL) 20 MG tablet TAKE ONE TABLET BY MOUTH ONE TIME DAILY 90 tablet 3   TURMERIC PO Take 1 capsule by mouth daily.     No current facility-administered medications on file prior to visit.     Review of Systems  Constitutional:  Negative for fever.  Respiratory:  Negative for cough, shortness of breath and wheezing.   Cardiovascular:  Negative for chest pain, palpitations and leg swelling.  Neurological:  Negative for light-headedness and headaches.       Objective:   Vitals:   10/10/23 0852  BP: 132/80  Pulse: 60  Temp: 98 F (36.7 C)  SpO2: 98%   BP Readings from Last 3 Encounters:  10/10/23 132/80  05/11/23 136/74  04/20/23 128/78   Wt Readings from Last 3 Encounters:  10/10/23 161 lb (73 kg)  05/11/23 167 lb 9.6 oz (76 kg)  05/04/23 165 lb 4 oz (75 kg)   Body mass index is 26.79 kg/m.    Physical Exam Constitutional:      General: She is not in acute distress.    Appearance: Normal appearance.   HENT:     Head: Normocephalic and atraumatic.  Eyes:     Conjunctiva/sclera: Conjunctivae normal.  Cardiovascular:     Rate and Rhythm: Normal rate and regular rhythm.     Heart sounds: Normal heart sounds.  Pulmonary:     Effort: Pulmonary effort is normal. No respiratory distress.     Breath sounds: Normal breath sounds. No wheezing.  Abdominal:     General: There is no distension.     Palpations: Abdomen is soft.     Tenderness: There is no abdominal tenderness.  Musculoskeletal:     Cervical back: Neck supple.     Right lower leg: No edema.     Left lower leg: No edema.  Lymphadenopathy:     Cervical: No cervical adenopathy.  Skin:    General: Skin is warm and dry.     Findings: No rash.  Neurological:     Mental Status: She is alert. Mental status is at baseline.  Psychiatric:        Mood and Affect: Mood normal.        Behavior: Behavior normal.        Lab Results  Component Value Date   WBC 5.5 04/20/2023   HGB 12.9 04/20/2023  HCT 40.5 04/20/2023   PLT 337.0 04/20/2023   GLUCOSE 91 04/20/2023   CHOL 232 (H) 04/20/2023   TRIG 59.0 04/20/2023   HDL 78.00 04/20/2023   LDLDIRECT 120.1 09/09/2012   LDLCALC 142 (H) 04/20/2023   ALT 15 04/20/2023   AST 20 04/20/2023   NA 141 04/20/2023   K 4.3 04/20/2023   CL 106 04/20/2023   CREATININE 0.75 04/20/2023   BUN 16 04/20/2023   CO2 27 04/20/2023   TSH 0.88 04/20/2023   HGBA1C 5.7 04/20/2023     Assessment & Plan:    See Problem List for Assessment and Plan of chronic medical problems.

## 2023-10-09 NOTE — Patient Instructions (Addendum)
 Blood work was ordered.       Medications changes include :   None   An ultrasound of your leg was ordered.    Return in about 6 months (around 04/10/2024) for follow up.   Health Maintenance, Female Adopting a healthy lifestyle and getting preventive care are important in promoting health and wellness. Ask your health care provider about: The right schedule for you to have regular tests and exams. Things you can do on your own to prevent diseases and keep yourself healthy. What should I know about diet, weight, and exercise? Eat a healthy diet  Eat a diet that includes plenty of vegetables, fruits, low-fat dairy products, and lean protein. Do not eat a lot of foods that are high in solid fats, added sugars, or sodium. Maintain a healthy weight Body mass index (BMI) is used to identify weight problems. It estimates body fat based on height and weight. Your health care provider can help determine your BMI and help you achieve or maintain a healthy weight. Get regular exercise Get regular exercise. This is one of the most important things you can do for your health. Most adults should: Exercise for at least 150 minutes each week. The exercise should increase your heart rate and make you sweat (moderate-intensity exercise). Do strengthening exercises at least twice a week. This is in addition to the moderate-intensity exercise. Spend less time sitting. Even light physical activity can be beneficial. Watch cholesterol and blood lipids Have your blood tested for lipids and cholesterol at 71 years of age, then have this test every 5 years. Have your cholesterol levels checked more often if: Your lipid or cholesterol levels are high. You are older than 71 years of age. You are at high risk for heart disease. What should I know about cancer screening? Depending on your health history and family history, you may need to have cancer screening at various ages. This may include  screening for: Breast cancer. Cervical cancer. Colorectal cancer. Skin cancer. Lung cancer. What should I know about heart disease, diabetes, and high blood pressure? Blood pressure and heart disease High blood pressure causes heart disease and increases the risk of stroke. This is more likely to develop in people who have high blood pressure readings or are overweight. Have your blood pressure checked: Every 3-5 years if you are 42-32 years of age. Every year if you are 75 years old or older. Diabetes Have regular diabetes screenings. This checks your fasting blood sugar level. Have the screening done: Once every three years after age 66 if you are at a normal weight and have a low risk for diabetes. More often and at a younger age if you are overweight or have a high risk for diabetes. What should I know about preventing infection? Hepatitis B If you have a higher risk for hepatitis B, you should be screened for this virus. Talk with your health care provider to find out if you are at risk for hepatitis B infection. Hepatitis C Testing is recommended for: Everyone born from [birth year] through 1963. Anyone with known risk factors for hepatitis C. Sexually transmitted infections (STIs) Get screened for STIs, including gonorrhea and chlamydia, if: You are sexually active and are younger than 71 years of age. You are older than 71 years of age and your health care provider tells you that you are at risk for this type of infection. Your sexual activity has changed since you were last screened, and you  are at increased risk for chlamydia or gonorrhea. Ask your health care provider if you are at risk. Ask your health care provider about whether you are at high risk for HIV. Your health care provider may recommend a prescription medicine to help prevent HIV infection. If you choose to take medicine to prevent HIV, you should first get tested for HIV. You should then be tested every 3 months for as  long as you are taking the medicine. Pregnancy If you are about to stop having your period (premenopausal) and you may become pregnant, seek counseling before you get pregnant. Take 400 to 800 micrograms (mcg) of folic acid every day if you become pregnant. Ask for birth control (contraception) if you want to prevent pregnancy. Osteoporosis and menopause Osteoporosis is a disease in which the bones lose minerals and strength with aging. This can result in bone fractures. If you are 17 years old or older, or if you are at risk for osteoporosis and fractures, ask your health care provider if you should: Be screened for bone loss. Take a calcium or vitamin D supplement to lower your risk of fractures. Be given hormone replacement therapy (HRT) to treat symptoms of menopause. Follow these instructions at home: Alcohol use Do not drink alcohol if: Your health care provider tells you not to drink. You are pregnant, may be pregnant, or are planning to become pregnant. If you drink alcohol: Limit how much you have to: 0-1 drink a day. Know how much alcohol is in your drink. In the [place] one drink equals one 12 oz bottle of beer (355 mL), one 5 oz glass of wine (148 mL), or one 1 oz glass of hard liquor (44 mL). Lifestyle Do not use any products that contain nicotine or tobacco. These products include cigarettes, chewing tobacco, and vaping devices, such as e-cigarettes. If you need help quitting, ask your health care provider. Do not use street drugs. Do not share needles. Ask your health care provider for help if you need support or information about quitting drugs. General instructions Schedule regular health, dental, and eye exams. Stay current with your vaccines. Tell your health care provider if: You often feel depressed. You have ever been abused or do not feel safe at home. Summary Adopting a healthy lifestyle and getting preventive care are important in promoting health and  wellness. Follow your health care provider's instructions about healthy diet, exercising, and getting tested or screened for diseases. Follow your health care provider's instructions on monitoring your cholesterol and blood pressure. This information is not intended to replace advice given to you by your health care provider. Make sure you discuss any questions you have with your health care provider. Document Revised: 11/01/2020 Document Reviewed: 11/01/2020 Elsevier Patient Education  2024 [employer].

## 2023-10-10 ENCOUNTER — Ambulatory Visit (INDEPENDENT_AMBULATORY_CARE_PROVIDER_SITE_OTHER): Admitting: Internal Medicine

## 2023-10-10 VITALS — BP 132/80 | HR 60 | Temp 98.0°F | Ht 65.0 in | Wt 161.0 lb

## 2023-10-10 DIAGNOSIS — R7303 Prediabetes: Secondary | ICD-10-CM

## 2023-10-10 DIAGNOSIS — I10 Essential (primary) hypertension: Secondary | ICD-10-CM | POA: Diagnosis not present

## 2023-10-10 DIAGNOSIS — R2241 Localized swelling, mass and lump, right lower limb: Secondary | ICD-10-CM | POA: Insufficient documentation

## 2023-10-10 DIAGNOSIS — E782 Mixed hyperlipidemia: Secondary | ICD-10-CM | POA: Diagnosis not present

## 2023-10-10 DIAGNOSIS — M8589 Other specified disorders of bone density and structure, multiple sites: Secondary | ICD-10-CM | POA: Diagnosis not present

## 2023-10-10 LAB — COMPREHENSIVE METABOLIC PANEL WITH GFR
ALT: 14 U/L (ref 0–35)
AST: 19 U/L (ref 0–37)
Albumin: 4.3 g/dL (ref 3.5–5.2)
Alkaline Phosphatase: 102 U/L (ref 39–117)
BUN: 18 mg/dL (ref 6–23)
CO2: 26 meq/L (ref 19–32)
Calcium: 11.1 mg/dL — ABNORMAL HIGH (ref 8.4–10.5)
Chloride: 106 meq/L (ref 96–112)
Creatinine, Ser: 0.84 mg/dL (ref 0.40–1.20)
GFR: 70.21 mL/min (ref >60.00)
Glucose, Bld: 100 mg/dL — ABNORMAL HIGH (ref 70–99)
Potassium: 3.8 meq/L (ref 3.5–5.1)
Sodium: 139 meq/L (ref 135–145)
Total Bilirubin: 0.4 mg/dL (ref 0.2–1.2)
Total Protein: 7 g/dL (ref 6.0–8.3)

## 2023-10-10 LAB — CBC WITH DIFFERENTIAL/PLATELET
Basophils Absolute: 0 10*3/uL (ref 0.0–0.1)
Basophils Relative: 0.7 % (ref 0.0–3.0)
Eosinophils Absolute: 0.1 10*3/uL (ref 0.0–0.7)
Eosinophils Relative: 2.4 % (ref 0.0–5.0)
HCT: 41.5 % (ref 36.0–46.0)
Hemoglobin: 13.4 g/dL (ref 12.0–15.0)
Lymphocytes Relative: 24.2 % (ref 12.0–46.0)
Lymphs Abs: 1.2 10*3/uL (ref 0.7–4.0)
MCHC: 32.4 g/dL (ref 30.0–36.0)
MCV: 87.6 fl (ref 78.0–100.0)
Monocytes Absolute: 0.3 10*3/uL (ref 0.1–1.0)
Monocytes Relative: 7 % (ref 3.0–12.0)
Neutro Abs: 3.3 10*3/uL (ref 1.4–7.7)
Neutrophils Relative %: 65.7 % (ref 43.0–77.0)
Platelets: 313 10*3/uL (ref 150.0–400.0)
RBC: 4.73 Mil/uL (ref 3.87–5.11)
RDW: 13.8 % (ref 11.5–15.5)
WBC: 5 10*3/uL (ref 4.0–10.5)

## 2023-10-10 LAB — LIPID PANEL
Cholesterol: 229 mg/dL — ABNORMAL HIGH (ref 0–200)
HDL: 74.1 mg/dL (ref >39.00)
LDL Cholesterol: 142 mg/dL — ABNORMAL HIGH (ref 0–99)
NonHDL: 154.71
Total CHOL/HDL Ratio: 3
Triglycerides: 62 mg/dL (ref 0.0–149.0)
VLDL: 12.4 mg/dL (ref 0.0–40.0)

## 2023-10-10 LAB — TSH: TSH: 0.8 u[IU]/mL (ref 0.35–5.50)

## 2023-10-10 LAB — HEMOGLOBIN A1C: Hgb A1c MFr Bld: 5.7 % (ref 4.6–6.5)

## 2023-10-10 NOTE — Assessment & Plan Note (Signed)
 Subacute Probable skin lump right proximal lateral thigh ?  Gotten larger Likely lipoma Will order an ultrasound to evaluate further and confirm

## 2023-10-10 NOTE — Assessment & Plan Note (Addendum)
 Chronic DEXA scheduled for July Taking a MVI Encouraged high calcium diet

## 2023-10-10 NOTE — Assessment & Plan Note (Addendum)
 Chronic Regular exercise and healthy diet encouraged Check lipid panel  Currently lifestyle controlled Discussed starting a statin given elevated ASCVD risk.  She is concerned about her cholesterol, but would prefer to be not on medication unless needed We have discussed a CT coronary calcium score test to evaluate her risk and depending on blood work results she will consider this

## 2023-10-10 NOTE — Assessment & Plan Note (Signed)
 Chronic Lab Results  Component Value Date   HGBA1C 5.7 04/20/2023   Check a1c Low sugar / carb diet Stressed regular exercise

## 2023-10-10 NOTE — Assessment & Plan Note (Addendum)
 Chronic Blood pressure controlled CMP, cbc Continue diltiazem 120 mg daily, lisinopril 20 mg daily

## 2023-10-11 ENCOUNTER — Encounter: Payer: Self-pay | Admitting: Internal Medicine

## 2023-10-12 ENCOUNTER — Telehealth: Payer: Self-pay | Admitting: Hematology and Oncology

## 2023-10-12 NOTE — Telephone Encounter (Signed)
 Left vm about rescheduled appt date and time.

## 2023-10-19 ENCOUNTER — Ambulatory Visit: Payer: Medicare HMO | Admitting: Internal Medicine

## 2023-10-29 ENCOUNTER — Telehealth: Payer: Self-pay | Admitting: Adult Health

## 2023-10-29 NOTE — Telephone Encounter (Signed)
 Angie Garcia rescheduled her appointment and is aware of all appointment details.

## 2023-11-09 ENCOUNTER — Ambulatory Visit: Payer: Medicare HMO | Admitting: Hematology and Oncology

## 2023-11-09 ENCOUNTER — Ambulatory Visit: Payer: Medicare HMO | Attending: Hematology and Oncology | Admitting: Rehabilitation

## 2023-11-09 DIAGNOSIS — Z170 Estrogen receptor positive status [ER+]: Secondary | ICD-10-CM | POA: Insufficient documentation

## 2023-11-09 DIAGNOSIS — Z483 Aftercare following surgery for neoplasm: Secondary | ICD-10-CM | POA: Insufficient documentation

## 2023-11-09 DIAGNOSIS — C50212 Malignant neoplasm of upper-inner quadrant of left female breast: Secondary | ICD-10-CM | POA: Insufficient documentation

## 2023-11-09 NOTE — Therapy (Signed)
 OUTPATIENT PHYSICAL THERAPY SOZO SCREENING NOTE   Patient Name: Angie Garcia MRN: [id number] DOB:1950/10/28, 73 y.o., female Today's Date: 11/09/2023  PCP: Colene Dauphin, MD REFERRING PROVIDER: Murleen Arms, MD   PT End of Session - 11/09/23 1119     Visit Number 2   screen   PT Start Time 1041    PT Stop Time 1054    PT Time Calculation (min) 13 min    Activity Tolerance Patient tolerated treatment well    Behavior During Therapy WFL for tasks assessed/performed             Past Medical History:  Diagnosis Date   Allergy    Arthritis    Baker's cyst    Left knee   Breast cancer (HCC)    History of radiation therapy    Left breast- 12/05/22-01/22/23-Dr. Retta Caster   Hx of adenomatous polyp of colon 05/09/2017   Hypertension    Lactose intolerance    Personal history of radiation therapy    Shingles 10/2008   RUE   Past Surgical History:  Procedure Laterality Date   ABDOMINAL HYSTERECTOMY     Dr Thurl Floras for fibroids   BREAST BIOPSY Left 09/18/2022   US  LT BREAST BX W LOC DEV 1ST LESION IMG BX SPEC US  GUIDE 09/18/2022 [facility]   BREAST BIOPSY  10/10/2022   MM LT RADIOACTIVE SEED LOC MAMMO GUIDE 10/10/2022 [facility]   BREAST BIOPSY Left 10/10/2022   US  LT RADIOACTIVE SEED LOC 10/10/2022 [facility]   BREAST LUMPECTOMY WITH RADIOACTIVE SEED AND SENTINEL LYMPH NODE BIOPSY Left 10/10/2022   Procedure: LEFT BREAST LUMPECTOMY WITH RADIOACTIVE SEED AND AXILLARY SENTINEL LYMPH NODE BIOPSY;  Surgeon: Enid Harry, MD;  Location: [facility] OR;  Service: General;  Laterality: Left;   COLONOSCOPY  12/11/2005   lymphoid aggregate removed (suspected polyp was not), melanosis coli   PILONIDAL CYSTECTOMY     spine cyst   RADIOACTIVE SEED GUIDED AXILLARY SENTINEL LYMPH NODE Left 10/10/2022   Procedure: RADIOACTIVE SEED GUIDED LEFT AXILLARY SENTINEL LYMPH NODE EXCISION;  Surgeon: Enid Harry, MD;  Location: [facility] OR;  Service: General;  Laterality:  Left;   Patient Active Problem List   Diagnosis Date Noted   Skin lump of leg, right 10/10/2023   Malignant neoplasm of upper-inner quadrant of left breast in female, estrogen receptor positive (HCC) 09/25/2022   Osteopenia 04/10/2021   Hyperparathyroidism, primary (HCC) 03/14/2018   Multinodular goiter 03/14/2018   Hx of adenomatous polyp of colon 05/09/2017   Cyst of left kidney 10/21/2015   Prediabetes 08/19/2015   Hyperlipidemia 06/02/2014   DJD (degenerative joint disease) of knee 04/01/2013   Hemorrhoids, internal, with bleeding 07/04/2011   LACTOSE INTOLERANCE 11/18/2009   Allergic rhinitis 11/18/2009   History of herpes zoster 10/28/2008   Essential hypertension 01/24/2008    REFERRING DIAG: left breast cancer at risk for lymphedema  THERAPY DIAG:  Aftercare following surgery for neoplasm  Malignant neoplasm of upper-inner quadrant of left breast in female, estrogen receptor positive (HCC)  PERTINENT HISTORY: Lt lumpectomy due to IDC ER/PR positive breast cancer on 10/10/22. 1/6 positive nodes removed. Oncotype 17 and will not be getting chemotherapy  PRECAUTIONS: left UE Lymphedema risk, None  SUBJECTIVE: Pt returns for her 3 month L-Dex screen.   PAIN:  Are you having pain? No  SOZO SCREENING: Patient was assessed today using the SOZO machine to determine the lymphedema index score. This was compared to her baseline score. It was determined that she  is within the recommended range when compared to her baseline and no further action is needed at this time. She will continue SOZO screenings. These are done every 3 months for 2 years post operatively followed by every 6 months for 2 years, and then annually. Answered pts questions about SOZO.    L-DEX FLOWSHEETS - 11/09/23 1100       L-DEX LYMPHEDEMA SCREENING   Measurement Type Unilateral    L-DEX MEASUREMENT EXTREMITY Upper Extremity    POSITION  Standing    DOMINANT SIDE Right    At Risk Side Left     BASELINE SCORE (UNILATERAL) 0.9    L-DEX SCORE (UNILATERAL) 0    VALUE CHANGE (UNILAT) -0.9                Bosco Paparella R, PT 11/09/2023, 11:20 AM

## 2023-11-12 ENCOUNTER — Ambulatory Visit
Admission: EM | Admit: 2023-11-12 | Discharge: 2023-11-12 | Disposition: A | Discharge status: Home / Self Care | Attending: Internal Medicine | Admitting: Internal Medicine

## 2023-11-12 ENCOUNTER — Ambulatory Visit: Admitting: Radiology

## 2023-11-12 DIAGNOSIS — R059 Cough, unspecified: Secondary | ICD-10-CM | POA: Diagnosis not present

## 2023-11-12 DIAGNOSIS — R051 Acute cough: Secondary | ICD-10-CM

## 2023-11-12 DIAGNOSIS — J069 Acute upper respiratory infection, unspecified: Secondary | ICD-10-CM | POA: Diagnosis not present

## 2023-11-12 DIAGNOSIS — R0989 Other specified symptoms and signs involving the circulatory and respiratory systems: Secondary | ICD-10-CM | POA: Diagnosis not present

## 2023-11-12 MED ORDER — LORATADINE 10 MG PO TABS: 10.0000 mg | ORAL_TABLET | Freq: Every day | ORAL | 0 refills | Status: DC

## 2023-11-12 MED ORDER — BENZONATATE 100 MG PO CAPS: 100.0000 mg | ORAL_CAPSULE | Freq: Three times a day (TID) | ORAL | 0 refills | Status: DC

## 2023-11-12 NOTE — Discharge Instructions (Signed)

## 2023-11-12 NOTE — ED Triage Notes (Signed)
 Pt c/o cough and congestion for 2 weeks

## 2023-11-13 ENCOUNTER — Ambulatory Visit (HOSPITAL_COMMUNITY): Payer: Self-pay

## 2023-11-13 NOTE — ED Provider Notes (Signed)
 [facility]    CSN: [id number] Arrival date & time: 11/12/23  1825      History   Chief Complaint Chief Complaint  Patient presents with   Nasal Congestion   Cough    HPI Angie Garcia is a 73 y.o. female.   Angie Garcia is a 73 y.o. female presenting for chief complaint of Nasal Congestion and Cough that started approximately 2 weeks ago.  Cough initially with brown sputum, sputum is now clear.  Nasal congestion and mucus is clear as well.  She denies facial pressure, dizziness, ear pain, tinnitus, shortness of breath, chest pain, leg swelling, heart palpitations, rash, and fever/chills. Denies recent sick contacts with similar symptoms, history of asthma/copd. Former smoker. Denies recent antibiotic/steroid use. She is most bothered by her persistent cough. She reports some relief with tessalon  perles.    Cough   Past Medical History:  Diagnosis Date   Allergy    Arthritis    Baker's cyst    Left knee   Breast cancer (HCC)    History of radiation therapy    Left breast- 12/05/22-01/22/23-Dr. Retta Caster   Hx of adenomatous polyp of colon 05/09/2017   Hypertension    Lactose intolerance    Personal history of radiation therapy    Shingles 10/2008   RUE    Patient Active Problem List   Diagnosis Date Noted   Skin lump of leg, right 10/10/2023   Malignant neoplasm of upper-inner quadrant of left breast in female, estrogen receptor positive (HCC) 09/25/2022   Osteopenia 04/10/2021   Hyperparathyroidism, primary (HCC) 03/14/2018   Multinodular goiter 03/14/2018   Hx of adenomatous polyp of colon 05/09/2017   Cyst of left kidney 10/21/2015   Prediabetes 08/19/2015   Hyperlipidemia 06/02/2014   DJD (degenerative joint disease) of knee 04/01/2013   Hemorrhoids, internal, with bleeding 07/04/2011   LACTOSE INTOLERANCE 11/18/2009   Allergic rhinitis 11/18/2009   History of herpes zoster 10/28/2008   Essential hypertension 01/24/2008    Past  Surgical History:  Procedure Laterality Date   ABDOMINAL HYSTERECTOMY     Dr Thurl Floras for fibroids   BREAST BIOPSY Left 09/18/2022   US  LT BREAST BX W LOC DEV 1ST LESION IMG BX SPEC US  GUIDE 09/18/2022 [facility]   BREAST BIOPSY  10/10/2022   MM LT RADIOACTIVE SEED LOC MAMMO GUIDE 10/10/2022 [facility]   BREAST BIOPSY Left 10/10/2022   US  LT RADIOACTIVE SEED LOC 10/10/2022 [facility]   BREAST LUMPECTOMY WITH RADIOACTIVE SEED AND SENTINEL LYMPH NODE BIOPSY Left 10/10/2022   Procedure: LEFT BREAST LUMPECTOMY WITH RADIOACTIVE SEED AND AXILLARY SENTINEL LYMPH NODE BIOPSY;  Surgeon: Enid Harry, MD;  Location: [facility];  Service: General;  Laterality: Left;   COLONOSCOPY  12/11/2005   lymphoid aggregate removed (suspected polyp was not), melanosis coli   PILONIDAL CYSTECTOMY     spine cyst   RADIOACTIVE SEED GUIDED AXILLARY SENTINEL LYMPH NODE Left 10/10/2022   Procedure: RADIOACTIVE SEED GUIDED LEFT AXILLARY SENTINEL LYMPH NODE EXCISION;  Surgeon: Enid Harry, MD;  Location: [facility];  Service: General;  Laterality: Left;    OB History   No obstetric history on file.      Home Medications    Prior to Admission medications   Medication Sig Start Date End Date Taking? Authorizing Provider  benzonatate  (TESSALON ) 100 MG capsule Take 1 capsule (100 mg total) by mouth every 8 (eight) hours. 11/12/23  Yes Starlene Eaton, FNP  loratadine  (CLARITIN ) 10 MG tablet  Take 1 tablet (10 mg total) by mouth daily. 11/12/23  Yes Starlene Eaton, FNP  anastrozole  (ARIMIDEX ) 1 MG tablet Take 1 tablet (1 mg total) by mouth daily. 01/18/23   Iruku, Praveena, MD  cetirizine  (ZYRTEC ) 10 MG tablet Take 10 mg by mouth daily as needed for allergies.    [provider]  diltiazem  (CARDIZEM  CD) 120 MG 24 hr capsule TAKE ONE CAPSULE BY MOUTH ONE TIME DAILY 01/09/23   Colene Dauphin, MD  lisinopril  (ZESTRIL ) 20 MG tablet TAKE ONE TABLET BY MOUTH ONE TIME DAILY 01/09/23   Colene Dauphin, MD  Multiple Vitamin (MULTIVITAMIN) tablet Take 1 tablet by mouth daily.    [provider]  TURMERIC PO Take 1 capsule by mouth daily.    [provider]    Family History Family History  Problem Relation Age of Onset   Hypertension Mother    Breast cancer Mother 65   Diabetes Father    Stroke Father        MINI STROKES   Stroke Sister 22   Diabetes Sister    Cancer Maternal Uncle        UNKNOWN TYPE   Colon cancer Neg Hx    Esophageal cancer Neg Hx    Pancreatic cancer Neg Hx    Rectal cancer Neg Hx    Stomach cancer Neg Hx    Hyperparathyroidism Neg Hx     Social History Social History   Tobacco Use   Smoking status: Former    Current packs/day: 0.00    Types: Cigarettes    Quit date: 06/27/1995    Years since quitting: 28.4   Smokeless tobacco: Never   Tobacco comments:    smoked [age], up to < 1 ppd  Vaping Use   Vaping status: Never Used  Substance Use Topics   Alcohol use: No   Drug use: No     Allergies   Sulfamethoxazole-trimethoprim   Review of Systems Review of Systems  Respiratory:  Positive for cough.   Per HPI   Physical Exam Triage Vital Signs ED Triage Vitals  Encounter Vitals Group     BP 11/12/23 1904 (!) 156/86     Systolic BP Percentile --      Diastolic BP Percentile --      Pulse Rate 11/12/23 1904 70     Resp 11/12/23 1904 17     Temp 11/12/23 1908 98.4 F (36.9 C)     Temp Source 11/12/23 1904 Oral     SpO2 11/12/23 1904 99 %     Weight --      Height --      Head Circumference --      Peak Flow --      Pain Score 11/12/23 1909 0     Pain Loc --      Pain Education --      Exclude from Growth Chart --    No data found.  Updated Vital Signs BP (!) 149/83 (BP Location: Left Arm)   Pulse 70   Temp 98.4 F (36.9 C) (Oral)   Resp 17   SpO2 99%   Visual Acuity Right Eye Distance:   Left Eye Distance:   Bilateral Distance:    Right Eye Near:   Left Eye Near:    Bilateral Near:      Physical Exam Vitals and nursing note reviewed.  Constitutional:      Appearance: She is not ill-appearing or toxic-appearing.  HENT:  Head: Normocephalic and atraumatic.     Right Ear: Hearing, tympanic membrane, ear canal and external ear normal.     Left Ear: Hearing, tympanic membrane, ear canal and external ear normal.     Nose: Nose normal.     Mouth/Throat:     Lips: Pink.     Mouth: Mucous membranes are moist. No injury or oral lesions.     Dentition: Normal dentition.     Tongue: No lesions.     Pharynx: Oropharynx is clear. Uvula midline. No pharyngeal swelling, oropharyngeal exudate, posterior oropharyngeal erythema, uvula swelling or postnasal drip.     Tonsils: No tonsillar exudate.  Eyes:     General: Lids are normal. Vision grossly intact. Gaze aligned appropriately.     Extraocular Movements: Extraocular movements intact.     Conjunctiva/sclera: Conjunctivae normal.  Neck:     Trachea: Trachea and phonation normal.  Cardiovascular:     Rate and Rhythm: Normal rate and regular rhythm.     Heart sounds: Normal heart sounds, S1 normal and S2 normal.  Pulmonary:     Effort: Pulmonary effort is normal. No respiratory distress.     Breath sounds: Normal breath sounds and air entry. No wheezing, rhonchi or rales.  Chest:     Chest wall: No tenderness.  Musculoskeletal:     Cervical back: Neck supple.     Right lower leg: No edema.     Left lower leg: No edema.  Lymphadenopathy:     Cervical: No cervical adenopathy.  Skin:    General: Skin is warm and dry.     Capillary Refill: Capillary refill takes less than 2 seconds.     Findings: No rash.  Neurological:     General: No focal deficit present.     Mental Status: She is alert and oriented to person, place, and time. Mental status is at baseline.     Cranial Nerves: No dysarthria or facial asymmetry.  Psychiatric:        Mood and Affect: Mood normal.        Speech: Speech normal.        Behavior:  Behavior normal.        Thought Content: Thought content normal.        Judgment: Judgment normal.      UC Treatments / Results  Labs (all labs ordered are listed, but only abnormal results are displayed) Labs Reviewed - No data to display  EKG   Radiology DG Chest 2 View Result Date: 11/12/2023 CLINICAL DATA:  cough and congestion for 2 weeks EXAM: CHEST - 2 VIEW COMPARISON:  August 28, 2017 FINDINGS: No focal airspace consolidation, pleural effusion, or pneumothorax. No cardiomegaly. No acute fracture or destructive lesion. Surgical clips in the left axilla. IMPRESSION: No acute cardiopulmonary abnormality. Electronically Signed   By: Rance Burrows M.D.   On: 11/12/2023 20:12    Procedures Procedures (including critical care time)  Medications Ordered in UC Medications - No data to display  Initial Impression / Assessment and Plan / UC Course  I have reviewed the triage vital signs and the nursing notes.  Pertinent labs & imaging results that were available during my care of the patient were reviewed by me and considered in my medical decision making (see chart for details).   1. Acute cough, viral uri with cough Suspect viral URI, viral syndrome with post-viral cough.  Physical exam findings reassuring, vital signs hemodynamically stable, and lungs clear.  Chest x-ray on my  review shows stable findings without signs of acute cardiopulmonary process on wet read- staff will call if radiology re-read shows abnormal finding.  Low suspicion for acute bacterial sinusitis/bronchitis. No leg swelling, no signs of pleural effusion on exam or chest x-ray.  Advised supportive care/prescriptions for symptomatic relief as outlined in AVS.    Counseled patient on potential for adverse effects with medications prescribed/recommended today, strict ER and return-to-clinic precautions discussed, patient verbalized understanding.    Final Clinical Impressions(s) / UC Diagnoses   Final  diagnoses:  Acute cough  Viral URI with cough   Discharge Instructions      You have a viral illness which will improve on its own with rest, fluids, and medications to help with your symptoms.  Tylenol , guaifenesin (plain mucinex), and saline nasal sprays may help relieve symptoms.   Two teaspoons of honey in 1 cup of warm water every 4-6 hours may help with throat pains.  Humidifier in room at nighttime may help soothe cough (clean well daily).   For chest pain, shortness of breath, inability to keep food or fluids down without vomiting, fever that does not respond to tylenol  or motrin, or any other severe symptoms, please go to the ER for further evaluation. Return to urgent care as needed, otherwise follow-up with PCP.    ED Prescriptions     Medication Sig Dispense Auth. Provider   loratadine  (CLARITIN ) 10 MG tablet Take 1 tablet (10 mg total) by mouth daily. 30 tablet Shella Devoid M, FNP   benzonatate  (TESSALON ) 100 MG capsule Take 1 capsule (100 mg total) by mouth every 8 (eight) hours. 21 capsule Starlene Eaton, FNP      PDMP not reviewed this encounter.   Shella Devoid [place], [place] 11/13/23 [phone]

## 2023-11-15 ENCOUNTER — Ambulatory Visit: Admitting: Adult Health

## 2023-11-22 ENCOUNTER — Inpatient Hospital Stay: Attending: Adult Health | Admitting: Adult Health

## 2023-11-22 ENCOUNTER — Encounter: Payer: Self-pay | Admitting: Adult Health

## 2023-11-22 VITALS — BP 158/90 | HR 65 | Temp 98.1°F | Resp 19 | Wt 160.2 lb

## 2023-11-22 DIAGNOSIS — Z87891 Personal history of nicotine dependence: Secondary | ICD-10-CM | POA: Diagnosis not present

## 2023-11-22 DIAGNOSIS — Z1721 Progesterone receptor positive status: Secondary | ICD-10-CM | POA: Diagnosis not present

## 2023-11-22 DIAGNOSIS — I10 Essential (primary) hypertension: Secondary | ICD-10-CM | POA: Diagnosis not present

## 2023-11-22 DIAGNOSIS — J40 Bronchitis, not specified as acute or chronic: Secondary | ICD-10-CM | POA: Insufficient documentation

## 2023-11-22 DIAGNOSIS — Z923 Personal history of irradiation: Secondary | ICD-10-CM | POA: Insufficient documentation

## 2023-11-22 DIAGNOSIS — Z79811 Long term (current) use of aromatase inhibitors: Secondary | ICD-10-CM | POA: Insufficient documentation

## 2023-11-22 DIAGNOSIS — Z803 Family history of malignant neoplasm of breast: Secondary | ICD-10-CM | POA: Insufficient documentation

## 2023-11-22 DIAGNOSIS — Z170 Estrogen receptor positive status [ER+]: Secondary | ICD-10-CM | POA: Insufficient documentation

## 2023-11-22 DIAGNOSIS — C50212 Malignant neoplasm of upper-inner quadrant of left female breast: Secondary | ICD-10-CM | POA: Insufficient documentation

## 2023-11-22 DIAGNOSIS — Z90710 Acquired absence of both cervix and uterus: Secondary | ICD-10-CM | POA: Insufficient documentation

## 2023-11-22 DIAGNOSIS — J329 Chronic sinusitis, unspecified: Secondary | ICD-10-CM | POA: Insufficient documentation

## 2023-11-22 DIAGNOSIS — Z1732 Human epidermal growth factor receptor 2 negative status: Secondary | ICD-10-CM | POA: Diagnosis not present

## 2023-11-22 NOTE — Progress Notes (Unsigned)
 [facility] Cancer Follow up:    Angie Dauphin, MD [address] [place] [address]   DIAGNOSIS: Cancer Staging  Malignant neoplasm of upper-inner quadrant of left breast in female, estrogen receptor positive (HCC) Staging form: Breast, AJCC 8th Edition - Pathologic stage from 01/18/2023: Stage IB (pT1c, pN1, cM0, G3, ER+, PR+, HER2-) - Signed by Murleen Arms, MD on 01/18/2023 Stage prefix: Initial diagnosis Histologic grading system: 3 grade system    SUMMARY OF ONCOLOGIC HISTORY: Oncology History  Malignant neoplasm of upper-inner quadrant of left breast in female, estrogen receptor positive (HCC)  08/29/2022 Mammogram   Bilateral screening mammogram showed possible mass in the left breast warranting further evaluation.  No suspicious findings for malignancy in the right breast.  Diagnostic mammogram confirmed a suspicious mass in the left breast at 9:00 measuring 1.8 cm, suspicious left axillary lymph node.  Ultrasound confirmed the above-mentioned findings.   09/18/2022 Pathology Results   Pathology from the left breast needle core biopsy at 9:00 2 cm from nipple showed overall grade 2 invasive ductal carcinoma, prognostic showed ER 100% positive strong staining PR 75% positive moderate to strong staining Ki-67 of 2% and HER2 negative.  On the lymph node biopsy there is a comment with states that the nuclear features are similar but there is obvious ductular differentiation which is different compared to the primary specimen hence prognostics were also repeated on the lymph node which once again confirmed ER 100% positive strong staining PR 30% positive moderate to strong staining Ki-67 of 5% and group 5 HER2 negative   09/25/2022 Initial Diagnosis   Malignant neoplasm of upper-inner quadrant of left breast in female, estrogen receptor positive   12/05/2022 - 01/22/2023 Radiation Therapy   Site/dose:   1) Left breast - 50.4 Gy delivered in 28 Fx at 1.8 Gy/Fx 2)  Left breast_SCV - 50.4 Gy delivered in 28 Fx at 1.8 Gy/Fx 3) Left breast boost - 12 Gy delivered in 6 Fx at 2 Gy/Fx   01/18/2023 Cancer Staging   Staging form: Breast, AJCC 8th Edition - Pathologic stage from 01/18/2023: Stage IB (pT1c, pN1, cM0, G3, ER+, PR+, HER2-) - Signed by Murleen Arms, MD on 01/18/2023 Stage prefix: Initial diagnosis Histologic grading system: 3 grade system   01/2023 -  Anti-estrogen oral therapy   Anastrozole  x 5 years     CURRENT THERAPY: anastrozole   INTERVAL HISTORY:  Discussed the use of AI scribe software for clinical note transcription with the patient, who gave verbal consent to proceed.  History of Present Illness Angie Garcia is a 73 year old female with a history of breast cancer who presents for follow-up.  She was diagnosed with stage 1B ERPR positive breast cancer in April 2024 and underwent a lumpectomy followed by adjuvant radiation and antiestrogen therapy with anastrozole , which she started in August 2024. She continues to take anastrozole  daily. Her most recent mammogram was on August 31, 2023, and she is scheduled for bone density testing on January 04, 2024.  She is experiencing a sinus infection with significant sinus pressure, coughing, and wheezing. Over-the-counter medications, including Afrin and decongestants, have caused her blood pressure to rise, leading her to discontinue their use.  She has arthritis with some aches but no new bone pain. Cholesterol levels remain elevated, and calcium levels are slightly high, though she is not taking calcium supplements.  There are no new breast changes, shortness of breath, or other health changes outside of her respiratory symptoms. A  chest x-ray at urgent care provided reassurance regarding her respiratory condition.     Patient Active Problem List   Diagnosis Date Noted   Skin lump of leg, right 10/10/2023   Malignant neoplasm of upper-inner quadrant of left breast in female, estrogen  receptor positive (HCC) 09/25/2022   Osteopenia 04/10/2021   Hyperparathyroidism, primary (HCC) 03/14/2018   Multinodular goiter 03/14/2018   Hx of adenomatous polyp of colon 05/09/2017   Cyst of left kidney 10/21/2015   Prediabetes 08/19/2015   Hyperlipidemia 06/02/2014   DJD (degenerative joint disease) of knee 04/01/2013   Hemorrhoids, internal, with bleeding 07/04/2011   LACTOSE INTOLERANCE 11/18/2009   Allergic rhinitis 11/18/2009   History of herpes zoster 10/28/2008   Essential hypertension 01/24/2008    is allergic to sulfamethoxazole-trimethoprim.  MEDICAL HISTORY: Past Medical History:  Diagnosis Date   Allergy    Arthritis    Baker's cyst    Left knee   Breast cancer (HCC)    History of radiation therapy    Left breast- 12/05/22-01/22/23-Dr. Retta Caster   Hx of adenomatous polyp of colon 05/09/2017   Hypertension    Lactose intolerance    Personal history of radiation therapy    Shingles 10/2008   RUE    SURGICAL HISTORY: Past Surgical History:  Procedure Laterality Date   ABDOMINAL HYSTERECTOMY     Dr Thurl Floras for fibroids   BREAST BIOPSY Left 09/18/2022   US  LT BREAST BX W LOC DEV 1ST LESION IMG BX SPEC US  GUIDE 09/18/2022 [facility]   BREAST BIOPSY  10/10/2022   MM LT RADIOACTIVE SEED LOC MAMMO GUIDE 10/10/2022 [facility]   BREAST BIOPSY Left 10/10/2022   US  LT RADIOACTIVE SEED LOC 10/10/2022 [facility]   BREAST LUMPECTOMY WITH RADIOACTIVE SEED AND SENTINEL LYMPH NODE BIOPSY Left 10/10/2022   Procedure: LEFT BREAST LUMPECTOMY WITH RADIOACTIVE SEED AND AXILLARY SENTINEL LYMPH NODE BIOPSY;  Surgeon: Enid Harry, MD;  Location: [facility] OR;  Service: General;  Laterality: Left;   COLONOSCOPY  12/11/2005   lymphoid aggregate removed (suspected polyp was not), melanosis coli   PILONIDAL CYSTECTOMY     spine cyst   RADIOACTIVE SEED GUIDED AXILLARY SENTINEL LYMPH NODE Left 10/10/2022   Procedure: RADIOACTIVE SEED GUIDED LEFT AXILLARY SENTINEL  LYMPH NODE EXCISION;  Surgeon: Enid Harry, MD;  Location: [facility] OR;  Service: General;  Laterality: Left;    SOCIAL HISTORY: Social History   Socioeconomic History   Marital status: Married    Spouse name: [name]   Number of children: 1   Years of education: Not on file   Highest education level: Some college, no degree  Occupational History   Occupation: retired    Associate Professor: [employer]  Tobacco Use   Smoking status: Former    Current packs/day: 0.00    Types: Cigarettes    Quit date: 06/27/1995    Years since quitting: 28.4   Smokeless tobacco: Never   Tobacco comments:    smoked [age], up to < 1 ppd  Vaping Use   Vaping status: Never Used  Substance and Sexual Activity   Alcohol use: No   Drug use: No   Sexual activity: Yes  Other Topics Concern   Not on file  Social History Narrative   REGULAR EXERCISE   Lives with husband. Has a dog   Social Drivers of Corporate investment banker Strain: Low Risk  (10/09/2023)   Overall Financial Resource Strain (CARDIA)    Difficulty of Paying Living Expenses: Not hard  at all  Food Insecurity: No Food Insecurity (10/09/2023)   Hunger Vital Sign    Worried About Running Out of Food in the Last Year: Never true    Ran Out of Food in the Last Year: Never true  Transportation Needs: No Transportation Needs (10/09/2023)   PRAPARE - Administrator, Civil Service (Medical): No    Lack of Transportation (Non-Medical): No  Physical Activity: Insufficiently Active (10/09/2023)   Exercise Vital Sign    Days of Exercise per Week: 4 days    Minutes of Exercise per Session: 30 min  Stress: No Stress Concern Present (10/09/2023)   [employer] of Occupational Health - Occupational Stress Questionnaire    Feeling of Stress : Not at all  Social Connections: Socially Integrated (10/09/2023)   Social Connection and Isolation Panel [NHANES]    Frequency of Communication with Friends and Family: Twice a week    Frequency of  Social Gatherings with Friends and Family: Twice a week    Attends Religious Services: More than 4 times per year    Active Member of [employer] or Organizations: Yes    Attends Engineer, structural: More than 4 times per year    Marital Status: Married  Catering manager Violence: Not At Risk (04/20/2023)   Humiliation, Afraid, Rape, and Kick questionnaire    Fear of Current or Ex-Partner: No    Emotionally Abused: No    Physically Abused: No    Sexually Abused: No    FAMILY HISTORY: Family History  Problem Relation Age of Onset   Hypertension Mother    Breast cancer Mother 67   Diabetes Father    Stroke Father        MINI STROKES   Stroke Sister 42   Diabetes Sister    Cancer Maternal Uncle        UNKNOWN TYPE   Colon cancer Neg Hx    Esophageal cancer Neg Hx    Pancreatic cancer Neg Hx    Rectal cancer Neg Hx    Stomach cancer Neg Hx    Hyperparathyroidism Neg Hx     Review of Systems  Constitutional:  Negative for appetite change, chills, fatigue, fever and unexpected weight change.  HENT:   Negative for hearing loss, lump/mass and trouble swallowing.   Eyes:  Negative for eye problems and icterus.  Respiratory:  Negative for chest tightness, cough and shortness of breath.   Cardiovascular:  Negative for chest pain, leg swelling and palpitations.  Gastrointestinal:  Negative for abdominal distention, abdominal pain, constipation, diarrhea, nausea and vomiting.  Endocrine: Negative for hot flashes.  Genitourinary:  Negative for difficulty urinating.   Musculoskeletal:  Negative for arthralgias.  Skin:  Negative for itching and rash.  Neurological:  Negative for dizziness, extremity weakness, headaches and numbness.  Hematological:  Negative for adenopathy. Does not bruise/bleed easily.  Psychiatric/Behavioral:  Negative for depression. The patient is not nervous/anxious.       PHYSICAL EXAMINATION    Vitals:   11/22/23 1119 11/22/23 1126  BP: (!) 168/90  (!) 158/90  Pulse: 65   Resp: 19   Temp: 98.1 F (36.7 C)   SpO2: 98%     Physical Exam Constitutional:      General: She is not in acute distress.    Appearance: Normal appearance. She is not toxic-appearing.  HENT:     Head: Normocephalic and atraumatic.     Mouth/Throat:     Mouth: Mucous membranes are moist.  Pharynx: Oropharynx is clear. No oropharyngeal exudate or posterior oropharyngeal erythema.  Eyes:     General: No scleral icterus. Cardiovascular:     Rate and Rhythm: Normal rate and regular rhythm.     Pulses: Normal pulses.     Heart sounds: Normal heart sounds.  Pulmonary:     Effort: Pulmonary effort is normal.     Breath sounds: Normal breath sounds.  Chest:     Comments: Left breast s/p lumpectomy and radiation, no sign of local recurrence, right breast benign Abdominal:     General: Abdomen is flat. Bowel sounds are normal. There is no distension.     Palpations: Abdomen is soft.     Tenderness: There is no abdominal tenderness.  Musculoskeletal:        General: No swelling.     Cervical back: Neck supple.  Lymphadenopathy:     Cervical: No cervical adenopathy.     Upper Body:     Right upper body: No supraclavicular or axillary adenopathy.     Left upper body: No supraclavicular or axillary adenopathy.  Skin:    General: Skin is warm and dry.     Findings: No rash.  Neurological:     General: No focal deficit present.     Mental Status: She is alert.  Psychiatric:        Mood and Affect: Mood normal.        Behavior: Behavior normal.     LABORATORY DATA:  CBC    Component Value Date/Time   WBC 5.0 10/10/2023 0939   RBC 4.73 10/10/2023 0939   HGB 13.4 10/10/2023 0939   HGB 13.4 09/27/2022 0818   HCT 41.5 10/10/2023 0939   PLT 313.0 10/10/2023 0939   PLT 369 09/27/2022 0818   MCV 87.6 10/10/2023 0939   MCH 28.0 09/27/2022 0818   MCHC 32.4 10/10/2023 0939   RDW 13.8 10/10/2023 0939   LYMPHSABS 1.2 10/10/2023 0939   MONOABS 0.3  10/10/2023 0939   EOSABS 0.1 10/10/2023 0939   BASOSABS 0.0 10/10/2023 0939    CMP     Component Value Date/Time   NA 139 10/10/2023 0939   K 3.8 10/10/2023 0939   CL 106 10/10/2023 0939   CO2 26 10/10/2023 0939   GLUCOSE 100 (H) 10/10/2023 0939   BUN 18 10/10/2023 0939   CREATININE 0.84 10/10/2023 0939   CREATININE 0.90 09/27/2022 0818   CALCIUM 11.1 (H) 10/10/2023 0939   PROT 7.0 10/10/2023 0939   ALBUMIN 4.3 10/10/2023 0939   AST 19 10/10/2023 0939   AST 13 (L) 09/27/2022 0818   ALT 14 10/10/2023 0939   ALT 11 09/27/2022 0818   ALKPHOS 102 10/10/2023 0939   BILITOT 0.4 10/10/2023 0939   BILITOT 0.3 09/27/2022 0818   GFRNONAA >60 09/27/2022 0818     ASSESSMENT and THERAPY PLAN:   No problem-specific Assessment & Plan notes found for this encounter.     All questions were answered. The patient knows to call the clinic with any problems, questions or concerns. We can certainly see the patient much sooner if necessary.  Total encounter time:*** minutes*in face-to-face visit time, chart review, lab review, care coordination, order entry, and documentation of the encounter time.    Alwin Baars, NP 11/22/23 11:26 AM Medical Oncology and Hematology [facility] [address] [place], [place] [address] Tel. [phone]    Fax. [phone]  *Total Encounter Time as defined by the Centers for Medicare and Medicaid Services includes,  in addition to the face-to-face time of a patient visit (documented in the note above) non-face-to-face time: obtaining and reviewing outside history, ordering and reviewing medications, tests or procedures, care coordination (communications with other health care professionals or caregivers) and documentation in the medical record.

## 2023-11-22 NOTE — Assessment & Plan Note (Signed)
 Assessment and Plan Assessment & Plan

## 2023-11-23 DIAGNOSIS — J209 Acute bronchitis, unspecified: Secondary | ICD-10-CM | POA: Diagnosis not present

## 2023-11-23 DIAGNOSIS — Z6827 Body mass index (BMI) 27.0-27.9, adult: Secondary | ICD-10-CM | POA: Diagnosis not present

## 2023-11-28 ENCOUNTER — Ambulatory Visit: Admission: EM | Admit: 2023-11-28 | Discharge: 2023-11-28 | Disposition: A | Discharge status: Home / Self Care

## 2023-11-28 ENCOUNTER — Other Ambulatory Visit: Payer: Self-pay

## 2023-11-28 DIAGNOSIS — J189 Pneumonia, unspecified organism: Secondary | ICD-10-CM | POA: Diagnosis not present

## 2023-11-28 DIAGNOSIS — R053 Chronic cough: Secondary | ICD-10-CM | POA: Diagnosis not present

## 2023-11-28 DIAGNOSIS — J209 Acute bronchitis, unspecified: Secondary | ICD-10-CM

## 2023-11-28 MED ORDER — AZITHROMYCIN 250 MG PO TABS: ORAL_TABLET | ORAL | 0 refills | Status: DC

## 2023-11-28 MED ORDER — AEROCHAMBER PLUS FLO-VU MEDIUM MISC
1.0000 | Freq: Once | Status: AC
  Administered 2023-11-28: 1

## 2023-11-28 MED ORDER — TRIAMCINOLONE ACETONIDE 40 MG/ML IJ SUSP
40.0000 mg | Freq: Once | INTRAMUSCULAR | Status: AC
  Administered 2023-11-28: 40 mg via INTRAMUSCULAR

## 2023-11-28 MED ORDER — ALBUTEROL SULFATE (2.5 MG/3ML) 0.083% IN NEBU
2.5000 mg | INHALATION_SOLUTION | Freq: Once | RESPIRATORY_TRACT | Status: AC
Start: 2023-11-28 — End: 2023-11-28
  Administered 2023-11-28: 2.5 mg via RESPIRATORY_TRACT

## 2023-11-28 MED ORDER — ALBUTEROL SULFATE HFA 108 (90 BASE) MCG/ACT IN AERS: 1.0000 | INHALATION_SPRAY | Freq: Four times a day (QID) | RESPIRATORY_TRACT | 0 refills | Status: AC | PRN

## 2023-11-28 NOTE — ED Triage Notes (Signed)
 Pt presents with complaints of cough, nasal congestion, and wheezing x over one month. Pt states she was seen here on 5/19 and had a chest x-ray. Pt did go to another urgent care on 5/30, received antibiotics for ten days, currently on day six with no relief. Treating as pneumonia. Pt currently denies pain.

## 2023-11-28 NOTE — Discharge Instructions (Addendum)
 He was seen today for concerns of continued coughing and wheezing.  Since you are currently taking amoxicillin  twice per day for suspected pneumonia I am adding a medication called azithromycin  to this.  This is also known as a Z-Pak.  Please take this medication according to the directions on the box.  Please make sure that you take it starting as soon as you pick it up from the pharmacy and finish the entire course unless you develop an allergic reaction or if you are instructed to stop by medical provider. To help with your breathing we have provided you with a Kenalog  40 mg steroid injection.  This medication typically helps improve breathing and reduces pulmonary inflammation. To assist with your breathing I have also sent in an albuterol  inhaler.  This medication helps open up the airways and allow you to breathe a little bit easier.  You can use this especially when you are having coughing spells or chest tightness. If you feel like your symptoms are not improving over the next 5 to 7 days or seem like they are getting worse I recommend following up with your primary care provider or returning to urgent care for further evaluation.  If at any point you start to develop severe difficulty breathing, chest pain or tightness, fever or chills that are not responding to medications, passing out or confusion please go to the emergency room as these could be signs of a medical emergency.

## 2023-11-28 NOTE — ED Provider Notes (Signed)
 Angie Garcia    CSN: [id number] Arrival date & time: 11/28/23  1133      History   Chief Complaint Chief Complaint  Patient presents with   Cough    HPI Angie Garcia is a 73 y.o. female.   HPI  Pt states she feels like her symptoms are getting worse She reports she still has 4 days left of abx (amoxicillin )  She states she has productive cough with clear phlegm and wheezing.She states when her symptoms first started it was brown  She is not taking OTC meds for symptomatic relief due to concerns for reactions to current medications She denies previous hx of asthma, COPD or other breathing issues She does admit to having bronchitis in the past   Past Medical History:  Diagnosis Date   Allergy    Arthritis    Baker's cyst    Left knee   Breast cancer (HCC)    History of radiation therapy    Left breast- 12/05/22-01/22/23-Dr. Retta Caster   Hx of adenomatous polyp of colon 05/09/2017   Hypertension    Lactose intolerance    Personal history of radiation therapy    Shingles 10/2008   RUE    Patient Active Problem List   Diagnosis Date Noted   Skin lump of leg, right 10/10/2023   Malignant neoplasm of upper-inner quadrant of left breast in female, estrogen receptor positive (HCC) 09/25/2022   Osteopenia 04/10/2021   Hyperparathyroidism, primary (HCC) 03/14/2018   Multinodular goiter 03/14/2018   Hx of adenomatous polyp of colon 05/09/2017   Cyst of left kidney 10/21/2015   Prediabetes 08/19/2015   Hyperlipidemia 06/02/2014   DJD (degenerative joint disease) of knee 04/01/2013   Hemorrhoids, internal, with bleeding 07/04/2011   LACTOSE INTOLERANCE 11/18/2009   Allergic rhinitis 11/18/2009   History of herpes zoster 10/28/2008   Essential hypertension 01/24/2008    Past Surgical History:  Procedure Laterality Date   ABDOMINAL HYSTERECTOMY     Dr Thurl Floras for fibroids   BREAST BIOPSY Left 09/18/2022   US  LT BREAST BX W LOC DEV 1ST LESION IMG BX SPEC  US  GUIDE 09/18/2022 [facility]   BREAST BIOPSY  10/10/2022   MM LT RADIOACTIVE SEED LOC MAMMO GUIDE 10/10/2022 [facility]   BREAST BIOPSY Left 10/10/2022   US  LT RADIOACTIVE SEED LOC 10/10/2022 [facility]   BREAST LUMPECTOMY WITH RADIOACTIVE SEED AND SENTINEL LYMPH NODE BIOPSY Left 10/10/2022   Procedure: LEFT BREAST LUMPECTOMY WITH RADIOACTIVE SEED AND AXILLARY SENTINEL LYMPH NODE BIOPSY;  Surgeon: Enid Harry, MD;  Location: [facility] OR;  Service: General;  Laterality: Left;   COLONOSCOPY  12/11/2005   lymphoid aggregate removed (suspected polyp was not), melanosis coli   PILONIDAL CYSTECTOMY     spine cyst   RADIOACTIVE SEED GUIDED AXILLARY SENTINEL LYMPH NODE Left 10/10/2022   Procedure: RADIOACTIVE SEED GUIDED LEFT AXILLARY SENTINEL LYMPH NODE EXCISION;  Surgeon: Enid Harry, MD;  Location: [facility] OR;  Service: General;  Laterality: Left;    OB History   No obstetric history on file.      Home Medications    Prior to Admission medications   Medication Sig Start Date End Date Taking? Authorizing Provider  albuterol  (VENTOLIN  HFA) 108 (90 Base) MCG/ACT inhaler Inhale 1-2 puffs into the lungs every 6 (six) hours as needed for wheezing or shortness of breath. 11/28/23  Yes Emersen Carroll E, PA-C  amoxicillin  (AMOXIL ) 500 MG tablet Take 500 mg by mouth 2 (two) times daily. 11/23/23  Yes [provider]  azithromycin  (ZITHROMAX ) 250 MG tablet Take 500mg  PO daily x1d and then 250mg  daily x4 days 11/28/23  Yes Shawan Corella E, PA-C  anastrozole  (ARIMIDEX ) 1 MG tablet Take 1 tablet (1 mg total) by mouth daily. 01/18/23   Iruku, Praveena, MD  benzonatate  (TESSALON ) 100 MG capsule Take 1 capsule (100 mg total) by mouth every 8 (eight) hours. 11/12/23   Starlene Eaton, FNP  cetirizine  (ZYRTEC ) 10 MG tablet Take 10 mg by mouth daily as needed for allergies.    [provider]  diltiazem  (CARDIZEM  CD) 120 MG 24 hr capsule TAKE ONE CAPSULE BY MOUTH ONE TIME DAILY  01/09/23   Colene Dauphin, MD  lisinopril  (ZESTRIL ) 20 MG tablet TAKE ONE TABLET BY MOUTH ONE TIME DAILY 01/09/23   Colene Dauphin, MD  loratadine  (CLARITIN ) 10 MG tablet Take 1 tablet (10 mg total) by mouth daily. 11/12/23   Starlene Eaton, FNP  Multiple Vitamin (MULTIVITAMIN) tablet Take 1 tablet by mouth daily.    [provider]  TURMERIC PO Take 1 capsule by mouth daily.    [provider]    Family History Family History  Problem Relation Age of Onset   Hypertension Mother    Breast cancer Mother 82   Diabetes Father    Stroke Father        MINI STROKES   Stroke Sister 30   Diabetes Sister    Cancer Maternal Uncle        UNKNOWN TYPE   Colon cancer Neg Hx    Esophageal cancer Neg Hx    Pancreatic cancer Neg Hx    Rectal cancer Neg Hx    Stomach cancer Neg Hx    Hyperparathyroidism Neg Hx     Social History Social History   Tobacco Use   Smoking status: Former    Current packs/day: 0.00    Types: Cigarettes    Quit date: 06/27/1995    Years since quitting: 28.4   Smokeless tobacco: Never   Tobacco comments:    smoked [age], up to < 1 ppd  Vaping Use   Vaping status: Never Used  Substance Use Topics   Alcohol use: No   Drug use: No     Allergies   Sulfamethoxazole-trimethoprim   Review of Systems Review of Systems  Constitutional:  Positive for chills. Negative for fever.  HENT:  Positive for sinus pressure and sinus pain. Negative for congestion, ear pain and sore throat.   Respiratory:  Positive for cough, shortness of breath and wheezing.   Gastrointestinal:  Negative for diarrhea, nausea and vomiting.  Musculoskeletal:  Negative for myalgias.     Physical Exam Triage Vital Signs ED Triage Vitals  Encounter Vitals Group     BP 11/28/23 1233 (!) 150/81     Systolic BP Percentile --      Diastolic BP Percentile --      Pulse Rate 11/28/23 1233 61     Resp 11/28/23 1244 18     Temp 11/28/23 1233 98 F (36.7 C)     Temp  Source 11/28/23 1233 Oral     SpO2 11/28/23 1233 96 %     Weight 11/28/23 1233 165 lb (74.8 kg)     Height 11/28/23 1233 5\' 5"  (1.651 m)     Head Circumference --      Peak Flow --      Pain Score 11/28/23 1244 0     Pain Loc --  Pain Education --      Exclude from Growth Chart --    No data found.  Updated Vital Signs BP (!) 150/81 (BP Location: Left Arm) Comment: Originally 170/83  Pulse 61   Temp 98 F (36.7 C) (Oral)   Resp 18   Ht 5\' 5"  (1.651 m)   Wt 165 lb (74.8 kg)   SpO2 96%   BMI 27.46 kg/m   Visual Acuity Right Eye Distance:   Left Eye Distance:   Bilateral Distance:    Right Eye Near:   Left Eye Near:    Bilateral Near:     Physical Exam Vitals reviewed.  Constitutional:      General: She is awake.     Appearance: Normal appearance. She is well-developed and well-groomed.  HENT:     Head: Normocephalic and atraumatic.     Right Ear: Hearing, tympanic membrane and ear canal normal.     Left Ear: Hearing, tympanic membrane and ear canal normal.     Mouth/Throat:     Lips: Pink.     Mouth: Mucous membranes are moist.     Pharynx: Oropharynx is clear. Uvula midline. No pharyngeal swelling, oropharyngeal exudate, posterior oropharyngeal erythema, uvula swelling or postnasal drip.  Cardiovascular:     Rate and Rhythm: Normal rate and regular rhythm.     Pulses: Normal pulses.          Radial pulses are 2+ on the right side and 2+ on the left side.     Heart sounds: Normal heart sounds. No murmur heard.    No friction rub. No gallop.  Pulmonary:     Effort: Pulmonary effort is normal.     Breath sounds: No decreased air movement. Wheezing present. No decreased breath sounds, rhonchi or rales.  Musculoskeletal:     Cervical back: Normal range of motion and neck supple.  Lymphadenopathy:     Head:     Right side of head: No submental, submandibular or preauricular adenopathy.     Left side of head: No submental, submandibular or preauricular  adenopathy.     Cervical:     Right cervical: No superficial cervical adenopathy.    Left cervical: No superficial cervical adenopathy.     Upper Body:     Right upper body: No supraclavicular adenopathy.     Left upper body: No supraclavicular adenopathy.  Skin:    General: Skin is warm and dry.  Neurological:     General: No focal deficit present.     Mental Status: She is alert and oriented to person, place, and time.  Psychiatric:        Mood and Affect: Mood normal.        Behavior: Behavior normal. Behavior is cooperative.        Thought Content: Thought content normal.      Garcia Treatments / Results  Labs (all labs ordered are listed, but only abnormal results are displayed) Labs Reviewed - No data to display  EKG   Radiology No results found.  Procedures Procedures (including critical care time)  Medications Ordered in Garcia Medications  albuterol  (PROVENTIL ) (2.5 MG/3ML) 0.083% nebulizer solution 2.5 mg (2.5 mg Nebulization Given 11/28/23 1354)  AeroChamber Plus Flo-Vu Medium MISC 1 each (1 each Other Given 11/28/23 1428)  triamcinolone  acetonide (KENALOG -40) injection 40 mg (40 mg Intramuscular Given 11/28/23 1428)    Initial Impression / Assessment and Plan / Garcia Course  I have reviewed the triage vital signs and the  nursing notes.  Pertinent labs & imaging results that were available during my care of the patient were reviewed by me and considered in my medical decision making (see chart for details).     Patient was seen on 11/12/2023 at this urgent care for similar symptoms.  She was diagnosed with viral URI with cough.  Chest x-ray was negative for signs of acute cardiopulmonary illness.  She was sent home with prescriptions for loratadine  and benzonatate  to assist with symptomatic management.  Final Clinical Impressions(s) / Garcia Diagnoses   Final diagnoses:  Acute bronchitis, unspecified organism  Community acquired pneumonia, unspecified laterality   Patient  presents today with concerns of persistent coughing, wheezing.  She reports this has been ongoing and seems to be worsening for the past few weeks.  She was previously seen at this urgent care on 11/12/2023 and had negative chest x-ray.  He was subsequently seen at a different location and treated for pneumonia with amoxicillin .  She is currently on day 6 of 10 and reports minimal improvement in her coughing and symptoms.  Physical exam is notable for mild wheezing particularly in the mid right lung field.  Albuterol  nebulizer treatment was administered which did provide some improvement in airflow and wheezing.  At this time I suspect potential bronchitis but I cannot fully rule out pneumonia at this time.  Will go ahead and add azithromycin  to regimen and provide Kenalog  40 mg injection here in clinic today.  Will send patient home with albuterol  rescue inhaler and a spacer to assist with administration.  Recommend completing the course of amoxicillin  for resolution of symptoms.  ED and return precautions were reviewed and provided in after visit summary.  Follow-up as needed.    Discharge Instructions      He was seen today for concerns of continued coughing and wheezing.  Since you are currently taking amoxicillin  twice per day for suspected pneumonia I am adding a medication called azithromycin  to this.  This is also known as a Z-Pak.  Please take this medication according to the directions on the box.  Please make sure that you take it starting as soon as you pick it up from the pharmacy and finish the entire course unless you develop an allergic reaction or if you are instructed to stop by medical provider. To help with your breathing we have provided you with a Kenalog  40 mg steroid injection.  This medication typically helps improve breathing and reduces pulmonary inflammation. To assist with your breathing I have also sent in an albuterol  inhaler.  This medication helps open up the airways and  allow you to breathe a little bit easier.  You can use this especially when you are having coughing spells or chest tightness. If you feel like your symptoms are not improving over the next 5 to 7 days or seem like they are getting worse I recommend following up with your primary care provider or returning to urgent care for further evaluation.  If at any point you start to develop severe difficulty breathing, chest pain or tightness, fever or chills that are not responding to medications, passing out or confusion please go to the emergency room as these could be signs of a medical emergency.   ED Prescriptions     Medication Sig Dispense Auth. Provider   azithromycin  (ZITHROMAX ) 250 MG tablet Take 500mg  PO daily x1d and then 250mg  daily x4 days 6 each Samreet Edenfield E, PA-C   albuterol  (VENTOLIN  HFA) 108 (90 Base)  MCG/ACT inhaler Inhale 1-2 puffs into the lungs every 6 (six) hours as needed for wheezing or shortness of breath. 6.7 g Aliha Diedrich E, PA-C      PDMP not reviewed this encounter.   Jerona Mooring, PA-C 11/28/23 1611

## 2023-12-19 DIAGNOSIS — H524 Presbyopia: Secondary | ICD-10-CM | POA: Diagnosis not present

## 2023-12-19 DIAGNOSIS — H5203 Hypermetropia, bilateral: Secondary | ICD-10-CM | POA: Diagnosis not present

## 2023-12-19 DIAGNOSIS — H52209 Unspecified astigmatism, unspecified eye: Secondary | ICD-10-CM | POA: Diagnosis not present

## 2024-01-01 ENCOUNTER — Other Ambulatory Visit: Payer: Self-pay | Admitting: Hematology and Oncology

## 2024-01-03 ENCOUNTER — Ambulatory Visit (HOSPITAL_BASED_OUTPATIENT_CLINIC_OR_DEPARTMENT_OTHER)
Admission: RE | Admit: 2024-01-03 | Discharge: 2024-01-03 | Disposition: A | Discharge status: Home / Self Care | Source: Ambulatory Visit | Attending: Internal Medicine | Admitting: Internal Medicine

## 2024-01-03 ENCOUNTER — Other Ambulatory Visit: Payer: Self-pay | Admitting: Internal Medicine

## 2024-01-03 DIAGNOSIS — Z780 Asymptomatic menopausal state: Secondary | ICD-10-CM | POA: Insufficient documentation

## 2024-01-03 DIAGNOSIS — M8589 Other specified disorders of bone density and structure, multiple sites: Secondary | ICD-10-CM | POA: Diagnosis not present

## 2024-01-04 ENCOUNTER — Other Ambulatory Visit: Payer: Medicare HMO

## 2024-01-05 ENCOUNTER — Ambulatory Visit: Payer: Self-pay | Admitting: Internal Medicine

## 2024-01-05 DIAGNOSIS — M8589 Other specified disorders of bone density and structure, multiple sites: Secondary | ICD-10-CM

## 2024-01-11 DIAGNOSIS — I10 Essential (primary) hypertension: Secondary | ICD-10-CM | POA: Diagnosis not present

## 2024-01-11 DIAGNOSIS — H2513 Age-related nuclear cataract, bilateral: Secondary | ICD-10-CM | POA: Diagnosis not present

## 2024-01-11 DIAGNOSIS — H401133 Primary open-angle glaucoma, bilateral, severe stage: Secondary | ICD-10-CM | POA: Diagnosis not present

## 2024-02-01 DIAGNOSIS — I10 Essential (primary) hypertension: Secondary | ICD-10-CM | POA: Diagnosis not present

## 2024-02-01 DIAGNOSIS — H401133 Primary open-angle glaucoma, bilateral, severe stage: Secondary | ICD-10-CM | POA: Diagnosis not present

## 2024-02-01 DIAGNOSIS — H2513 Age-related nuclear cataract, bilateral: Secondary | ICD-10-CM | POA: Diagnosis not present

## 2024-02-06 DIAGNOSIS — H6122 Impacted cerumen, left ear: Secondary | ICD-10-CM | POA: Diagnosis not present

## 2024-02-06 DIAGNOSIS — K219 Gastro-esophageal reflux disease without esophagitis: Secondary | ICD-10-CM | POA: Diagnosis not present

## 2024-02-08 ENCOUNTER — Ambulatory Visit: Attending: Hematology and Oncology

## 2024-02-08 VITALS — Wt 165.5 lb

## 2024-02-08 DIAGNOSIS — Z483 Aftercare following surgery for neoplasm: Secondary | ICD-10-CM | POA: Insufficient documentation

## 2024-02-08 NOTE — Therapy (Signed)
 OUTPATIENT PHYSICAL THERAPY SOZO SCREENING NOTE   Patient Name: Angie Garcia MRN: [id number] DOB:04-Oct-1950, 73 y.o., female Today's Date: 02/08/2024  PCP: [name], MD REFERRING PROVIDER: [name], MD   PT End of Session - 02/08/24 1056     Visit Number 2   # unchanged due to screen only   PT Start Time 1054    PT Stop Time 1058    PT Time Calculation (min) 4 min    Activity Tolerance Patient tolerated treatment well    Behavior During Therapy WFL for tasks assessed/performed          Past Medical History:  Diagnosis Date   Allergy    Arthritis    Baker's cyst    Left knee   Breast cancer (HCC)    History of radiation therapy    Left breast- 12/05/22-01/22/23-Dr. Lynwood Nasuti   Hx of adenomatous polyp of colon 05/09/2017   Hypertension    Lactose intolerance    Personal history of radiation therapy    Shingles 10/2008   RUE   Past Surgical History:  Procedure Laterality Date   ABDOMINAL HYSTERECTOMY     Dr Elsa for fibroids   BREAST BIOPSY Left 09/18/2022   US  LT BREAST BX W LOC DEV 1ST LESION IMG BX SPEC US  GUIDE 09/18/2022 [facility]   BREAST BIOPSY  10/10/2022   MM LT RADIOACTIVE SEED LOC MAMMO GUIDE 10/10/2022 [facility]   BREAST BIOPSY Left 10/10/2022   US  LT RADIOACTIVE SEED LOC 10/10/2022 [facility]   BREAST LUMPECTOMY WITH RADIOACTIVE SEED AND SENTINEL LYMPH NODE BIOPSY Left 10/10/2022   Procedure: LEFT BREAST LUMPECTOMY WITH RADIOACTIVE SEED AND AXILLARY SENTINEL LYMPH NODE BIOPSY;  Surgeon: Ebbie Cough, MD;  Location: [facility] OR;  Service: General;  Laterality: Left;   COLONOSCOPY  12/11/2005   lymphoid aggregate removed (suspected polyp was not), melanosis coli   PILONIDAL CYSTECTOMY     spine cyst   RADIOACTIVE SEED GUIDED AXILLARY SENTINEL LYMPH NODE Left 10/10/2022   Procedure: RADIOACTIVE SEED GUIDED LEFT AXILLARY SENTINEL LYMPH NODE EXCISION;  Surgeon: Ebbie Cough, MD;  Location: [facility];  Service: General;   Laterality: Left;   Patient Active Problem List   Diagnosis Date Noted   Skin lump of leg, right 10/10/2023   Malignant neoplasm of upper-inner quadrant of left breast in female, estrogen receptor positive (HCC) 09/25/2022   Osteopenia 04/10/2021   Hyperparathyroidism, primary (HCC) 03/14/2018   Multinodular goiter 03/14/2018   Hx of adenomatous polyp of colon 05/09/2017   Cyst of left kidney 10/21/2015   Prediabetes 08/19/2015   Hyperlipidemia 06/02/2014   DJD (degenerative joint disease) of knee 04/01/2013   Hemorrhoids, internal, with bleeding 07/04/2011   LACTOSE INTOLERANCE 11/18/2009   Allergic rhinitis 11/18/2009   History of herpes zoster 10/28/2008   Essential hypertension 01/24/2008    REFERRING DIAG: left breast cancer at risk for lymphedema  THERAPY DIAG:  Aftercare following surgery for neoplasm  PERTINENT HISTORY: Lt lumpectomy due to [facility] ER/PR positive breast cancer on 10/10/22. 1/6 positive nodes removed. Oncotype 17 and will not be getting chemotherapy  PRECAUTIONS: left UE Lymphedema risk, None  SUBJECTIVE: Pt returns for her 3 month L-Dex screen.   PAIN:  Are you having pain? No  SOZO SCREENING: Patient was assessed today using the SOZO machine to determine the lymphedema index score. This was compared to her baseline score. It was determined that she is within the recommended range when compared to her baseline and no further  action is needed at this time. She will continue SOZO screenings. These are done every 3 months for 2 years post operatively followed by every 6 months for 2 years, and then annually. Answered pts questions about SOZO.    L-DEX FLOWSHEETS - 02/08/24 1000       L-DEX LYMPHEDEMA SCREENING   Measurement Type Unilateral    L-DEX MEASUREMENT EXTREMITY Upper Extremity    POSITION  Standing    DOMINANT SIDE Right    At Risk Side Left    BASELINE SCORE (UNILATERAL) 0.9    L-DEX SCORE (UNILATERAL) 1.1    VALUE CHANGE (UNILAT) 0.2           P: Cont every 3 month SOZO until April 2026, then transition to every 6 months x 2 years.    Aden Berwyn Caldron, PTA 02/08/2024, 10:59 AM

## 2024-02-15 IMAGING — MG MM DIGITAL SCREENING BILAT W/ TOMO AND CAD
8 series · 9 of 24 positions shown · non-contrast
Comparison: Previous exam(s).

CLINICAL DATA: Screening.

EXAM:
DIGITAL SCREENING BILATERAL MAMMOGRAM WITH TOMOSYNTHESIS AND CAD
TECHNIQUE: Bilateral screening digital craniocaudal and mediolateral oblique
mammograms were obtained. Bilateral screening digital breast
tomosynthesis was performed. The images were evaluated with
computer-aided detection.

[R CC synth-2D]
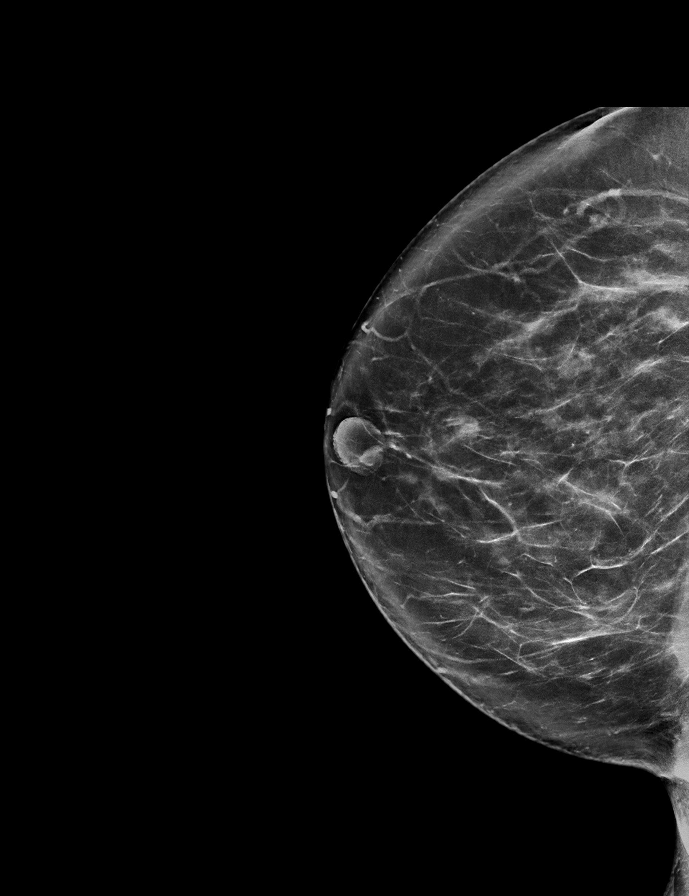

[L CC synth-2D]
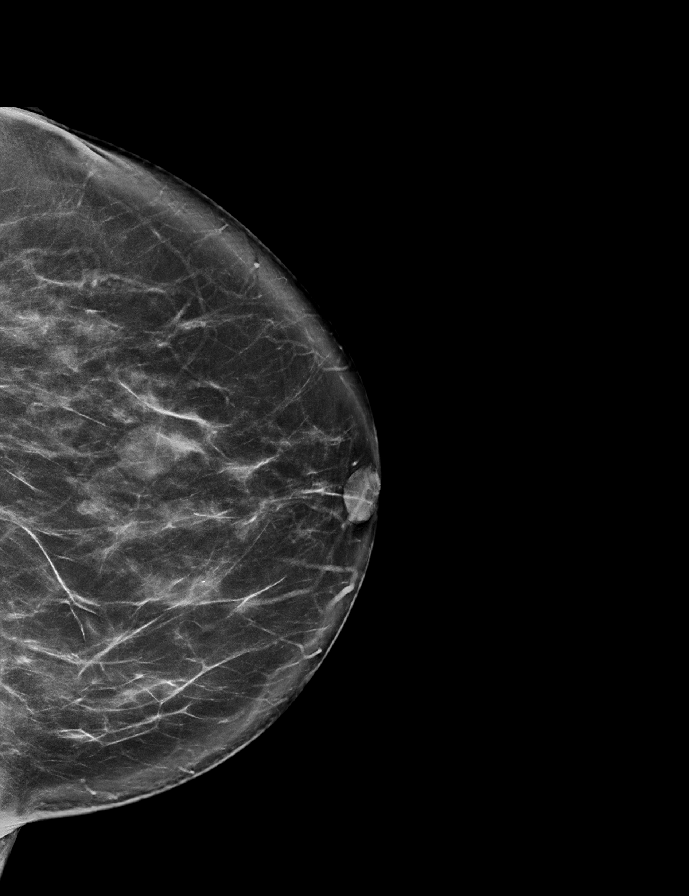

[L MLO synth-2D]
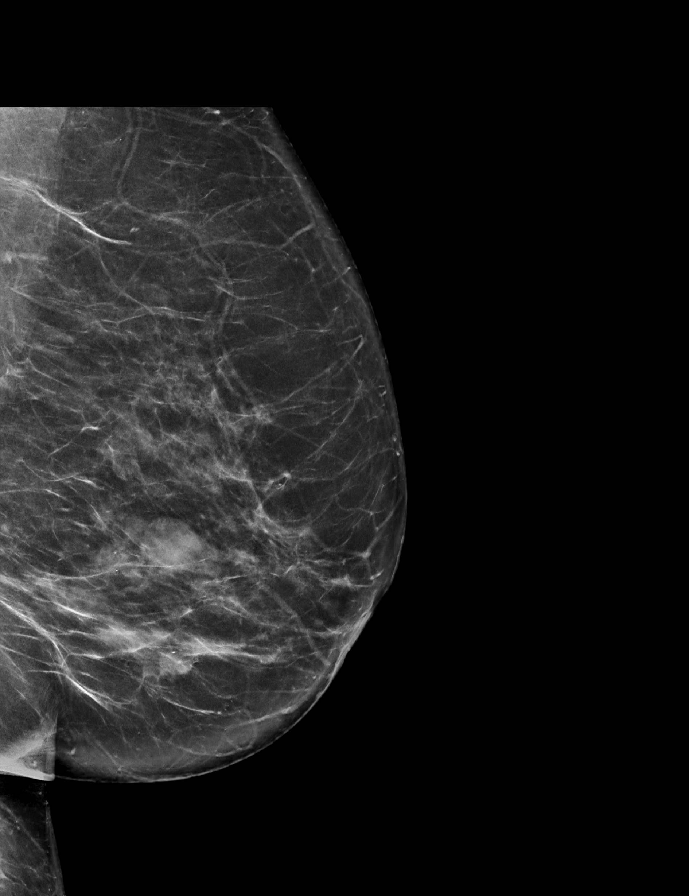

[R MLO synth-2D]
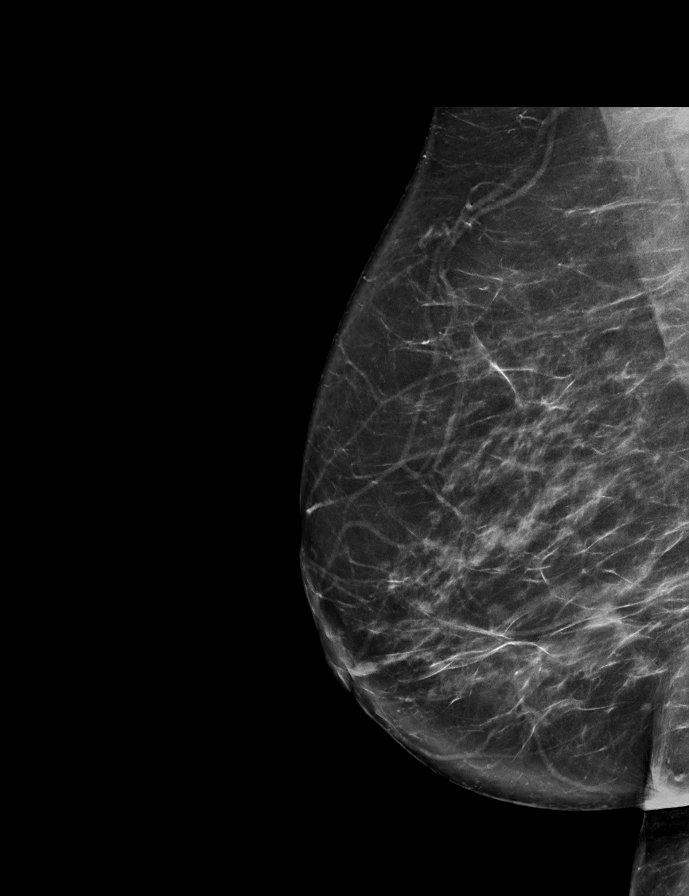

[R MLO tomo · 2 of 78 frames shown]
[frame 26/78]
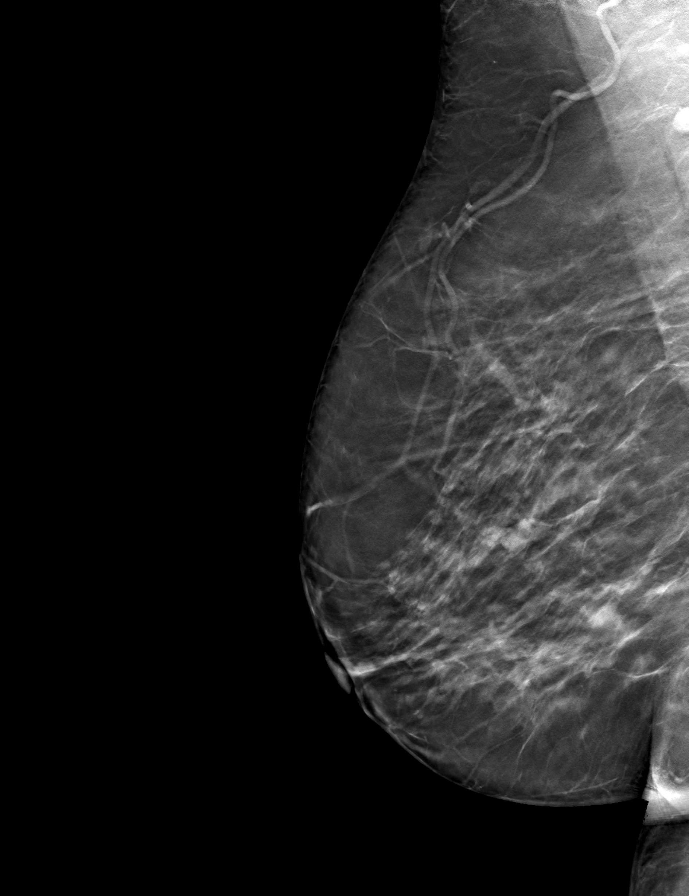
[frame 39/78]
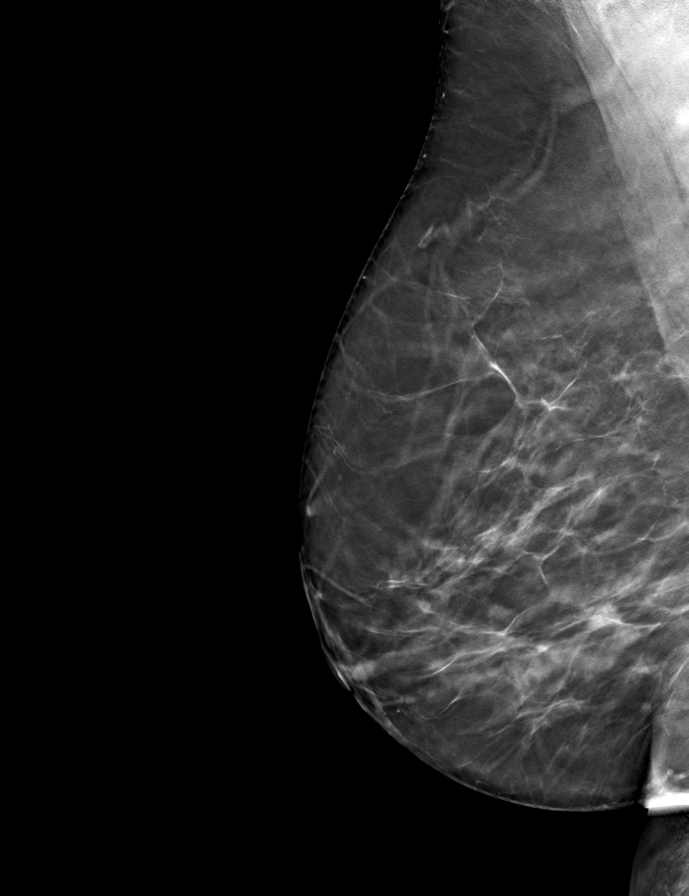

[L MLO tomo · tomo slice 39/77.0]
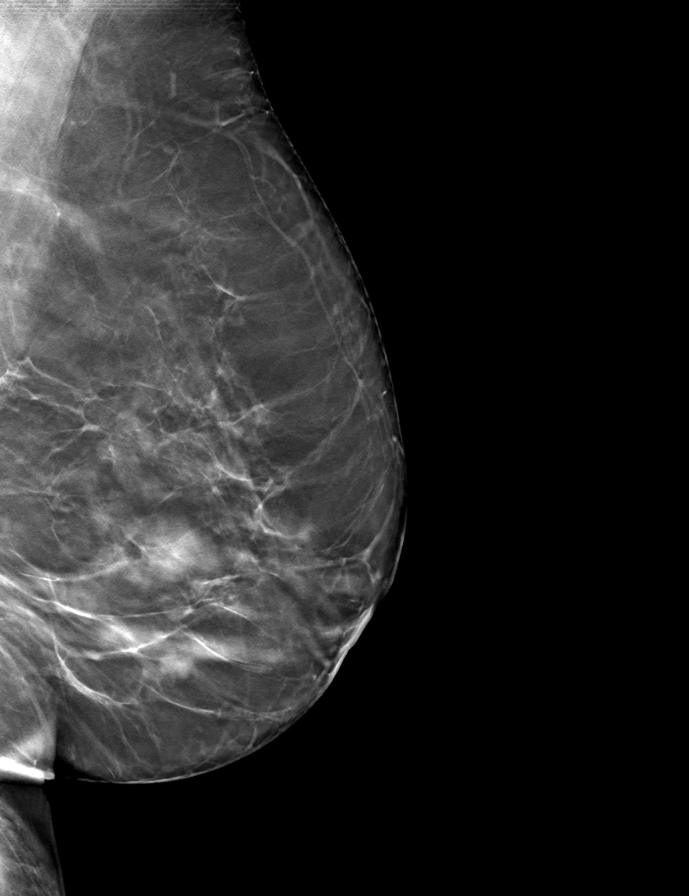

[L CC tomo · tomo slice 43/84.0]
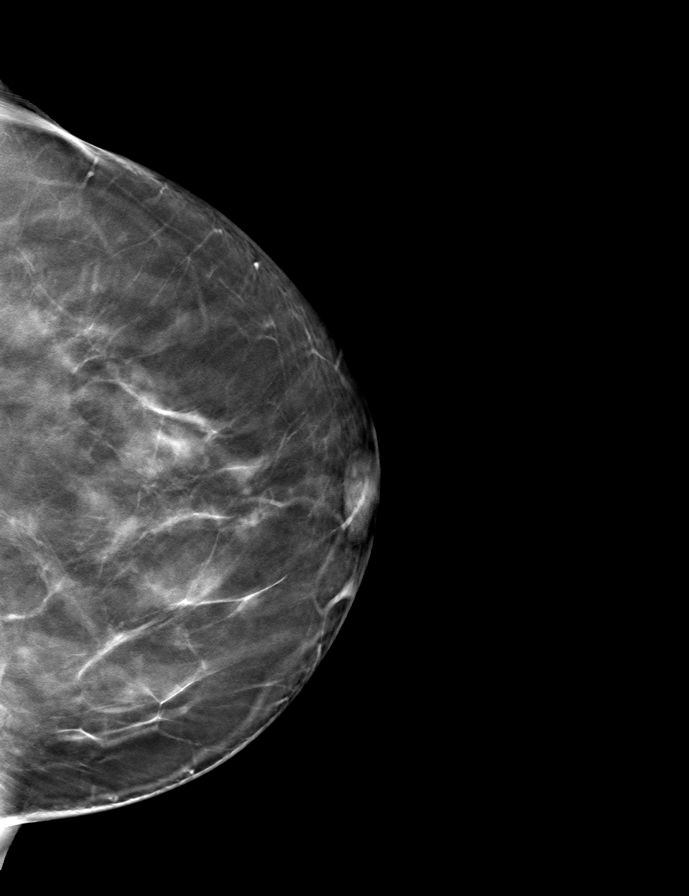

[R CC tomo · tomo slice 43/84.0]
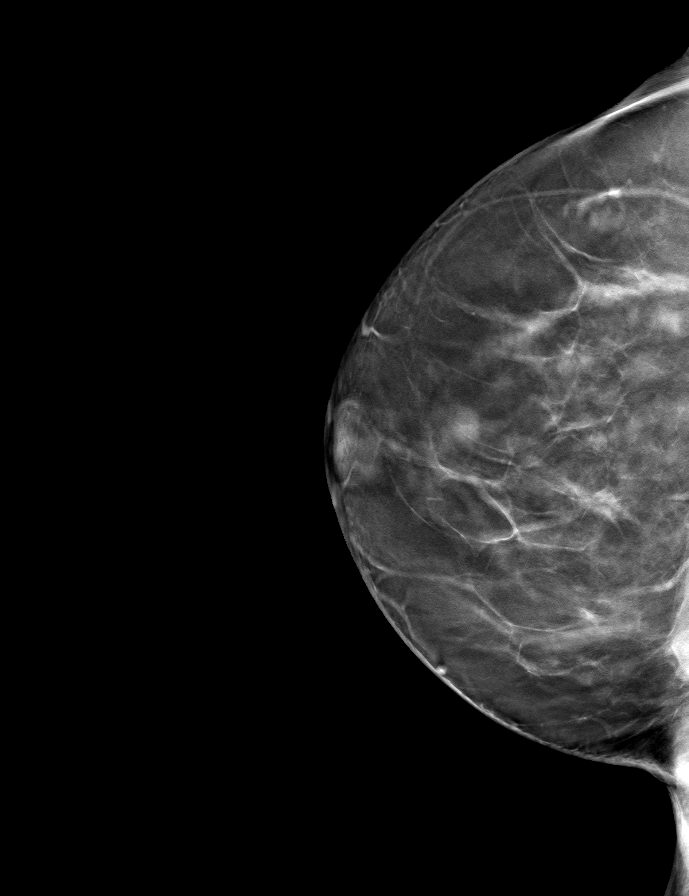

[9 of 24 positions shown; findings below may reference images not displayed]

ACR Breast Density Category c: The breast tissue is heterogeneously
dense, which may obscure small masses.
FINDINGS: There are no findings suspicious for malignancy. Waxing and waning
circumscribed equal density masses are consistent with a changing
cystic pattern.
IMPRESSION: No mammographic evidence of malignancy. A result letter of this
screening mammogram will be mailed directly to the patient.

RECOMMENDATION:
Screening mammogram in one year. (Code:FX-H-Y88)

BI-RADS CATEGORY  2: Benign.

## 2024-03-14 DIAGNOSIS — H2513 Age-related nuclear cataract, bilateral: Secondary | ICD-10-CM | POA: Diagnosis not present

## 2024-03-14 DIAGNOSIS — I10 Essential (primary) hypertension: Secondary | ICD-10-CM | POA: Diagnosis not present

## 2024-03-14 DIAGNOSIS — H401113 Primary open-angle glaucoma, right eye, severe stage: Secondary | ICD-10-CM | POA: Diagnosis not present

## 2024-03-14 DIAGNOSIS — H401133 Primary open-angle glaucoma, bilateral, severe stage: Secondary | ICD-10-CM | POA: Diagnosis not present

## 2024-04-11 ENCOUNTER — Ambulatory Visit: Admitting: Internal Medicine

## 2024-04-11 DIAGNOSIS — H40031 Anatomical narrow angle, right eye: Secondary | ICD-10-CM | POA: Diagnosis not present

## 2024-04-16 NOTE — Progress Notes (Unsigned)
      Subjective:    Patient ID: Angie Garcia, female    DOB: 06/24/1951, 73 y.o.   MRN: [id number]     HPI Samyukta is here for follow up of her chronic medical problems.   Should be on statin Medications and allergies reviewed with patient and updated if appropriate.  Current Outpatient Medications on File Prior to Visit  Medication Sig Dispense Refill   albuterol  (VENTOLIN  HFA) 108 (90 Base) MCG/ACT inhaler Inhale 1-2 puffs into the lungs every 6 (six) hours as needed for wheezing or shortness of breath. 6.7 g 0   amoxicillin  (AMOXIL ) 500 MG tablet Take 500 mg by mouth 2 (two) times daily.     anastrozole  (ARIMIDEX ) 1 MG tablet TAKE ONE TABLET BY MOUTH ONE TIME DAILY 90 tablet 3   azithromycin  (ZITHROMAX ) 250 MG tablet Take 500mg  PO daily x1d and then 250mg  daily x4 days 6 each 0   benzonatate  (TESSALON ) 100 MG capsule Take 1 capsule (100 mg total) by mouth every 8 (eight) hours. 21 capsule 0   cetirizine  (ZYRTEC ) 10 MG tablet Take 10 mg by mouth daily as needed for allergies.     diltiazem  (CARDIZEM  CD) 120 MG 24 hr capsule TAKE ONE CAPSULE BY MOUTH ONE TIME DAILY 90 capsule 3   lisinopril  (ZESTRIL ) 20 MG tablet TAKE ONE TABLET BY MOUTH ONE TIME DAILY 90 tablet 3   loratadine  (CLARITIN ) 10 MG tablet Take 1 tablet (10 mg total) by mouth daily. 30 tablet 0   Multiple Vitamin (MULTIVITAMIN) tablet Take 1 tablet by mouth daily.     TURMERIC PO Take 1 capsule by mouth daily.     No current facility-administered medications on file prior to visit.     Review of Systems     Objective:  There were no vitals filed for this visit. BP Readings from Last 3 Encounters:  11/28/23 (!) 150/81  11/22/23 (!) 158/90  11/12/23 (!) 149/83   Wt Readings from Last 3 Encounters:  02/08/24 165 lb 8 oz (75.1 kg)  11/28/23 165 lb (74.8 kg)  11/22/23 160 lb 3.2 oz (72.7 kg)   There is no height or weight on file to calculate BMI.    Physical Exam     Lab Results  Component Value Date    WBC 5.0 10/10/2023   HGB 13.4 10/10/2023   HCT 41.5 10/10/2023   PLT 313.0 10/10/2023   GLUCOSE 100 (H) 10/10/2023   CHOL 229 (H) 10/10/2023   TRIG 62.0 10/10/2023   HDL 74.10 10/10/2023   LDLDIRECT 120.1 09/09/2012   LDLCALC 142 (H) 10/10/2023   ALT 14 10/10/2023   AST 19 10/10/2023   NA 139 10/10/2023   K 3.8 10/10/2023   CL 106 10/10/2023   CREATININE 0.84 10/10/2023   BUN 18 10/10/2023   CO2 26 10/10/2023   TSH 0.80 10/10/2023   HGBA1C 5.7 10/10/2023     Assessment & Plan:    See Problem List for Assessment and Plan of chronic medical problems.

## 2024-04-16 NOTE — Patient Instructions (Addendum)
      Blood work was ordered.       Medications changes include :   stop lisinopril  20 and start lisinopril -hydrochlorothiazide 20-12.5 mg daily    A referral was ordered Endocrine and someone will call you to schedule an appointment.     Return in about 6 months (around 10/16/2024) for Physical Exam.

## 2024-04-17 ENCOUNTER — Ambulatory Visit: Payer: Self-pay | Admitting: Internal Medicine

## 2024-04-17 ENCOUNTER — Encounter: Payer: Self-pay | Admitting: Internal Medicine

## 2024-04-17 ENCOUNTER — Ambulatory Visit (INDEPENDENT_AMBULATORY_CARE_PROVIDER_SITE_OTHER): Admitting: Internal Medicine

## 2024-04-17 VITALS — BP 126/80 | HR 80 | Temp 98.0°F | Ht 65.0 in | Wt 160.0 lb

## 2024-04-17 DIAGNOSIS — R7303 Prediabetes: Secondary | ICD-10-CM | POA: Diagnosis not present

## 2024-04-17 DIAGNOSIS — M8589 Other specified disorders of bone density and structure, multiple sites: Secondary | ICD-10-CM | POA: Diagnosis not present

## 2024-04-17 DIAGNOSIS — I10 Essential (primary) hypertension: Secondary | ICD-10-CM

## 2024-04-17 DIAGNOSIS — E7800 Pure hypercholesterolemia, unspecified: Secondary | ICD-10-CM

## 2024-04-17 DIAGNOSIS — E210 Primary hyperparathyroidism: Secondary | ICD-10-CM

## 2024-04-17 LAB — COMPREHENSIVE METABOLIC PANEL WITH GFR
ALT: 16 U/L (ref 0–35)
AST: 21 U/L (ref 0–37)
Albumin: 4.3 g/dL (ref 3.5–5.2)
Alkaline Phosphatase: 97 U/L (ref 39–117)
BUN: 19 mg/dL (ref 6–23)
CO2: 27 meq/L (ref 19–32)
Calcium: 11 mg/dL — ABNORMAL HIGH (ref 8.4–10.5)
Chloride: 105 meq/L (ref 96–112)
Creatinine, Ser: 0.77 mg/dL (ref 0.40–1.20)
GFR: 77.65 mL/min (ref >60.00)
Glucose, Bld: 90 mg/dL (ref 70–99)
Potassium: 4.3 meq/L (ref 3.5–5.1)
Sodium: 140 meq/L (ref 135–145)
Total Bilirubin: 0.4 mg/dL (ref 0.2–1.2)
Total Protein: 7.3 g/dL (ref 6.0–8.3)

## 2024-04-17 LAB — LIPID PANEL
Cholesterol: 233 mg/dL — ABNORMAL HIGH (ref 0–200)
HDL: 83.7 mg/dL (ref >39.00)
LDL Cholesterol: 138 mg/dL — ABNORMAL HIGH (ref 0–99)
NonHDL: 148.95
Total CHOL/HDL Ratio: 3
Triglycerides: 54 mg/dL (ref 0.0–149.0)
VLDL: 10.8 mg/dL (ref 0.0–40.0)

## 2024-04-17 LAB — VITAMIN D 25 HYDROXY (VIT D DEFICIENCY, FRACTURES): VITD: 37.78 ng/mL (ref 30.00–100.00)

## 2024-04-17 LAB — HEMOGLOBIN A1C: Hgb A1c MFr Bld: 5.9 % (ref 4.6–6.5)

## 2024-04-17 MED ORDER — LISINOPRIL-HYDROCHLOROTHIAZIDE 20-12.5 MG PO TABS: 1.0000 | ORAL_TABLET | Freq: Every day | ORAL | 1 refills | Status: AC

## 2024-04-17 NOTE — Assessment & Plan Note (Addendum)
 Chronic Regular exercise and healthy diet encouraged Check lipid panel  Currently lifestyle controlled We have discussed her increased risk of CAD in the past-Will see what lipid panel looks like and recommend statin

## 2024-04-17 NOTE — Assessment & Plan Note (Signed)
 Chronic DEXA up to date Taking a MVI Encouraged high calcium diet Stressed regular exercise

## 2024-04-17 NOTE — Assessment & Plan Note (Signed)
 Chronic Lab Results  Component Value Date   HGBA1C 5.7 10/10/2023   Check a1c Low sugar / carb diet Stressed regular exercise

## 2024-04-17 NOTE — Assessment & Plan Note (Addendum)
 Chronic Was following with endocrine, but has not followed up with them recently - referral ordered so they will call her - should f/u Check PTH, calcium Avoid calcium supplements-recently has been taking a beet supplement that does have some calcium in it

## 2024-04-17 NOTE — Assessment & Plan Note (Addendum)
 Chronic Blood pressure not ideally controlled having several high readings at home CMP, cbc Continue diltiazem  120 mg daily, change lisinopril  20 mg daily to lisinopril -hydrochlorothiazide 20-12.5 mg daily Continue to monitor BP at home and update me with her readings

## 2024-04-18 LAB — PTH, INTACT AND CALCIUM
Calcium: 11.4 mg/dL — ABNORMAL HIGH (ref 8.6–10.4)
PTH: 43 pg/mL (ref 16–77)

## 2024-04-20 MED ORDER — ROSUVASTATIN CALCIUM 5 MG PO TABS: 5.0000 mg | ORAL_TABLET | ORAL | 3 refills | Status: AC

## 2024-04-25 ENCOUNTER — Ambulatory Visit: Payer: Medicare HMO

## 2024-04-25 VITALS — BP 120/78 | HR 57 | Ht 64.5 in | Wt 162.2 lb

## 2024-04-25 DIAGNOSIS — Z1212 Encounter for screening for malignant neoplasm of rectum: Secondary | ICD-10-CM

## 2024-04-25 DIAGNOSIS — Z0000 Encounter for general adult medical examination without abnormal findings: Secondary | ICD-10-CM | POA: Diagnosis not present

## 2024-04-25 DIAGNOSIS — Z1211 Encounter for screening for malignant neoplasm of colon: Secondary | ICD-10-CM

## 2024-04-25 NOTE — Patient Instructions (Signed)
 Angie Garcia,  Thank you for taking the time for your Medicare Wellness Visit. I appreciate your continued commitment to your health goals. Please review the care plan we discussed, and feel free to reach out if I can assist you further.  Medicare recommends these wellness visits once per year to help you and your care team stay ahead of potential health issues. These visits are designed to focus on prevention, allowing your provider to concentrate on managing your acute and chronic conditions during your regular appointments.  Please note that Annual Wellness Visits do not include a physical exam. Some assessments may be limited, especially if the visit was conducted virtually. If needed, we may recommend a separate in-person follow-up with your provider.  Ongoing Care Seeing your primary care provider every 3 to 6 months helps us  monitor your health and provide consistent, personalized care. Next office visit on 10/24/2023.    Referrals If a referral was made during today's visit and you haven't received any updates within two weeks, please contact the referred provider directly to check on the status.  Recommended Screenings:  Health Maintenance  Topic Date Due   Pneumococcal Vaccine for age over 53 (1 of 2 - PCV) Never done   Zoster (Shingles) Vaccine (1 of 2) 12/13/1971   COVID-19 Vaccine (5 - 2025-26 season) 02/25/2024   Colon Cancer Screening  05/03/2024   Breast Cancer Screening  08/30/2024   DTaP/Tdap/Td vaccine (3 - Td or Tdap) 02/10/2025   Medicare Annual Wellness Visit  04/25/2025   DEXA scan (bone density measurement)  01/03/2027   Flu Shot  Completed   Hepatitis C Screening  Completed   Meningitis B Vaccine  Aged Out   Hepatitis B Vaccine  Discontinued       04/20/2023    9:51 AM  Advanced Directives  Does Patient Have a Medical Advance Directive? Yes  Type of Estate Agent of [place];Living will  Copy of Healthcare Power of Attorney in Chart? No -  copy requested   Advance Care Planning is important because it: Ensures you receive medical care that aligns with your values, goals, and preferences. Provides guidance to your family and loved ones, reducing the emotional burden of decision-making during critical moments.  Vision: Annual vision screenings are recommended for early detection of glaucoma, cataracts, and diabetic retinopathy. These exams can also reveal signs of chronic conditions such as diabetes and high blood pressure.  Dental: Annual dental screenings help detect early signs of oral cancer, gum disease, and other conditions linked to overall health, including heart disease and diabetes.  Please see the attached documents for additional preventive care recommendations.

## 2024-04-25 NOTE — Progress Notes (Signed)
 Subjective:   Angie Garcia is a 73 y.o. who presents for a Medicare Wellness preventive visit.  As a reminder, Annual Wellness Visits don't include a physical exam, and some assessments may be limited, especially if this visit is performed virtually. We may recommend an in-person follow-up visit with your provider if needed.  Visit Complete: In person  Persons Participating in Visit: Patient.  AWV Questionnaire: Yes: Patient Medicare AWV questionnaire was completed by the patient on 04/22/2024; I have confirmed that all information answered by patient is correct and no changes since this date.  Cardiac Risk Factors include: advanced age (>93men, >71 women);hypertension;dyslipidemia     Objective:    There were no vitals filed for this visit. There is no height or weight on file to calculate BMI.     04/25/2024    1:09 PM 04/20/2023    9:51 AM 02/22/2023    8:54 AM 11/22/2022    1:17 PM 10/06/2022   11:23 AM 04/05/2022    8:49 AM 04/04/2021    8:46 AM  Advanced Directives  Does Patient Have a Medical Advance Directive? Yes Yes Yes No No Yes Yes  Type of Estate Agent of [place];Living will Healthcare Power of [place];Living will Living will   Healthcare Power of [place];Living will Living will;Healthcare Power of Attorney  Does patient want to make changes to medical advance directive?     Yes (MAU/Ambulatory/Procedural Areas - Information given)  No - Patient declined  Copy of Healthcare Power of Attorney in Chart? No - copy requested No - copy requested    No - copy requested No - copy requested  Would patient like information on creating a medical advance directive?    No - Patient declined       Current Medications (verified) Outpatient Encounter Medications as of 04/25/2024  Medication Sig   albuterol  (VENTOLIN  HFA) 108 (90 Base) MCG/ACT inhaler Inhale 1-2 puffs into the lungs every 6 (six) hours as needed for wheezing or shortness of breath.    anastrozole  (ARIMIDEX ) 1 MG tablet TAKE ONE TABLET BY MOUTH ONE TIME DAILY   diltiazem  (CARDIZEM  CD) 120 MG 24 hr capsule TAKE ONE CAPSULE BY MOUTH ONE TIME DAILY   famotidine (PEPCID) 40 MG tablet Take 40 mg by mouth daily.   lisinopril -hydrochlorothiazide (ZESTORETIC) 20-12.5 MG tablet Take 1 tablet by mouth daily.   rosuvastatin (CRESTOR) 5 MG tablet Take 1 tablet (5 mg total) by mouth 3 (three) times a week.   TURMERIC PO Take 1 capsule by mouth daily.   No facility-administered encounter medications on file as of 04/25/2024.    Allergies (verified) Sulfamethoxazole-trimethoprim   History: Past Medical History:  Diagnosis Date   Allergy    Arthritis    Baker's cyst    Left knee   Breast cancer (HCC)    History of radiation therapy    Left breast- 12/05/22-01/22/23-Dr. Lynwood Nasuti   Hx of adenomatous polyp of colon 05/09/2017   Hypertension    Lactose intolerance    Personal history of radiation therapy    Shingles 10/2008   RUE   Past Surgical History:  Procedure Laterality Date   ABDOMINAL HYSTERECTOMY     Dr Elsa for fibroids   BREAST BIOPSY Left 09/18/2022   US  LT BREAST BX W LOC DEV 1ST LESION IMG BX SPEC US  GUIDE 09/18/2022 [facility]   BREAST BIOPSY  10/10/2022   MM LT RADIOACTIVE SEED LOC MAMMO GUIDE 10/10/2022 [facility]   BREAST BIOPSY  Left 10/10/2022   US  LT RADIOACTIVE SEED LOC 10/10/2022 [facility]   BREAST LUMPECTOMY WITH RADIOACTIVE SEED AND SENTINEL LYMPH NODE BIOPSY Left 10/10/2022   Procedure: LEFT BREAST LUMPECTOMY WITH RADIOACTIVE SEED AND AXILLARY SENTINEL LYMPH NODE BIOPSY;  Surgeon: Ebbie Cough, MD;  Location: [facility] OR;  Service: General;  Laterality: Left;   COLONOSCOPY  12/11/2005   lymphoid aggregate removed (suspected polyp was not), melanosis coli   PILONIDAL CYSTECTOMY     spine cyst   RADIOACTIVE SEED GUIDED AXILLARY SENTINEL LYMPH NODE Left 10/10/2022   Procedure: RADIOACTIVE SEED GUIDED LEFT AXILLARY SENTINEL LYMPH  NODE EXCISION;  Surgeon: Ebbie Cough, MD;  Location: [facility] OR;  Service: General;  Laterality: Left;   Family History  Problem Relation Age of Onset   Hypertension Mother    Breast cancer Mother 30   Diabetes Father    Stroke Father        MINI STROKES   Stroke Sister 4   Diabetes Sister    Cancer Maternal Uncle        UNKNOWN TYPE   Colon cancer Neg Hx    Esophageal cancer Neg Hx    Pancreatic cancer Neg Hx    Rectal cancer Neg Hx    Stomach cancer Neg Hx    Hyperparathyroidism Neg Hx    Social History   Socioeconomic History   Marital status: Married    Spouse name: [name]   Number of children: 1   Years of education: Not on file   Highest education level: GED or equivalent  Occupational History   Occupation: retired    Associate Professor: [employer]  Tobacco Use   Smoking status: Former    Current packs/day: 0.00    Types: Cigarettes    Quit date: 06/27/1995    Years since quitting: 28.8   Smokeless tobacco: Never   Tobacco comments:    smoked [age], up to < 1 ppd  Vaping Use   Vaping status: Never Used  Substance and Sexual Activity   Alcohol use: No   Drug use: No   Sexual activity: Yes  Other Topics Concern   Not on file  Social History Narrative   REGULAR EXERCISE   Lives with husband. Has a dog/2025   Social Drivers of Health   Financial Resource Strain: Low Risk  (04/25/2024)   Overall Financial Resource Strain (CARDIA)    Difficulty of Paying Living Expenses: Not hard at all  Food Insecurity: No Food Insecurity (04/25/2024)   Hunger Vital Sign    Worried About Running Out of Food in the Last Year: Never true    Ran Out of Food in the Last Year: Never true  Transportation Needs: No Transportation Needs (04/25/2024)   PRAPARE - Administrator, Civil Service (Medical): No    Lack of Transportation (Non-Medical): No  Physical Activity: Insufficiently Active (04/25/2024)   Exercise Vital Sign    Days of Exercise per Week: 3 days     Minutes of Exercise per Session: 30 min  Stress: No Stress Concern Present (04/25/2024)   [employer] of Occupational Health - Occupational Stress Questionnaire    Feeling of Stress: Not at all  Social Connections: Moderately Integrated (04/25/2024)   Social Connection and Isolation Panel    Frequency of Communication with Friends and Family: Once a week    Frequency of Social Gatherings with Friends and Family: Once a week    Attends Religious Services: More than 4 times per year  Active Member of Clubs or Organizations: Yes    Attends Banker Meetings: More than 4 times per year    Marital Status: Married    Tobacco Counseling Counseling given: Not Answered Tobacco comments: smoked [age], up to < 1 ppd    Clinical Intake:  Pre-visit preparation completed: Yes  Pain : No/denies pain     Nutritional Risks: None Diabetes: No  Lab Results  Component Value Date   HGBA1C 5.9 04/17/2024   HGBA1C 5.7 10/10/2023   HGBA1C 5.7 04/20/2023     How often do you need to have someone help you when you read instructions, pamphlets, or other written materials from your doctor or pharmacy?: 1 - Never  Interpreter Needed?: No  Information entered by :: Athalee Esterline, RMA   Activities of Daily Living     04/22/2024   11:01 AM  In your present state of health, do you have any difficulty performing the following activities:  Hearing? 0  Vision? 0  Difficulty concentrating or making decisions? 0  Walking or climbing stairs? 0  Dressing or bathing? 0  Doing errands, shopping? 0  Preparing Food and eating ? N  Using the Toilet? N  In the past six months, have you accidently leaked urine? N  Do you have problems with loss of bowel control? N  Managing your Medications? N  Managing your Finances? N  Housekeeping or managing your Housekeeping? N    Patient Care Team: [name], MD as PCP - General (Internal Medicine) Kassie Mallick, MD (Inactive) as  Consulting Physician (Endocrinology) de Dasie, [place], OD as Consulting Physician (Optometry) [name], MD as Consulting Physician (Hematology and Oncology) Ebbie Cough, MD as Consulting Physician (General Surgery) Shannon Agent, MD as Consulting Physician (Radiation Oncology)  I have updated your Care Teams any recent Medical Services you may have received from other providers in the past year.     Assessment:   This is a routine wellness examination for [place].  Hearing/Vision screen Hearing Screening - Comments:: Denies hearing difficulties   Vision Screening - Comments:: Wears eyeglasses/ Dr. Allen/per pt-up to date   Goals Addressed             This Visit's Progress    My goal is to eat healthy and increase my water intake.   On track      Depression Screen     04/25/2024    1:12 PM 10/10/2023    9:00 AM 10/10/2023    8:59 AM 04/20/2023    9:56 AM 11/22/2022    1:24 PM 04/05/2022    8:41 AM 04/04/2021    8:48 AM  PHQ 2/9 Scores  PHQ - 2 Score 0 0 0 0 0 0 0  PHQ- 9 Score 0 0  0       Fall Risk     04/22/2024   11:01 AM 10/10/2023    8:59 AM 04/16/2023    1:48 PM 04/05/2022    8:42 AM 04/04/2021    8:47 AM  Fall Risk   Falls in the past year? 0 0 0 0 0  Number falls in past yr: 0 0 0 0 0  Injury with Fall? 0 0 0 0 0  Risk for fall due to :  No Fall Risks  No Fall Risks No Fall Risks  Follow up Falls evaluation completed;Falls prevention discussed Falls evaluation completed Falls evaluation completed;Follow up appointment Falls prevention discussed  Falls evaluation completed  Data saved with a previous flowsheet row definition    MEDICARE RISK AT HOME:  Medicare Risk at Home Any stairs in or around the home?: (Patient-Rptd) Yes If so, are there any without handrails?: (Patient-Rptd) No Home free of loose throw rugs in walkways, pet beds, electrical cords, etc?: (Patient-Rptd) No Adequate lighting in your home to reduce risk of  falls?: (Patient-Rptd) No Life alert?: (Patient-Rptd) No Use of a cane, walker or w/c?: (Patient-Rptd) Yes Grab bars in the bathroom?: (Patient-Rptd) Yes Shower chair or bench in shower?: (Patient-Rptd) Yes Elevated toilet seat or a handicapped toilet?: (Patient-Rptd) Yes  TIMED UP AND GO:  Was the test performed?  Yes  Length of time to ambulate 10 feet: 10 sec Gait steady and fast without use of assistive device  Cognitive Function: 6CIT completed        04/20/2023    9:55 AM 04/20/2023    9:52 AM 04/05/2022    8:43 AM  6CIT Screen  What Year? 0 points 0 points 0 points  What month? 0 points 0 points 0 points  What time?  0 points 0 points  Count back from 20  0 points 0 points  Months in reverse  0 points 0 points  Repeat phrase  0 points 0 points  Total Score  0 points 0 points    Immunizations Immunization History  Administered Date(s) Administered   Fluad Quad(high Dose 65+) 05/17/2019, 04/08/2020, 03/06/2022, 04/06/2023   Hepatitis B 08/03/1987, 12/27/1987   INFLUENZA, HIGH DOSE SEASONAL PF 04/01/2021   Influenza,inj,Quad PF,6-35 Mos 04/02/2018   Influenza-Unspecified 03/22/2015, 03/12/2017, 03/10/2022, 04/02/2024   Moderna Sars-Covid-2 Vaccination 04/30/2020   PFIZER(Purple Top)SARS-COV-2 Vaccination 08/17/2019, 09/10/2019   Pfizer(Comirnaty)Fall Seasonal Vaccine 12 years and older 04/06/2023   Td 06/26/2001   Tdap 02/11/2015   Zoster, Live 04/11/2013    Screening Tests Health Maintenance  Topic Date Due   Pneumococcal Vaccine: 50+ Years (1 of 2 - PCV) Never done   Zoster Vaccines- Shingrix (1 of 2) 12/13/1971   COVID-19 Vaccine (5 - 2025-26 season) 02/25/2024   Colonoscopy  05/03/2024   Mammogram  08/30/2024   DTaP/Tdap/Td (3 - Td or Tdap) 02/10/2025   Medicare Annual Wellness (AWV)  04/25/2025   DEXA SCAN  01/03/2027   Influenza Vaccine  Completed   Hepatitis C Screening  Completed   Meningococcal B Vaccine  Aged Out   Hepatitis B Vaccines 19-59  Average Risk  Discontinued    Health Maintenance Items Addressed: Referral sent to GI for colonoscopy, See Nurse Notes at the end of this note  Additional Screening:  Vision Screening: Recommended annual ophthalmology exams for early detection of glaucoma and other disorders of the eye. Is the patient up to date with their annual eye exam?  Yes  Who is the provider or what is the name of the office in which the patient attends annual eye exams? Dr. Everitt Allen/Patient is up to date  Dental Screening: Recommended annual dental exams for proper oral hygiene  Community Resource Referral / Chronic Care Management: CRR required this visit?  No   CCM required this visit?  No   Plan:    I have personally reviewed and noted the following in the patient's chart:   Medical and social history Use of alcohol, tobacco or illicit drugs  Current medications and supplements including opioid prescriptions. Patient is not currently taking opioid prescriptions. Functional ability and status Nutritional status Physical activity Advanced directives List of other physicians Hospitalizations, surgeries, and ER visits  in previous 12 months Vitals Screenings to include cognitive, depression, and falls Referrals and appointments  In addition, I have reviewed and discussed with patient certain preventive protocols, quality metrics, and best practice recommendations. A written personalized care plan for preventive services as well as general preventive health recommendations were provided to patient.   Timouthy Gilardi L Yoniel Arkwright, CMA   04/25/2024   After Visit Summary: (MyChart) Due to this being a telephonic visit, the after visit summary with patients personalized plan was offered to patient via MyChart   Notes: Patient declines the pneumonia vaccine and the Shingrix vaccine.  Patient was informed to call and get scheduled for a colonoscopy,as order has been placed today.  She had no other concerns to address  today.

## 2024-05-09 ENCOUNTER — Ambulatory Visit: Attending: Hematology and Oncology | Admitting: Rehabilitation

## 2024-05-09 ENCOUNTER — Telehealth: Payer: Self-pay | Admitting: Hematology and Oncology

## 2024-05-09 ENCOUNTER — Encounter: Payer: Self-pay | Admitting: Rehabilitation

## 2024-05-09 DIAGNOSIS — Z170 Estrogen receptor positive status [ER+]: Secondary | ICD-10-CM | POA: Insufficient documentation

## 2024-05-09 DIAGNOSIS — Z483 Aftercare following surgery for neoplasm: Secondary | ICD-10-CM | POA: Insufficient documentation

## 2024-05-09 DIAGNOSIS — C50212 Malignant neoplasm of upper-inner quadrant of left female breast: Secondary | ICD-10-CM | POA: Insufficient documentation

## 2024-05-09 NOTE — Telephone Encounter (Signed)
 I left voicemail for patient to reschedule appointment from 05/26/2024 to 06/05/2024. I advised patient to return call if time/date doesn't work for her.

## 2024-05-09 NOTE — Therapy (Signed)
 OUTPATIENT PHYSICAL THERAPY SOZO SCREENING NOTE   Patient Name: Angie Garcia MRN: [id number] DOB:04/20/1951, 73 y.o., female Today's Date: 05/09/2024  PCP: [name], MD REFERRING PROVIDER: [name], MD   PT End of Session - 05/09/24 1204     Visit Number 2   screen   PT Start Time 1058    PT Stop Time 1101    PT Time Calculation (min) 3 min    Activity Tolerance Patient tolerated treatment well    Behavior During Therapy WFL for tasks assessed/performed          Past Medical History:  Diagnosis Date   Allergy    Arthritis    Baker's cyst    Left knee   Breast cancer (HCC)    History of radiation therapy    Left breast- 12/05/22-01/22/23-Dr. Lynwood Nasuti   Hx of adenomatous polyp of colon 05/09/2017   Hypertension    Lactose intolerance    Personal history of radiation therapy    Shingles 10/2008   RUE   Past Surgical History:  Procedure Laterality Date   ABDOMINAL HYSTERECTOMY     Dr Elsa for fibroids   BREAST BIOPSY Left 09/18/2022   US  LT BREAST BX W LOC DEV 1ST LESION IMG BX SPEC US  GUIDE 09/18/2022 [facility]   BREAST BIOPSY  10/10/2022   MM LT RADIOACTIVE SEED LOC MAMMO GUIDE 10/10/2022 [facility]   BREAST BIOPSY Left 10/10/2022   US  LT RADIOACTIVE SEED LOC 10/10/2022 [facility]   BREAST LUMPECTOMY WITH RADIOACTIVE SEED AND SENTINEL LYMPH NODE BIOPSY Left 10/10/2022   Procedure: LEFT BREAST LUMPECTOMY WITH RADIOACTIVE SEED AND AXILLARY SENTINEL LYMPH NODE BIOPSY;  Surgeon: Ebbie Cough, MD;  Location: [facility] OR;  Service: General;  Laterality: Left;   COLONOSCOPY  12/11/2005   lymphoid aggregate removed (suspected polyp was not), melanosis coli   PILONIDAL CYSTECTOMY     spine cyst   RADIOACTIVE SEED GUIDED AXILLARY SENTINEL LYMPH NODE Left 10/10/2022   Procedure: RADIOACTIVE SEED GUIDED LEFT AXILLARY SENTINEL LYMPH NODE EXCISION;  Surgeon: Ebbie Cough, MD;  Location: [facility] OR;  Service: General;  Laterality: Left;    Patient Active Problem List   Diagnosis Date Noted   Skin lump of leg, right 10/10/2023   Malignant neoplasm of upper-inner quadrant of left breast in female, estrogen receptor positive (HCC) 09/25/2022   Osteopenia 04/10/2021   Hyperparathyroidism, primary 03/14/2018   Multinodular goiter 03/14/2018   Hx of adenomatous polyp of colon 05/09/2017   Cyst of left kidney 10/21/2015   Prediabetes 08/19/2015   Hypercholesteremia 06/02/2014   DJD (degenerative joint disease) of knee 04/01/2013   Hemorrhoids, internal, with bleeding 07/04/2011   LACTOSE INTOLERANCE 11/18/2009   Allergic rhinitis 11/18/2009   History of herpes zoster 10/28/2008   Essential hypertension 01/24/2008    REFERRING DIAG: left breast cancer at risk for lymphedema  THERAPY DIAG:  Malignant neoplasm of upper-inner quadrant of left breast in female, estrogen receptor positive (HCC)  Aftercare following surgery for neoplasm  PERTINENT HISTORY: Lt lumpectomy due to IDC ER/PR positive breast cancer on 10/10/22. 1/6 positive nodes removed. Oncotype 17 and will not be getting chemotherapy  PRECAUTIONS: left UE Lymphedema risk, None  SUBJECTIVE: Pt returns for her 3 month L-Dex screen.   PAIN:  Are you having pain? No  SOZO SCREENING: Patient was assessed today using the SOZO machine to determine the lymphedema index score. This was compared to her baseline score. It was determined that she is within the recommended  range when compared to her baseline and no further action is needed at this time. She will continue SOZO screenings. These are done every 3 months for 2 years post operatively followed by every 6 months for 2 years, and then annually. Answered pts questions about SOZO.    L-DEX FLOWSHEETS - 05/09/24 1200       L-DEX LYMPHEDEMA SCREENING   Measurement Type Unilateral    L-DEX MEASUREMENT EXTREMITY Upper Extremity    POSITION  Standing    DOMINANT SIDE Right    At Risk Side Left    BASELINE SCORE  (UNILATERAL) 0.9    L-DEX SCORE (UNILATERAL) 0.9    VALUE CHANGE (UNILAT) 0          P: Cont every 3 month SOZO until April 2026, then transition to every 6 months x 2 years.    [name], PT 05/09/2024, 12:05 PM

## 2024-05-26 ENCOUNTER — Ambulatory Visit: Admitting: Hematology and Oncology

## 2024-06-04 ENCOUNTER — Telehealth: Payer: Self-pay

## 2024-06-04 NOTE — Telephone Encounter (Signed)
 Left message for patient about upcoming appointment on 12/11

## 2024-06-05 ENCOUNTER — Inpatient Hospital Stay: Attending: Hematology and Oncology | Admitting: Hematology and Oncology

## 2024-06-05 ENCOUNTER — Other Ambulatory Visit

## 2024-06-05 ENCOUNTER — Ambulatory Visit: Payer: Self-pay | Admitting: Internal Medicine

## 2024-06-05 VITALS — BP 127/59 | HR 72 | Temp 97.7°F | Resp 18 | Wt 167.4 lb

## 2024-06-05 DIAGNOSIS — C50212 Malignant neoplasm of upper-inner quadrant of left female breast: Secondary | ICD-10-CM | POA: Insufficient documentation

## 2024-06-05 DIAGNOSIS — Z79811 Long term (current) use of aromatase inhibitors: Secondary | ICD-10-CM | POA: Diagnosis not present

## 2024-06-05 DIAGNOSIS — Z1721 Progesterone receptor positive status: Secondary | ICD-10-CM | POA: Insufficient documentation

## 2024-06-05 DIAGNOSIS — Z90710 Acquired absence of both cervix and uterus: Secondary | ICD-10-CM | POA: Diagnosis not present

## 2024-06-05 DIAGNOSIS — Z170 Estrogen receptor positive status [ER+]: Secondary | ICD-10-CM

## 2024-06-05 DIAGNOSIS — Z87891 Personal history of nicotine dependence: Secondary | ICD-10-CM | POA: Diagnosis not present

## 2024-06-05 DIAGNOSIS — I10 Essential (primary) hypertension: Secondary | ICD-10-CM | POA: Diagnosis not present

## 2024-06-05 DIAGNOSIS — Z923 Personal history of irradiation: Secondary | ICD-10-CM | POA: Diagnosis not present

## 2024-06-05 DIAGNOSIS — Z1732 Human epidermal growth factor receptor 2 negative status: Secondary | ICD-10-CM | POA: Diagnosis not present

## 2024-06-05 DIAGNOSIS — E7800 Pure hypercholesterolemia, unspecified: Secondary | ICD-10-CM

## 2024-06-05 DIAGNOSIS — Z803 Family history of malignant neoplasm of breast: Secondary | ICD-10-CM | POA: Insufficient documentation

## 2024-06-05 DIAGNOSIS — M8580 Other specified disorders of bone density and structure, unspecified site: Secondary | ICD-10-CM | POA: Insufficient documentation

## 2024-06-05 LAB — LIPID PANEL
Cholesterol: 184 mg/dL (ref 0–200)
HDL: 74.9 mg/dL (ref >39.00)
LDL Cholesterol: 98 mg/dL (ref 0–99)
NonHDL: 109.58
Total CHOL/HDL Ratio: 2
Triglycerides: 58 mg/dL (ref 0.0–149.0)
VLDL: 11.6 mg/dL (ref 0.0–40.0)

## 2024-06-05 LAB — HEPATIC FUNCTION PANEL
ALT: 20 U/L (ref 0–35)
AST: 23 U/L (ref 0–37)
Albumin: 4 g/dL (ref 3.5–5.2)
Alkaline Phosphatase: 84 U/L (ref 39–117)
Bilirubin, Direct: 0.1 mg/dL (ref 0.0–0.3)
Total Bilirubin: 0.4 mg/dL (ref 0.2–1.2)
Total Protein: 6.6 g/dL (ref 6.0–8.3)

## 2024-06-05 NOTE — Progress Notes (Signed)
 [facility] Follow up:    Angie Glade PARAS, MD [address] [place] [address]   DIAGNOSIS:  Cancer Staging  Malignant neoplasm of upper-inner quadrant of left breast in female, estrogen receptor positive (HCC) Staging form: Breast, AJCC 8th Edition - Pathologic stage from 01/18/2023: Stage IB (pT1c, pN1, cM0, G3, ER+, PR+, HER2-) - Signed by Angie Ash, MD on 01/18/2023 Stage prefix: Initial diagnosis Histologic grading system: 3 grade system    SUMMARY OF ONCOLOGIC HISTORY: Oncology History  Malignant neoplasm of upper-inner quadrant of left breast in female, estrogen receptor positive (HCC)  08/29/2022 Mammogram   Bilateral screening mammogram showed possible mass in the left breast warranting further evaluation.  No suspicious findings for malignancy in the right breast.  Diagnostic mammogram confirmed a suspicious mass in the left breast at 9:00 measuring 1.8 cm, suspicious left axillary lymph node.  Ultrasound confirmed the above-mentioned findings.   09/18/2022 Pathology Results   Pathology from the left breast needle core biopsy at 9:00 2 cm from nipple showed overall grade 2 invasive ductal carcinoma, prognostic showed ER 100% positive strong staining PR 75% positive moderate to strong staining Ki-67 of 2% and HER2 negative.  On the lymph node biopsy there is a comment with states that the nuclear features are similar but there is obvious ductular differentiation which is different compared to the primary specimen hence prognostics were also repeated on the lymph node which once again confirmed ER 100% positive strong staining PR 30% positive moderate to strong staining Ki-67 of 5% and group 5 HER2 negative   09/25/2022 Initial Diagnosis   Malignant neoplasm of upper-inner quadrant of left breast in female, estrogen receptor positive   12/05/2022 - 01/22/2023 Radiation Therapy   Site/dose:   1) Left breast - 50.4 Gy delivered in 28 Fx at 1.8 Gy/Fx 2)  Left breast_SCV - 50.4 Gy delivered in 28 Fx at 1.8 Gy/Fx 3) Left breast boost - 12 Gy delivered in 6 Fx at 2 Gy/Fx   01/18/2023 Cancer Staging   Staging form: Breast, AJCC 8th Edition - Pathologic stage from 01/18/2023: Stage IB (pT1c, pN1, cM0, G3, ER+, PR+, HER2-) - Signed by Angie Ash, MD on 01/18/2023 Stage prefix: Initial diagnosis Histologic grading system: 3 grade system   01/2023 -  Anti-estrogen oral therapy   Anastrozole  x 5 years     CURRENT THERAPY: anastrozole   INTERVAL HISTORY:  Discussed the use of AI scribe software for clinical note transcription with the patient, who gave verbal consent to proceed.  History of Present Illness   Angie Garcia is a 73 year old female with estrogen receptor positive breast cancer on adjuvant anastrozole  who presents for routine oncology follow-up.  She continues anastrozole  therapy and has acclimated to the medication, reporting only occasional mild hot flashes characterized by intermittent warmth, which are not bothersome. She does not report new breast pain or nipple discharge. She is aware of scar tissue from prior breast surgery.  She is scheduled for her next surveillance mammogram in March and has not pursued additional blood-based surveillance testing for breast cancer.  Bone density testing in June revealed osteopenia. She takes vitamin D  supplementation but avoids calcium  due to previously elevated serum calcium  levels.  Hypertension is managed with a recently adjusted regimen, resulting in well-controlled blood pressure, most recently 127/59 mmHg. She does not report chronic fatigue. She remains active caring for her granddaughter.  Rest of the pertinent 10 point ROS reviewed and neg.   Patient Active  Problem List   Diagnosis Date Noted   Skin lump of leg, right 10/10/2023   Malignant neoplasm of upper-inner quadrant of left breast in female, estrogen receptor positive (HCC) 09/25/2022   Osteopenia 04/10/2021    Hyperparathyroidism, primary 03/14/2018   Multinodular goiter 03/14/2018   Hx of adenomatous polyp of colon 05/09/2017   Cyst of left kidney 10/21/2015   Prediabetes 08/19/2015   Hypercholesteremia 06/02/2014   DJD (degenerative joint disease) of knee 04/01/2013   Hemorrhoids, internal, with bleeding 07/04/2011   LACTOSE INTOLERANCE 11/18/2009   Allergic rhinitis 11/18/2009   History of herpes zoster 10/28/2008   Essential hypertension 01/24/2008    is allergic to sulfamethoxazole-trimethoprim.  MEDICAL HISTORY: Past Medical History:  Diagnosis Date   Allergy    Arthritis    Baker's cyst    Left knee   Breast cancer (HCC)    History of radiation therapy    Left breast- 12/05/22-01/22/23-Dr. Lynwood Nasuti   Hx of adenomatous polyp of colon 05/09/2017   Hypertension    Lactose intolerance    Personal history of radiation therapy    Shingles 10/2008   RUE    SURGICAL HISTORY: Past Surgical History:  Procedure Laterality Date   ABDOMINAL HYSTERECTOMY     Dr Elsa for fibroids   BREAST BIOPSY Left 09/18/2022   US  LT BREAST BX W LOC DEV 1ST LESION IMG BX SPEC US  GUIDE 09/18/2022 [facility]   BREAST BIOPSY  10/10/2022   MM LT RADIOACTIVE SEED LOC MAMMO GUIDE 10/10/2022 [facility]   BREAST BIOPSY Left 10/10/2022   US  LT RADIOACTIVE SEED LOC 10/10/2022 [facility]   BREAST LUMPECTOMY WITH RADIOACTIVE SEED AND SENTINEL LYMPH NODE BIOPSY Left 10/10/2022   Procedure: LEFT BREAST LUMPECTOMY WITH RADIOACTIVE SEED AND AXILLARY SENTINEL LYMPH NODE BIOPSY;  Surgeon: Ebbie Cough, MD;  Location: [facility] OR;  Service: General;  Laterality: Left;   COLONOSCOPY  12/11/2005   lymphoid aggregate removed (suspected polyp was not), melanosis coli   PILONIDAL CYSTECTOMY     spine cyst   RADIOACTIVE SEED GUIDED AXILLARY SENTINEL LYMPH NODE Left 10/10/2022   Procedure: RADIOACTIVE SEED GUIDED LEFT AXILLARY SENTINEL LYMPH NODE EXCISION;  Surgeon: Ebbie Cough, MD;   Location: [facility] OR;  Service: General;  Laterality: Left;    SOCIAL HISTORY: Social History   Socioeconomic History   Marital status: Married    Spouse name: Angie Garcia   Number of children: 1   Years of education: Not on file   Highest education level: GED or equivalent  Occupational History   Occupation: retired    Associate Professor: [employer]  Tobacco Use   Smoking status: Former    Current packs/day: 0.00    Types: Cigarettes    Quit date: 06/27/1995    Years since quitting: 28.9   Smokeless tobacco: Never   Tobacco comments:    smoked [age], up to < 1 ppd  Vaping Use   Vaping status: Never Used  Substance and Sexual Activity   Alcohol use: No   Drug use: No   Sexual activity: Yes  Other Topics Concern   Not on file  Social History Narrative   REGULAR EXERCISE   Lives with husband. Has a dog/2025   Social Drivers of Health   Tobacco Use: Medium Risk (05/09/2024)   Patient History    Smoking Tobacco Use: Former    Smokeless Tobacco Use: Never    Passive Exposure: Not on file  Financial Resource Strain: Low Risk (04/25/2024)   Overall Physicist, Medical  Strain (CARDIA)    Difficulty of Paying Living Expenses: Not hard at all  Food Insecurity: No Food Insecurity (04/25/2024)   Epic    Worried About Programme Researcher, Broadcasting/film/video in the Last Year: Never true    Ran Out of Food in the Last Year: Never true  Transportation Needs: No Transportation Needs (04/25/2024)   Epic    Lack of Transportation (Medical): No    Lack of Transportation (Non-Medical): No  Physical Activity: Insufficiently Active (04/25/2024)   Exercise Vital Sign    Days of Exercise per Week: 3 days    Minutes of Exercise per Session: 30 min  Stress: No Stress Concern Present (04/25/2024)   [employer] of Occupational Health - Occupational Stress Questionnaire    Feeling of Stress: Not at all  Social Connections: Moderately Integrated (04/25/2024)   Social Connection and Isolation Panel    Frequency of  Communication with Friends and Family: Once a week    Frequency of Social Gatherings with Friends and Family: Once a week    Attends Religious Services: More than 4 times per year    Active Member of [employer] or Organizations: Yes    Attends Banker Meetings: More than 4 times per year    Marital Status: Married  Catering Manager Violence: Not At Risk (04/25/2024)   Epic    Fear of Current or Ex-Partner: No    Emotionally Abused: No    Physically Abused: No    Sexually Abused: No  Depression (PHQ2-9): Low Risk (04/25/2024)   Depression (PHQ2-9)    PHQ-2 Score: 0  Alcohol Screen: Low Risk (04/05/2022)   Alcohol Screen    Last Alcohol Screening Score (AUDIT): 0  Housing: Unknown (04/25/2024)   Epic    Unable to Pay for Housing in the Last Year: No    Number of Times Moved in the Last Year: Not on file    Homeless in the Last Year: No  Utilities: Not At Risk (04/25/2024)   Epic    Threatened with loss of utilities: No  Health Literacy: Adequate Health Literacy (04/25/2024)   B1300 Health Literacy    Frequency of need for help with medical instructions: Never    FAMILY HISTORY: Family History  Problem Relation Age of Onset   Hypertension Mother    Breast cancer Mother 46   Diabetes Father    Stroke Father        MINI STROKES   Stroke Sister 28   Diabetes Sister    Cancer Maternal Uncle        UNKNOWN TYPE   Colon cancer Neg Hx    Esophageal cancer Neg Hx    Pancreatic cancer Neg Hx    Rectal cancer Neg Hx    Stomach cancer Neg Hx    Hyperparathyroidism Neg Hx     Review of Systems  Constitutional:  Negative for appetite change, chills, fatigue, fever and unexpected weight change.  HENT:   Negative for hearing loss, lump/mass and trouble swallowing.   Eyes:  Negative for eye problems and icterus.  Respiratory:  Negative for chest tightness, cough and shortness of breath.   Cardiovascular:  Negative for chest pain, leg swelling and palpitations.   Gastrointestinal:  Negative for abdominal distention, abdominal pain, constipation, diarrhea, nausea and vomiting.  Endocrine: Negative for hot flashes.  Genitourinary:  Negative for difficulty urinating.   Musculoskeletal:  Negative for arthralgias.  Skin:  Negative for itching and rash.  Neurological:  Negative for dizziness, extremity  weakness, headaches and numbness.  Hematological:  Negative for adenopathy. Does not bruise/bleed easily.  Psychiatric/Behavioral:  Negative for depression. The patient is not nervous/anxious.       PHYSICAL EXAMINATION    There were no vitals filed for this visit.   Physical Exam Constitutional:      General: She is not in acute distress.    Appearance: Normal appearance. She is not toxic-appearing.  HENT:     Head: Normocephalic and atraumatic.     Mouth/Throat:     Mouth: Mucous membranes are moist.     Pharynx: Oropharynx is clear. No oropharyngeal exudate or posterior oropharyngeal erythema.  Eyes:     General: No scleral icterus. Cardiovascular:     Rate and Rhythm: Normal rate and regular rhythm.     Pulses: Normal pulses.     Heart sounds: Normal heart sounds.  Pulmonary:     Effort: Pulmonary effort is normal.     Breath sounds: Normal breath sounds.  Chest:     Comments: Left breast s/p lumpectomy and radiation, no sign of local recurrence, right breast benign Abdominal:     General: Abdomen is flat. Bowel sounds are normal. There is no distension.     Palpations: Abdomen is soft.     Tenderness: There is no abdominal tenderness.  Musculoskeletal:        General: No swelling.     Cervical back: Neck supple.  Lymphadenopathy:     Cervical: No cervical adenopathy.     Upper Body:     Right upper body: No supraclavicular or axillary adenopathy.     Left upper body: No supraclavicular or axillary adenopathy.  Skin:    General: Skin is warm and dry.     Findings: No rash.  Neurological:     General: No focal deficit  present.     Mental Status: She is alert.  Psychiatric:        Mood and Affect: Mood normal.        Behavior: Behavior normal.     LABORATORY DATA:  CBC    Component Value Date/Time   WBC 5.0 10/10/2023 0939   RBC 4.73 10/10/2023 0939   HGB 13.4 10/10/2023 0939   HGB 13.4 09/27/2022 0818   HCT 41.5 10/10/2023 0939   PLT 313.0 10/10/2023 0939   PLT 369 09/27/2022 0818   MCV 87.6 10/10/2023 0939   MCH 28.0 09/27/2022 0818   MCHC 32.4 10/10/2023 0939   RDW 13.8 10/10/2023 0939   LYMPHSABS 1.2 10/10/2023 0939   MONOABS 0.3 10/10/2023 0939   EOSABS 0.1 10/10/2023 0939   BASOSABS 0.0 10/10/2023 0939    CMP     Component Value Date/Time   NA 140 04/17/2024 1046   K 4.3 04/17/2024 1046   CL 105 04/17/2024 1046   CO2 27 04/17/2024 1046   GLUCOSE 90 04/17/2024 1046   BUN 19 04/17/2024 1046   CREATININE 0.77 04/17/2024 1046   CREATININE 0.90 09/27/2022 0818   CALCIUM  11.4 (H) 04/17/2024 1046   CALCIUM  11.0 (H) 04/17/2024 1046   PROT 7.3 04/17/2024 1046   ALBUMIN 4.3 04/17/2024 1046   AST 21 04/17/2024 1046   AST 13 (L) 09/27/2022 0818   ALT 16 04/17/2024 1046   ALT 11 09/27/2022 0818   ALKPHOS 97 04/17/2024 1046   BILITOT 0.4 04/17/2024 1046   BILITOT 0.3 09/27/2022 0818   GFRNONAA >60 09/27/2022 0818     ASSESSMENT and THERAPY PLAN:   Assessment and Plan Assessment &  Plan Stage 1B ERPR positive breast cancer Status post lumpectomy, adjuvant radiation, and ongoing anastrozole  therapy.\\\ Mild hot flashes and no major side effects. Declined additional blood-based surveillance testing. - Ordered surveillance mammogram for March 2026. - Continued anastrozole  therapy. - Deferred blood-based breast cancer surveillance testing per her preference. - Planned follow-up visit in December 2026.  Osteopenia Osteopenia confirmed on bone density testing. Taking vitamin D , avoid calcium  due to prior hypercalcemia. - Recommend continuation of vitamin D  supplementation. -  Encouraged weight-bearing exercise such as walking and hiking.  Hypertension Hypertension well controlled on current regimen with recent blood pressure of 127/59 mmHg. - Continued current antihypertensive regimen.  All questions were answered. The patient knows to call the clinic with any problems, questions or concerns. We can certainly see the patient much sooner if necessary.  Total encounter time:20 minutes*in face-to-face visit time, chart review, lab review, care coordination, order entry, and documentation of the encounter time.   *Total Encounter Time as defined by the Centers for Medicare and Medicaid Services includes, in addition to the face-to-face time of a patient visit (documented in the note above) non-face-to-face time: obtaining and reviewing outside history, ordering and reviewing medications, tests or procedures, care coordination (communications with other health care professionals or caregivers) and documentation in the medical record.

## 2024-06-27 ENCOUNTER — Encounter: Payer: Self-pay | Admitting: *Deleted

## 2024-06-27 NOTE — Progress Notes (Signed)
 SCHAE CANDO                                          MRN: [id number]   06/27/2024   The [facility] Quality Team Specialist reviewed this patient medical record for the purposes of chart review for care gap closure. The following were reviewed: abstraction for care gap closure-controlling blood pressure.    [facility] Quality Team

## 2024-07-12 ENCOUNTER — Encounter: Payer: Self-pay | Admitting: Internal Medicine

## 2024-07-15 ENCOUNTER — Encounter: Payer: Self-pay | Admitting: Internal Medicine

## 2024-08-07 ENCOUNTER — Encounter

## 2024-08-08 ENCOUNTER — Ambulatory Visit

## 2024-08-21 ENCOUNTER — Encounter: Admitting: Internal Medicine

## 2024-09-03 ENCOUNTER — Encounter

## 2024-09-05 ENCOUNTER — Encounter

## 2024-10-23 ENCOUNTER — Encounter: Admitting: Internal Medicine

## 2025-06-05 ENCOUNTER — Inpatient Hospital Stay: Admitting: Hematology and Oncology
